# Patient Record
Sex: Female | Born: 1965
Health system: Southern US, Community
[De-identification: ages and names within clinical notes are randomized; demographics above are authoritative.]

## PROBLEM LIST (undated history)

## (undated) DIAGNOSIS — B20 Human immunodeficiency virus [HIV] disease: Secondary | ICD-10-CM

## (undated) DIAGNOSIS — I10 Essential (primary) hypertension: Secondary | ICD-10-CM

## (undated) DIAGNOSIS — U071 COVID-19: Secondary | ICD-10-CM

## (undated) DIAGNOSIS — I639 Cerebral infarction, unspecified: Secondary | ICD-10-CM

## (undated) DIAGNOSIS — K219 Gastro-esophageal reflux disease without esophagitis: Secondary | ICD-10-CM

## (undated) DIAGNOSIS — B259 Cytomegaloviral disease, unspecified: Secondary | ICD-10-CM

## (undated) DIAGNOSIS — T7840XA Allergy, unspecified, initial encounter: Secondary | ICD-10-CM

## (undated) DIAGNOSIS — H3090 Unspecified chorioretinal inflammation, unspecified eye: Secondary | ICD-10-CM

## (undated) DIAGNOSIS — Z21 Asymptomatic human immunodeficiency virus [HIV] infection status: Secondary | ICD-10-CM

## (undated) DIAGNOSIS — E785 Hyperlipidemia, unspecified: Secondary | ICD-10-CM

## (undated) HISTORY — PX: OTHER SURGICAL HISTORY: SHX169

## (undated) HISTORY — PX: NOSE SURGERY: SHX723

## (undated) HISTORY — DX: Cytomegaloviral disease, unspecified: B25.9

## (undated) HISTORY — DX: COVID-19: U07.1

## (undated) HISTORY — DX: Gastro-esophageal reflux disease without esophagitis: K21.9

## (undated) HISTORY — PX: FRACTURE SURGERY: SHX138

## (undated) HISTORY — DX: Human immunodeficiency virus (HIV) disease: B20

## (undated) HISTORY — DX: Allergy, unspecified, initial encounter: T78.40XA

## (undated) HISTORY — PX: MANDIBLE SURGERY: SHX707

## (undated) HISTORY — DX: Asymptomatic human immunodeficiency virus (hiv) infection status: Z21

## (undated) HISTORY — DX: Essential (primary) hypertension: I10

## (undated) HISTORY — DX: Hyperlipidemia, unspecified: E78.5

## (undated) HISTORY — PX: EYE SURGERY: SHX253

## (undated) HISTORY — DX: Cerebral infarction, unspecified: I63.9

## (undated) HISTORY — DX: Unspecified chorioretinal inflammation, unspecified eye: H30.90

---

## 1994-07-06 ENCOUNTER — Encounter (INDEPENDENT_AMBULATORY_CARE_PROVIDER_SITE_OTHER): Payer: Self-pay | Admitting: *Deleted

## 1994-07-06 LAB — CONVERTED CEMR LAB: CD4 T Cell Abs: 50

## 1997-12-01 ENCOUNTER — Encounter: Admission: RE | Admit: 1997-12-01 | Discharge: 1997-12-01 | Payer: Self-pay | Admitting: Infectious Diseases

## 1997-12-16 ENCOUNTER — Encounter: Admission: RE | Admit: 1997-12-16 | Discharge: 1997-12-16 | Payer: Self-pay | Admitting: Infectious Diseases

## 1998-03-10 ENCOUNTER — Encounter: Admission: RE | Admit: 1998-03-10 | Discharge: 1998-03-10 | Payer: Self-pay | Admitting: Infectious Diseases

## 1998-03-24 ENCOUNTER — Encounter: Admission: RE | Admit: 1998-03-24 | Discharge: 1998-03-24 | Payer: Self-pay | Admitting: Infectious Diseases

## 1998-04-28 ENCOUNTER — Encounter: Admission: RE | Admit: 1998-04-28 | Discharge: 1998-04-28 | Payer: Self-pay | Admitting: Infectious Diseases

## 1998-06-29 ENCOUNTER — Encounter: Admission: RE | Admit: 1998-06-29 | Discharge: 1998-06-29 | Payer: Self-pay | Admitting: Infectious Diseases

## 1998-08-11 ENCOUNTER — Encounter: Admission: RE | Admit: 1998-08-11 | Discharge: 1998-08-11 | Payer: Self-pay | Admitting: Infectious Diseases

## 1998-08-22 ENCOUNTER — Ambulatory Visit (HOSPITAL_COMMUNITY): Admission: RE | Admit: 1998-08-22 | Discharge: 1998-08-23 | Payer: Self-pay | Admitting: Ophthalmology

## 1998-09-26 ENCOUNTER — Ambulatory Visit (HOSPITAL_COMMUNITY): Admission: RE | Admit: 1998-09-26 | Discharge: 1998-09-27 | Payer: Self-pay | Admitting: Ophthalmology

## 1998-12-08 ENCOUNTER — Ambulatory Visit (HOSPITAL_COMMUNITY): Admission: RE | Admit: 1998-12-08 | Discharge: 1998-12-08 | Payer: Self-pay | Admitting: Infectious Diseases

## 1998-12-29 ENCOUNTER — Encounter: Admission: RE | Admit: 1998-12-29 | Discharge: 1998-12-29 | Payer: Self-pay | Admitting: Infectious Diseases

## 1999-02-10 ENCOUNTER — Other Ambulatory Visit: Admission: RE | Admit: 1999-02-10 | Discharge: 1999-02-10 | Payer: Self-pay | Admitting: Obstetrics and Gynecology

## 1999-05-17 ENCOUNTER — Ambulatory Visit (HOSPITAL_COMMUNITY): Admission: RE | Admit: 1999-05-17 | Discharge: 1999-05-17 | Payer: Self-pay | Admitting: Infectious Diseases

## 1999-05-17 ENCOUNTER — Encounter: Admission: RE | Admit: 1999-05-17 | Discharge: 1999-05-17 | Payer: Self-pay | Admitting: Infectious Diseases

## 1999-07-17 ENCOUNTER — Encounter: Admission: RE | Admit: 1999-07-17 | Discharge: 1999-07-17 | Payer: Self-pay | Admitting: Infectious Diseases

## 1999-08-10 ENCOUNTER — Encounter: Admission: RE | Admit: 1999-08-10 | Discharge: 1999-08-10 | Payer: Self-pay | Admitting: Infectious Diseases

## 1999-09-29 ENCOUNTER — Encounter: Payer: Self-pay | Admitting: Infectious Diseases

## 1999-09-29 ENCOUNTER — Ambulatory Visit (HOSPITAL_COMMUNITY): Admission: RE | Admit: 1999-09-29 | Discharge: 1999-09-29 | Payer: Self-pay | Admitting: Infectious Diseases

## 2000-01-09 ENCOUNTER — Encounter: Admission: RE | Admit: 2000-01-09 | Discharge: 2000-01-09 | Payer: Self-pay | Admitting: Infectious Diseases

## 2000-01-09 ENCOUNTER — Ambulatory Visit (HOSPITAL_COMMUNITY): Admission: RE | Admit: 2000-01-09 | Discharge: 2000-01-09 | Payer: Self-pay | Admitting: Infectious Diseases

## 2000-01-25 ENCOUNTER — Encounter: Admission: RE | Admit: 2000-01-25 | Discharge: 2000-01-25 | Payer: Self-pay | Admitting: Infectious Diseases

## 2000-03-08 ENCOUNTER — Other Ambulatory Visit: Admission: RE | Admit: 2000-03-08 | Discharge: 2000-03-08 | Payer: Self-pay | Admitting: *Deleted

## 2000-04-15 ENCOUNTER — Ambulatory Visit (HOSPITAL_COMMUNITY): Admission: RE | Admit: 2000-04-15 | Discharge: 2000-04-15 | Payer: Self-pay | Admitting: *Deleted

## 2000-06-10 ENCOUNTER — Encounter: Payer: Self-pay | Admitting: *Deleted

## 2000-06-10 ENCOUNTER — Ambulatory Visit (HOSPITAL_COMMUNITY): Admission: RE | Admit: 2000-06-10 | Discharge: 2000-06-10 | Payer: Self-pay | Admitting: *Deleted

## 2000-07-08 ENCOUNTER — Encounter: Admission: RE | Admit: 2000-07-08 | Discharge: 2000-07-08 | Payer: Self-pay | Admitting: Infectious Diseases

## 2000-07-09 ENCOUNTER — Ambulatory Visit (HOSPITAL_COMMUNITY): Admission: RE | Admit: 2000-07-09 | Discharge: 2000-07-09 | Payer: Self-pay | Admitting: Infectious Diseases

## 2000-07-09 ENCOUNTER — Encounter: Admission: RE | Admit: 2000-07-09 | Discharge: 2000-07-09 | Payer: Self-pay | Admitting: Infectious Diseases

## 2000-08-22 ENCOUNTER — Encounter: Admission: RE | Admit: 2000-08-22 | Discharge: 2000-08-22 | Payer: Self-pay | Admitting: Infectious Diseases

## 2000-09-06 ENCOUNTER — Ambulatory Visit (HOSPITAL_BASED_OUTPATIENT_CLINIC_OR_DEPARTMENT_OTHER): Admission: RE | Admit: 2000-09-06 | Discharge: 2000-09-06 | Payer: Self-pay | Admitting: Otolaryngology

## 2000-09-06 ENCOUNTER — Encounter (INDEPENDENT_AMBULATORY_CARE_PROVIDER_SITE_OTHER): Payer: Self-pay

## 2000-11-05 ENCOUNTER — Emergency Department (HOSPITAL_COMMUNITY): Admission: EM | Admit: 2000-11-05 | Discharge: 2000-11-05 | Payer: Self-pay | Admitting: Emergency Medicine

## 2000-11-05 ENCOUNTER — Encounter: Payer: Self-pay | Admitting: Emergency Medicine

## 2000-12-16 ENCOUNTER — Encounter: Payer: Self-pay | Admitting: Infectious Diseases

## 2000-12-16 ENCOUNTER — Ambulatory Visit (HOSPITAL_COMMUNITY): Admission: RE | Admit: 2000-12-16 | Discharge: 2000-12-16 | Payer: Self-pay | Admitting: Infectious Diseases

## 2000-12-19 ENCOUNTER — Encounter: Admission: RE | Admit: 2000-12-19 | Discharge: 2000-12-19 | Payer: Self-pay | Admitting: Infectious Diseases

## 2000-12-19 ENCOUNTER — Ambulatory Visit: Admission: RE | Admit: 2000-12-19 | Discharge: 2000-12-19 | Payer: Self-pay | Admitting: Internal Medicine

## 2001-01-02 ENCOUNTER — Encounter: Admission: RE | Admit: 2001-01-02 | Discharge: 2001-01-02 | Payer: Self-pay | Admitting: Infectious Diseases

## 2001-03-10 ENCOUNTER — Other Ambulatory Visit: Admission: RE | Admit: 2001-03-10 | Discharge: 2001-03-10 | Payer: Self-pay | Admitting: *Deleted

## 2001-07-31 ENCOUNTER — Encounter: Admission: RE | Admit: 2001-07-31 | Discharge: 2001-07-31 | Payer: Self-pay | Admitting: Infectious Diseases

## 2001-07-31 ENCOUNTER — Ambulatory Visit (HOSPITAL_COMMUNITY): Admission: RE | Admit: 2001-07-31 | Discharge: 2001-07-31 | Payer: Self-pay | Admitting: Infectious Diseases

## 2001-08-21 ENCOUNTER — Encounter: Admission: RE | Admit: 2001-08-21 | Discharge: 2001-08-21 | Payer: Self-pay | Admitting: Infectious Diseases

## 2001-11-13 ENCOUNTER — Encounter: Admission: RE | Admit: 2001-11-13 | Discharge: 2001-11-13 | Payer: Self-pay | Admitting: Internal Medicine

## 2001-11-13 ENCOUNTER — Ambulatory Visit (HOSPITAL_COMMUNITY): Admission: RE | Admit: 2001-11-13 | Discharge: 2001-11-13 | Payer: Self-pay | Admitting: Infectious Diseases

## 2001-11-27 ENCOUNTER — Encounter: Admission: RE | Admit: 2001-11-27 | Discharge: 2001-11-27 | Payer: Self-pay | Admitting: Infectious Diseases

## 2002-04-02 ENCOUNTER — Ambulatory Visit (HOSPITAL_COMMUNITY): Admission: RE | Admit: 2002-04-02 | Discharge: 2002-04-02 | Payer: Self-pay | Admitting: Infectious Diseases

## 2002-04-02 ENCOUNTER — Encounter: Admission: RE | Admit: 2002-04-02 | Discharge: 2002-04-02 | Payer: Self-pay | Admitting: Infectious Diseases

## 2002-04-16 ENCOUNTER — Encounter: Admission: RE | Admit: 2002-04-16 | Discharge: 2002-04-16 | Payer: Self-pay | Admitting: Infectious Diseases

## 2002-05-14 ENCOUNTER — Encounter: Admission: RE | Admit: 2002-05-14 | Discharge: 2002-05-14 | Payer: Self-pay | Admitting: Infectious Diseases

## 2002-06-11 ENCOUNTER — Other Ambulatory Visit: Admission: RE | Admit: 2002-06-11 | Discharge: 2002-06-11 | Payer: Self-pay | Admitting: *Deleted

## 2002-07-23 ENCOUNTER — Ambulatory Visit (HOSPITAL_COMMUNITY): Admission: RE | Admit: 2002-07-23 | Discharge: 2002-07-23 | Payer: Self-pay | Admitting: Infectious Diseases

## 2002-07-23 ENCOUNTER — Encounter: Admission: RE | Admit: 2002-07-23 | Discharge: 2002-07-23 | Payer: Self-pay | Admitting: Infectious Diseases

## 2002-08-20 ENCOUNTER — Encounter: Admission: RE | Admit: 2002-08-20 | Discharge: 2002-08-20 | Payer: Self-pay | Admitting: Infectious Diseases

## 2003-01-07 ENCOUNTER — Encounter: Payer: Self-pay | Admitting: Infectious Diseases

## 2003-01-07 ENCOUNTER — Encounter: Admission: RE | Admit: 2003-01-07 | Discharge: 2003-01-07 | Payer: Self-pay | Admitting: Infectious Diseases

## 2003-01-21 ENCOUNTER — Encounter: Admission: RE | Admit: 2003-01-21 | Discharge: 2003-01-21 | Payer: Self-pay | Admitting: Infectious Diseases

## 2003-06-07 ENCOUNTER — Other Ambulatory Visit: Admission: RE | Admit: 2003-06-07 | Discharge: 2003-06-07 | Payer: Self-pay | Admitting: Infectious Diseases

## 2003-06-21 ENCOUNTER — Encounter: Admission: RE | Admit: 2003-06-21 | Discharge: 2003-06-21 | Payer: Self-pay | Admitting: Infectious Diseases

## 2003-07-21 ENCOUNTER — Other Ambulatory Visit: Admission: RE | Admit: 2003-07-21 | Discharge: 2003-07-21 | Payer: Self-pay | Admitting: Gynecology

## 2003-09-29 ENCOUNTER — Encounter: Admission: RE | Admit: 2003-09-29 | Discharge: 2003-12-28 | Payer: Self-pay | Admitting: Gynecology

## 2004-01-05 ENCOUNTER — Ambulatory Visit (HOSPITAL_COMMUNITY): Admission: RE | Admit: 2004-01-05 | Discharge: 2004-01-05 | Payer: Self-pay | Admitting: Infectious Diseases

## 2004-01-05 ENCOUNTER — Encounter: Admission: RE | Admit: 2004-01-05 | Discharge: 2004-01-05 | Payer: Self-pay | Admitting: Infectious Diseases

## 2004-01-28 ENCOUNTER — Encounter: Admission: RE | Admit: 2004-01-28 | Discharge: 2004-01-28 | Payer: Self-pay | Admitting: Infectious Diseases

## 2004-05-20 ENCOUNTER — Emergency Department (HOSPITAL_COMMUNITY): Admission: EM | Admit: 2004-05-20 | Discharge: 2004-05-20 | Payer: Self-pay | Admitting: Internal Medicine

## 2004-06-23 ENCOUNTER — Ambulatory Visit (HOSPITAL_COMMUNITY): Admission: RE | Admit: 2004-06-23 | Discharge: 2004-06-23 | Payer: Self-pay | Admitting: Gynecology

## 2004-09-08 ENCOUNTER — Other Ambulatory Visit: Admission: RE | Admit: 2004-09-08 | Discharge: 2004-09-08 | Payer: Self-pay | Admitting: Gynecology

## 2004-09-27 ENCOUNTER — Ambulatory Visit (HOSPITAL_COMMUNITY): Admission: RE | Admit: 2004-09-27 | Discharge: 2004-09-27 | Payer: Self-pay | Admitting: Infectious Diseases

## 2004-09-27 ENCOUNTER — Ambulatory Visit: Payer: Self-pay | Admitting: Infectious Diseases

## 2004-10-12 ENCOUNTER — Ambulatory Visit: Payer: Self-pay | Admitting: Infectious Diseases

## 2005-03-28 ENCOUNTER — Ambulatory Visit (HOSPITAL_COMMUNITY): Admission: RE | Admit: 2005-03-28 | Discharge: 2005-03-28 | Payer: Self-pay | Admitting: Infectious Diseases

## 2005-03-28 ENCOUNTER — Encounter (INDEPENDENT_AMBULATORY_CARE_PROVIDER_SITE_OTHER): Payer: Self-pay | Admitting: *Deleted

## 2005-03-28 ENCOUNTER — Ambulatory Visit: Payer: Self-pay | Admitting: Infectious Diseases

## 2005-04-19 ENCOUNTER — Ambulatory Visit: Payer: Self-pay | Admitting: Infectious Diseases

## 2005-09-12 ENCOUNTER — Other Ambulatory Visit: Admission: RE | Admit: 2005-09-12 | Discharge: 2005-09-12 | Payer: Self-pay | Admitting: Gynecology

## 2005-09-26 ENCOUNTER — Ambulatory Visit: Payer: Self-pay | Admitting: Infectious Diseases

## 2005-09-26 ENCOUNTER — Encounter (INDEPENDENT_AMBULATORY_CARE_PROVIDER_SITE_OTHER): Payer: Self-pay | Admitting: *Deleted

## 2005-09-26 LAB — CONVERTED CEMR LAB
CD4 Count: 520 microliters
HIV 1 RNA Quant: 399 copies/mL

## 2006-04-10 ENCOUNTER — Ambulatory Visit: Payer: Self-pay | Admitting: Infectious Diseases

## 2006-04-10 ENCOUNTER — Encounter (INDEPENDENT_AMBULATORY_CARE_PROVIDER_SITE_OTHER): Payer: Self-pay | Admitting: *Deleted

## 2006-04-10 ENCOUNTER — Encounter: Admission: RE | Admit: 2006-04-10 | Discharge: 2006-04-10 | Payer: Self-pay | Admitting: Infectious Diseases

## 2006-04-25 ENCOUNTER — Ambulatory Visit: Payer: Self-pay | Admitting: Infectious Diseases

## 2006-09-25 ENCOUNTER — Other Ambulatory Visit: Admission: RE | Admit: 2006-09-25 | Discharge: 2006-09-25 | Payer: Self-pay | Admitting: Gynecology

## 2006-09-30 ENCOUNTER — Encounter (INDEPENDENT_AMBULATORY_CARE_PROVIDER_SITE_OTHER): Payer: Self-pay | Admitting: *Deleted

## 2006-09-30 LAB — CONVERTED CEMR LAB

## 2006-10-13 ENCOUNTER — Encounter (INDEPENDENT_AMBULATORY_CARE_PROVIDER_SITE_OTHER): Payer: Self-pay | Admitting: *Deleted

## 2006-10-23 ENCOUNTER — Ambulatory Visit: Payer: Self-pay | Admitting: Infectious Diseases

## 2006-10-23 ENCOUNTER — Encounter: Admission: RE | Admit: 2006-10-23 | Discharge: 2006-10-23 | Payer: Self-pay | Admitting: Infectious Diseases

## 2006-10-23 LAB — CONVERTED CEMR LAB
BUN: 16 mg/dL (ref 6–23)
CO2: 23 meq/L (ref 19–32)
Cholesterol: 190 mg/dL (ref 0–200)
Creatinine, Ser: 0.86 mg/dL (ref 0.40–1.20)
Eosinophils Relative: 3 % (ref 0–5)
Glucose, Bld: 90 mg/dL (ref 70–99)
HCT: 40.3 % (ref 36.0–46.0)
HDL: 62 mg/dL (ref >39)
HIV-1 RNA Quant, Log: 2.03 — ABNORMAL HIGH (ref <1.70)
Hemoglobin: 13.4 g/dL (ref 12.0–15.0)
Lymphocytes Relative: 34 % (ref 12–46)
Lymphs Abs: 1.1 10*3/uL (ref 0.7–3.3)
MCV: 94.6 fL (ref 78.0–100.0)
Monocytes Absolute: 0.3 10*3/uL (ref 0.2–0.7)
Monocytes Relative: 9 % (ref 3–11)
RBC: 4.26 M/uL (ref 3.87–5.11)
Sodium: 139 meq/L (ref 135–145)
Total Bilirubin: 0.4 mg/dL (ref 0.3–1.2)
Total Protein: 7.2 g/dL (ref 6.0–8.3)
Triglycerides: 78 mg/dL (ref <150)
VLDL: 16 mg/dL (ref 0–40)
WBC: 3.4 10*3/uL — ABNORMAL LOW (ref 4.0–10.5)

## 2006-11-07 ENCOUNTER — Ambulatory Visit: Payer: Self-pay | Admitting: Infectious Diseases

## 2006-11-07 DIAGNOSIS — I110 Hypertensive heart disease with heart failure: Secondary | ICD-10-CM

## 2006-11-07 DIAGNOSIS — B20 Human immunodeficiency virus [HIV] disease: Secondary | ICD-10-CM | POA: Insufficient documentation

## 2006-11-07 DIAGNOSIS — I509 Heart failure, unspecified: Secondary | ICD-10-CM | POA: Insufficient documentation

## 2006-11-07 DIAGNOSIS — Z21 Asymptomatic human immunodeficiency virus [HIV] infection status: Secondary | ICD-10-CM | POA: Insufficient documentation

## 2006-11-07 HISTORY — DX: Hypertensive heart disease with heart failure: I50.9

## 2006-11-07 HISTORY — DX: Hypertensive heart disease with heart failure: I11.0

## 2007-04-23 ENCOUNTER — Encounter: Admission: RE | Admit: 2007-04-23 | Discharge: 2007-04-23 | Payer: Self-pay | Admitting: Infectious Diseases

## 2007-04-23 ENCOUNTER — Other Ambulatory Visit: Admission: RE | Admit: 2007-04-23 | Discharge: 2007-04-23 | Payer: Self-pay | Admitting: Gynecology

## 2007-04-23 ENCOUNTER — Ambulatory Visit: Payer: Self-pay | Admitting: Infectious Diseases

## 2007-04-23 LAB — CONVERTED CEMR LAB
ALT: 13 units/L (ref 0–35)
AST: 18 units/L (ref 0–37)
Albumin: 4.4 g/dL (ref 3.5–5.2)
Alkaline Phosphatase: 48 units/L (ref 39–117)
BUN: 20 mg/dL (ref 6–23)
Basophils Absolute: 0 10*3/uL (ref 0.0–0.1)
Basophils Relative: 1 % (ref 0–1)
CO2: 25 meq/L (ref 19–32)
Calcium: 9.1 mg/dL (ref 8.4–10.5)
Chloride: 104 meq/L (ref 96–112)
Creatinine, Ser: 0.84 mg/dL (ref 0.40–1.20)
Eosinophils Absolute: 0.1 10*3/uL (ref 0.0–0.7)
Eosinophils Relative: 2 % (ref 0–5)
Glucose, Bld: 124 mg/dL — ABNORMAL HIGH (ref 70–99)
HCT: 39.5 % (ref 36.0–46.0)
HIV 1 RNA Quant: 68 copies/mL — ABNORMAL HIGH (ref <50)
Hemoglobin: 13.2 g/dL (ref 12.0–15.0)
Lymphocytes Relative: 34 % (ref 12–46)
Lymphs Abs: 1 10*3/uL (ref 0.7–3.3)
MCHC: 33.4 g/dL (ref 30.0–36.0)
MCV: 93.4 fL (ref 78.0–100.0)
Monocytes Absolute: 0.2 10*3/uL (ref 0.2–0.7)
Monocytes Relative: 7 % (ref 3–11)
Neutro Abs: 1.7 10*3/uL (ref 1.7–7.7)
Neutrophils Relative %: 56 % (ref 43–77)
Platelets: 186 10*3/uL (ref 150–400)
Potassium: 4.2 meq/L (ref 3.5–5.3)
RBC: 4.23 M/uL (ref 3.87–5.11)
RDW: 13 % (ref 11.5–14.0)
Sodium: 139 meq/L (ref 135–145)
Total Bilirubin: 0.4 mg/dL (ref 0.3–1.2)
Total Protein: 7.1 g/dL (ref 6.0–8.3)
WBC: 3 10*3/uL — ABNORMAL LOW (ref 4.0–10.5)

## 2007-05-08 ENCOUNTER — Ambulatory Visit: Payer: Self-pay | Admitting: Infectious Diseases

## 2007-06-09 ENCOUNTER — Telehealth: Payer: Self-pay | Admitting: Infectious Diseases

## 2007-09-10 ENCOUNTER — Encounter: Admission: RE | Admit: 2007-09-10 | Discharge: 2007-09-10 | Payer: Self-pay | Admitting: Infectious Diseases

## 2007-09-10 ENCOUNTER — Ambulatory Visit: Payer: Self-pay | Admitting: Infectious Diseases

## 2007-09-10 LAB — CONVERTED CEMR LAB
BUN: 20 mg/dL (ref 6–23)
Basophils Relative: 1 % (ref 0–1)
Chloride: 106 meq/L (ref 96–112)
Glucose, Bld: 85 mg/dL (ref 70–99)
HIV-1 RNA Quant, Log: <1.7 (ref <1.70)
LDL Cholesterol: 111 mg/dL — ABNORMAL HIGH (ref 0–99)
Lymphs Abs: 1.1 10*3/uL (ref 0.7–4.0)
Monocytes Relative: 8 % (ref 3–12)
Neutro Abs: 1.7 10*3/uL (ref 1.7–7.7)
Neutrophils Relative %: 54 % (ref 43–77)
Potassium: 4 meq/L (ref 3.5–5.3)
RBC: 4.2 M/uL (ref 3.87–5.11)
Triglycerides: 98 mg/dL (ref <150)
VLDL: 20 mg/dL (ref 0–40)
WBC: 3.2 10*3/uL — ABNORMAL LOW (ref 4.0–10.5)

## 2007-09-30 ENCOUNTER — Encounter (INDEPENDENT_AMBULATORY_CARE_PROVIDER_SITE_OTHER): Payer: Self-pay | Admitting: *Deleted

## 2007-10-02 ENCOUNTER — Ambulatory Visit: Payer: Self-pay | Admitting: Infectious Diseases

## 2007-10-08 ENCOUNTER — Other Ambulatory Visit: Admission: RE | Admit: 2007-10-08 | Discharge: 2007-10-08 | Payer: Self-pay | Admitting: Gynecology

## 2007-11-25 ENCOUNTER — Ambulatory Visit (HOSPITAL_COMMUNITY): Admission: RE | Admit: 2007-11-25 | Discharge: 2007-11-25 | Payer: Self-pay | Admitting: Gynecology

## 2008-01-12 ENCOUNTER — Telehealth (INDEPENDENT_AMBULATORY_CARE_PROVIDER_SITE_OTHER): Payer: Self-pay | Admitting: *Deleted

## 2008-06-02 ENCOUNTER — Ambulatory Visit: Payer: Self-pay | Admitting: Infectious Diseases

## 2008-06-02 LAB — CONVERTED CEMR LAB
ALT: 16 units/L (ref 0–35)
AST: 20 units/L (ref 0–37)
Alkaline Phosphatase: 43 units/L (ref 39–117)
Basophils Absolute: 0 10*3/uL (ref 0.0–0.1)
Basophils Relative: 1 % (ref 0–1)
CO2: 23 meq/L (ref 19–32)
Eosinophils Absolute: 0.1 10*3/uL (ref 0.0–0.7)
Eosinophils Relative: 4 % (ref 0–5)
HCT: 38.2 % (ref 36.0–46.0)
Lymphocytes Relative: 35 % (ref 12–46)
MCHC: 32.7 g/dL (ref 30.0–36.0)
Platelets: 180 10*3/uL (ref 150–400)
RDW: 13 % (ref 11.5–15.5)
Sodium: 139 meq/L (ref 135–145)
Total Bilirubin: 0.3 mg/dL (ref 0.3–1.2)
Total Protein: 6.6 g/dL (ref 6.0–8.3)

## 2008-06-04 ENCOUNTER — Telehealth (INDEPENDENT_AMBULATORY_CARE_PROVIDER_SITE_OTHER): Payer: Self-pay | Admitting: *Deleted

## 2008-06-17 ENCOUNTER — Ambulatory Visit: Payer: Self-pay | Admitting: Infectious Diseases

## 2008-09-26 ENCOUNTER — Emergency Department (HOSPITAL_COMMUNITY): Admission: EM | Admit: 2008-09-26 | Discharge: 2008-09-26 | Payer: Self-pay | Admitting: Emergency Medicine

## 2008-09-29 ENCOUNTER — Ambulatory Visit (HOSPITAL_BASED_OUTPATIENT_CLINIC_OR_DEPARTMENT_OTHER): Admission: RE | Admit: 2008-09-29 | Discharge: 2008-09-29 | Payer: Self-pay | Admitting: *Deleted

## 2008-10-08 ENCOUNTER — Emergency Department (HOSPITAL_COMMUNITY): Admission: EM | Admit: 2008-10-08 | Discharge: 2008-10-08 | Payer: Self-pay | Admitting: Emergency Medicine

## 2008-11-03 ENCOUNTER — Other Ambulatory Visit: Admission: RE | Admit: 2008-11-03 | Discharge: 2008-11-03 | Payer: Self-pay | Admitting: Gynecology

## 2008-11-03 ENCOUNTER — Encounter (INDEPENDENT_AMBULATORY_CARE_PROVIDER_SITE_OTHER): Payer: Self-pay | Admitting: *Deleted

## 2008-11-03 ENCOUNTER — Encounter: Payer: Self-pay | Admitting: Women's Health

## 2008-11-03 ENCOUNTER — Ambulatory Visit: Payer: Self-pay | Admitting: Women's Health

## 2008-11-03 ENCOUNTER — Encounter: Payer: Self-pay | Admitting: Infectious Diseases

## 2008-11-22 ENCOUNTER — Encounter (INDEPENDENT_AMBULATORY_CARE_PROVIDER_SITE_OTHER): Payer: Self-pay | Admitting: *Deleted

## 2008-11-25 ENCOUNTER — Ambulatory Visit (HOSPITAL_COMMUNITY): Admission: RE | Admit: 2008-11-25 | Discharge: 2008-11-25 | Payer: Self-pay | Admitting: Gynecology

## 2008-12-01 ENCOUNTER — Ambulatory Visit: Payer: Self-pay | Admitting: Infectious Diseases

## 2008-12-01 LAB — CONVERTED CEMR LAB
Albumin: 4.1 g/dL (ref 3.5–5.2)
BUN: 19 mg/dL (ref 6–23)
Basophils Absolute: 0 10*3/uL (ref 0.0–0.1)
Calcium: 8.9 mg/dL (ref 8.4–10.5)
Chloride: 105 meq/L (ref 96–112)
Eosinophils Absolute: 0.1 10*3/uL (ref 0.0–0.7)
Eosinophils Relative: 3 % (ref 0–5)
GFR calc non Af Amer: >60 mL/min (ref >60)
Glucose, Bld: 76 mg/dL (ref 70–99)
HDL: 60 mg/dL (ref >39)
HIV-1 RNA Quant, Log: 1.81 — ABNORMAL HIGH (ref <1.68)
LDL Cholesterol: 111 mg/dL — ABNORMAL HIGH (ref 0–99)
Lymphocytes Relative: 38 % (ref 12–46)
Lymphs Abs: 1.4 10*3/uL (ref 0.7–4.0)
MCV: 96.2 fL (ref 78.0–100.0)
Neutrophils Relative %: 52 % (ref 43–77)
Platelets: 181 10*3/uL (ref 150–400)
Potassium: 4 meq/L (ref 3.5–5.3)
RDW: 12.8 % (ref 11.5–15.5)
Sodium: 139 meq/L (ref 135–145)
Total Protein: 6.7 g/dL (ref 6.0–8.3)
WBC: 3.8 10*3/uL — ABNORMAL LOW (ref 4.0–10.5)

## 2008-12-16 ENCOUNTER — Ambulatory Visit: Payer: Self-pay | Admitting: Infectious Diseases

## 2009-06-13 ENCOUNTER — Telehealth (INDEPENDENT_AMBULATORY_CARE_PROVIDER_SITE_OTHER): Payer: Self-pay | Admitting: *Deleted

## 2009-07-27 ENCOUNTER — Ambulatory Visit: Payer: Self-pay | Admitting: Infectious Diseases

## 2009-07-27 ENCOUNTER — Telehealth: Payer: Self-pay | Admitting: Infectious Diseases

## 2009-07-27 LAB — CONVERTED CEMR LAB
ALT: 16 units/L (ref 0–35)
AST: 19 units/L (ref 0–37)
Albumin: 4.3 g/dL (ref 3.5–5.2)
Alkaline Phosphatase: 38 units/L — ABNORMAL LOW (ref 39–117)
Basophils Absolute: 0 10*3/uL (ref 0.0–0.1)
Basophils Relative: 0 % (ref 0–1)
Eosinophils Absolute: 0.1 10*3/uL (ref 0.0–0.7)
Glucose, Bld: 85 mg/dL (ref 70–99)
HIV 1 RNA Quant: <48 copies/mL (ref <48)
MCHC: 35 g/dL (ref 30.0–36.0)
MCV: 93.7 fL (ref >=78.0)
Monocytes Relative: 10 % (ref 3–12)
Neutro Abs: 1.7 10*3/uL (ref 1.7–7.7)
Neutrophils Relative %: 48 % (ref 43–77)
Platelets: 196 10*3/uL (ref 150–400)
Potassium: 4 meq/L (ref 3.5–5.3)
RDW: 12.7 % (ref 11.5–15.5)
Sodium: 136 meq/L (ref 135–145)
Total Bilirubin: 0.3 mg/dL (ref 0.3–1.2)
Total Protein: 6.7 g/dL (ref 6.0–8.3)

## 2009-08-18 ENCOUNTER — Ambulatory Visit: Payer: Self-pay | Admitting: Infectious Diseases

## 2009-10-19 ENCOUNTER — Telehealth (INDEPENDENT_AMBULATORY_CARE_PROVIDER_SITE_OTHER): Payer: Self-pay | Admitting: *Deleted

## 2009-10-20 ENCOUNTER — Emergency Department (HOSPITAL_COMMUNITY): Admission: EM | Admit: 2009-10-20 | Discharge: 2009-10-20 | Payer: Self-pay | Admitting: Family Medicine

## 2009-11-28 ENCOUNTER — Ambulatory Visit (HOSPITAL_COMMUNITY): Admission: RE | Admit: 2009-11-28 | Discharge: 2009-11-28 | Payer: Self-pay | Admitting: Gynecology

## 2009-11-30 ENCOUNTER — Other Ambulatory Visit: Admission: RE | Admit: 2009-11-30 | Discharge: 2009-11-30 | Payer: Self-pay | Admitting: Gynecology

## 2009-11-30 ENCOUNTER — Ambulatory Visit: Payer: Self-pay | Admitting: Women's Health

## 2010-03-23 ENCOUNTER — Ambulatory Visit (HOSPITAL_BASED_OUTPATIENT_CLINIC_OR_DEPARTMENT_OTHER): Admission: RE | Admit: 2010-03-23 | Discharge: 2010-03-23 | Payer: Self-pay | Admitting: Orthopedic Surgery

## 2010-04-05 ENCOUNTER — Ambulatory Visit: Payer: Self-pay | Admitting: Infectious Diseases

## 2010-04-05 LAB — CONVERTED CEMR LAB
ALT: 9 units/L (ref 0–35)
AST: 16 units/L (ref 0–37)
Alkaline Phosphatase: 38 units/L — ABNORMAL LOW (ref 39–117)
BUN: 17 mg/dL (ref 6–23)
Basophils Absolute: 0 10*3/uL (ref 0.0–0.1)
Basophils Relative: 0 % (ref 0–1)
Creatinine, Ser: 0.89 mg/dL (ref 0.40–1.20)
Eosinophils Absolute: 0.1 10*3/uL (ref 0.0–0.7)
HDL: 58 mg/dL (ref >39)
LDL Cholesterol: 115 mg/dL — ABNORMAL HIGH (ref 0–99)
MCHC: 32.5 g/dL (ref 30.0–36.0)
MCV: 93.5 fL (ref 78.0–100.0)
Monocytes Absolute: 0.3 10*3/uL (ref 0.1–1.0)
Monocytes Relative: 8 % (ref 3–12)
Neutrophils Relative %: 61 % (ref 43–77)
Potassium: 3.8 meq/L (ref 3.5–5.3)
RBC: 3.69 M/uL — ABNORMAL LOW (ref 3.87–5.11)
RDW: 13.7 % (ref 11.5–15.5)
Total CHOL/HDL Ratio: 3.2

## 2010-04-20 ENCOUNTER — Ambulatory Visit: Payer: Self-pay | Admitting: Infectious Diseases

## 2010-05-06 HISTORY — PX: FINGER SURGERY: SHX640

## 2010-06-27 ENCOUNTER — Encounter (INDEPENDENT_AMBULATORY_CARE_PROVIDER_SITE_OTHER): Payer: Self-pay | Admitting: *Deleted

## 2010-08-30 ENCOUNTER — Encounter (INDEPENDENT_AMBULATORY_CARE_PROVIDER_SITE_OTHER): Payer: Self-pay | Admitting: *Deleted

## 2010-09-05 NOTE — Miscellaneous (Signed)
  Clinical Lists Changes  Observations: Added new observation of YEARAIDSPOS: 1995  (06/27/2010 14:56) Added new observation of HIV STATUS: CDC-defined AIDS  (06/27/2010 14:56)

## 2010-09-05 NOTE — Progress Notes (Signed)
Summary: severe sore throat, nightly fever, "cold" symptoms, advised UC  Phone Note Call from Patient Call back at Home Phone [phone]   Caller: Patient Reason for Call: Acute Illness Summary of Call: Severe sore throat, "cold" symptoms, night elevated temperature.  No clinic appts. available.  RN advised [facility] UC visit to rule out possibel strep throat.  Pt. verablized understanding. Jennet Maduro RN  October 19, 2009 3:09 PM

## 2010-09-05 NOTE — Assessment & Plan Note (Signed)
Summary: CHECKUP.SB.   CC:  increased anxiety, rage, and increased agitation.  Preventive Screening-Counseling & Management  Alcohol-Tobacco     Smoking Status: never     Year Quit: 1996   Current Allergies: ! PENICILLIN ! * DAPSONE Additional History Menstrual Status:  regular  Vital Signs:  Patient profile:   45 year old female Menstrual status:  regular LMP:     08/04/2009 Height:      65 inches (165.10 cm) Weight:      110 pounds (50.00 kg) BMI:     18.37 Pulse rate:   88 / minute BP sitting:   116 / 74  Vitals Entered By: Starleen Arms CMA (August 18, 2009 11:21 AM) CC: increased anxiety, rage, increased agitation Is Patient Diabetic? No Pain Assessment Patient in pain? no      Nutritional Status BMI of < 19 = underweight Nutritional Status Detail nl  Does patient need assistance? Functional Status Self care Ambulation Normal LMP (date): 08/04/2009     Menstrual Status regular Enter LMP: 08/04/2009 Last PAP Result normal    Medications Added to Medication List This Visit: 1)  Vitamin D 2000 Unit Tabs (Cholecalciferol) .Marland Kitchen.. 1 daily  Other Orders: Est. Patient Level III (56213) Future Orders: T-CBC w/Diff ([id number]) ... 01/04/2010 T-CD4SP ([facility]) (CD4SP) ... 01/04/2010 T-Comprehensive Metabolic Panel [phone]) ... 01/04/2010 T-HIV Viral Load [id number]) ... 01/04/2010 T-RPR (Syphilis) [phone]) ... 01/04/2010 T-Lipid Profile [id number]) ... 01/04/2010 Process Orders Check Orders Results:     [facility]: ABN not required for this insurance Tests Sent for requisitioning (October 27, 2009 3:24 PM):     01/04/2010: [facility] -- T-CBC w/Diff [[id number]] (signed)     01/04/2010: [facility] -- T-Comprehensive Metabolic Panel [[id number]] (signed)     01/04/2010: [facility] -- T-HIV Viral Load [phone] (signed)     01/04/2010: [facility] --  T-RPR (Syphilis) [phone] (signed)     01/04/2010: [facility] -- T-Lipid Profile [phone] (signed)

## 2010-09-07 NOTE — Miscellaneous (Signed)
Summary: Orders Update  Clinical Lists Changes 

## 2010-09-07 NOTE — Miscellaneous (Signed)
  Clinical Lists Changes  Observations: Added new observation of INCOMESOURCE: UNKNOWN (08/30/2010 12:03) Added new observation of HOUSEINCOME: 0  (08/30/2010 12:03) Added new observation of YEARLYEXPEN: 0  (08/30/2010 12:03)

## 2010-10-12 NOTE — Assessment & Plan Note (Signed)
Summary: F/U [MKJ]   CC:  f/u ov  .  Preventive Screening-Counseling & Management  Alcohol-Tobacco     Alcohol drinks/day: socially     Smoking Status: never     Year Quit: 1996   Current Allergies (reviewed today): ! PENICILLIN ! * DAPSONE Vital Signs:  Patient profile:   45 year old female Menstrual status:  regular Height:      65 inches Weight:      112.4 pounds BMI:     18.77 BSA:     1.55 Temp:     98.3 degrees F oral Pulse rate:   84 / minute BP sitting:   134 / 88  (right arm)  Vitals Entered By: Starleen Arms CMA (April 20, 2010 2:41 PM) CC: f/u ov   Is Patient Diabetic? No Pain Assessment Patient in pain? no      Nutritional Status BMI of 19 -24 = normal Nutritional Status Detail normal  Have you ever been in a relationship where you felt threatened, hurt or afraid?No  Domestic Violence Intervention none  Does patient need assistance? Functional Status Self care Ambulation Normal    Other Orders: Flu Vaccine 79yrs + (74259) Admin 1st Vaccine (56387) Est. Patient Level III (56433) Future Orders: T-CBC w/Diff ([id number]) ... 10/19/2010 T-CD4SP ([facility]) (CD4SP) ... 10/19/2010 T-HIV Viral Load [id number]) ... 10/19/2010 T-Comprehensive Metabolic Panel [id number]) ... 10/19/2010  Patient Instructions: 1)  Please schedule a follow-up appointment in 6 months. 2)  Be sure to return for lab work one (2) week before your next appointment as scheduled. Prescriptions: BENAZEPRIL-HYDROCHLOROTHIAZIDE 10-12.5 MG TABS (BENAZEPRIL-HYDROCHLOROTHIAZIDE) Take 1 tablet by mouth once a day  #30 Each x 10   Entered by:   Starleen Arms CMA   Authorized by:   Lina Sayre MD   Signed by:   Starleen Arms CMA on 04/20/2010   Method used:   Print then Give to Patient   RxID:   [id number] ATRIPLA 600-200-300 MG TABS (EFAVIRENZ-EMTRICITAB-TENOFOVIR) Take 1 tablet by mouth at bedtime  #30 x 11   Entered by:   Starleen Arms CMA  Authorized by:   Lina Sayre MD   Signed by:   Starleen Arms CMA on 04/20/2010   Method used:   Print then Give to Patient   RxID:   [id number]    Immunizations Administered:  Influenza Vaccine # 1:    Vaccine Type: Fluvax 3+    Site: left deltoid    Mfr: novartis    Dose: 0.5 ml    Route: IM    Given by: Starleen Arms CMA    Exp. Date: 11/05/2010    Lot #: [id number]    VIS given: 02/28/10 version given April 20, 2010.  Flu Vaccine Consent Questions:    Do you have a history of severe allergic reactions to this vaccine? no    Any prior history of allergic reactions to egg and/or gelatin? no    Do you have a sensitivity to the preservative Thimersol? no    Do you have a past history of Guillan-Barre Syndrome? no    Do you currently have an acute febrile illness? no    Have you ever had a severe reaction to latex? no    Vaccine information given and explained to patient? yes    Are you currently pregnant? no  Process Orders Check Orders Results:     [facility]: ABN not required for this insurance Tests Sent for requisitioning (October 03, 2010 10:26 AM):  10/19/2010: [facility] -- T-CBC w/Diff [[id number]] (signed)     10/19/2010: [facility] -- T-HIV Viral Load [phone] (signed)     10/19/2010: [facility] -- T-Comprehensive Metabolic Panel [phone] (signed)

## 2010-10-19 LAB — CBC
MCH: 31.4 pg (ref 26.0–34.0)
MCHC: 34.1 g/dL (ref 30.0–36.0)
MCV: 92.2 fL (ref 78.0–100.0)
Platelets: 195 10*3/uL (ref 150–400)
RDW: 13.9 % (ref 11.5–15.5)

## 2010-10-19 LAB — COMPREHENSIVE METABOLIC PANEL
Albumin: 3.9 g/dL (ref 3.5–5.2)
BUN: 13 mg/dL (ref 6–23)
Calcium: 9 mg/dL (ref 8.4–10.5)
Creatinine, Ser: 0.93 mg/dL (ref 0.4–1.2)
Total Bilirubin: 0.7 mg/dL (ref 0.3–1.2)
Total Protein: 6.7 g/dL (ref 6.0–8.3)

## 2010-10-19 LAB — DIFFERENTIAL
Basophils Absolute: 0 10*3/uL (ref 0.0–0.1)
Lymphocytes Relative: 29 % (ref 12–46)
Lymphs Abs: 1.1 10*3/uL (ref 0.7–4.0)
Monocytes Absolute: 0.3 10*3/uL (ref 0.1–1.0)
Monocytes Relative: 8 % (ref 3–12)
Neutro Abs: 2.2 10*3/uL (ref 1.7–7.7)

## 2010-11-15 LAB — T-HELPER CELL (CD4) - (RCID CLINIC ONLY)
CD4 % Helper T Cell: 45 % (ref 33–55)
CD4 T Cell Abs: 590 uL (ref 400–2700)

## 2010-11-21 LAB — BASIC METABOLIC PANEL
GFR calc Af Amer: >60 mL/min (ref >60)
GFR calc non Af Amer: >60 mL/min (ref >60)
Glucose, Bld: 83 mg/dL (ref 70–99)
Potassium: 4.1 mEq/L (ref 3.5–5.1)
Sodium: 139 mEq/L (ref 135–145)

## 2010-11-21 LAB — POCT HEMOGLOBIN-HEMACUE: Hemoglobin: 12.9 g/dL (ref 12.0–15.0)

## 2010-12-13 ENCOUNTER — Other Ambulatory Visit (HOSPITAL_COMMUNITY)
Admission: RE | Admit: 2010-12-13 | Discharge: 2010-12-13 | Disposition: A | Discharge status: Home / Self Care | Payer: 59 | Source: Ambulatory Visit | Attending: Gynecology | Admitting: Gynecology

## 2010-12-13 ENCOUNTER — Other Ambulatory Visit: Payer: Self-pay | Admitting: Women's Health

## 2010-12-13 ENCOUNTER — Encounter (INDEPENDENT_AMBULATORY_CARE_PROVIDER_SITE_OTHER): Payer: 59 | Admitting: Women's Health

## 2010-12-13 DIAGNOSIS — Z01419 Encounter for gynecological examination (general) (routine) without abnormal findings: Secondary | ICD-10-CM

## 2010-12-13 DIAGNOSIS — Z124 Encounter for screening for malignant neoplasm of cervix: Secondary | ICD-10-CM | POA: Insufficient documentation

## 2010-12-19 NOTE — Op Note (Signed)
NAMECOLIE, JOSTEN                 ACCOUNT NO.:  [id number]   MEDICAL RECORD NO.:  [id number]          PATIENT TYPE:  AMB   LOCATION:  [facility]                          FACILITY:  [facility]   PHYSICIAN:  Lowell Bouton, M.D.DATE OF BIRTH:  1966-02-10   DATE OF PROCEDURE:  09/29/2008  DATE OF DISCHARGE:                               OPERATIVE REPORT   PREOPERATIVE DIAGNOSIS:  Proximal phalanx fracture, right index finger.   POSTOPERATIVE DIAGNOSIS:  Proximal phalanx fracture, right index finger.   PROCEDURE:  Open reduction and internal fixation, proximal phalanx,  right index finger.   SURGEON:  Lowell Bouton, MD   ANESTHESIA:  General.   OPERATIVE FINDINGS:  The patient had an oblique fracture of the proximal  phalanx that was displaced.   PROCEDURE IN DETAIL:  Under general anesthesia with a tourniquet on the  right arm, the right hand was prepped and draped in the usual fashion  and after explaining the limb, the tourniquet was inflated to 250 mmHg.  A longitudinal incision was made over the dorsum of the index finger,  proximal phalanx and carried through the subcutaneous tissues.  Sharp  dissection was carried down through the center of the extensor tendon to  the bone.  A Freer elevator was used to elevate the periosteum and the  fracture site was identified.  It was irrigated and reduced.  An Ikuta  clamp was used to hold the fracture temporarily and the modular handset  was used for fixation.  The 2.0-mm screws were used and were drilled  across the fracture perpendicular to the fracture site, using 2 screws  parallel to each other.  X-rays showed good alignment and the fracture  was stable clinically.  The wound was irrigated copiously with saline.  The extensor mechanism was repaired with 4-0 Mersilene, the skin with a  3-0 subcuticular Prolene, and Steri-Strips were applied, and 0.5%  Marcaine digital block was placed for pain control.  Sterile  dressings  were applied.  The patient tolerated the procedure well and went to the  recovery room awake in stable and in good condition.      Lowell Bouton, M.D.  Electronically Signed     EMM/MEDQ  D:  09/29/2008  T:  09/30/2008  Job:  [id number]

## 2010-12-21 ENCOUNTER — Other Ambulatory Visit: Payer: 59

## 2010-12-22 NOTE — Op Note (Signed)
[place]  Patient:    Cynthia [name], Cynthia [name]                        MRN: [id number] Proc. Date: 09/06/00 Adm. Date:  [id number] Attending:  Carlean Purl                           Operative Report  PREOPERATIVE DIAGNOSIS:  Left maxillary cyst.  POSTOPERATIVE DIAGNOSIS:  Left maxillary cyst.  PROCEDURE:  Left Caldwell-Luc procedure with removal of a left maxillary cyst.  SURGEON:  Kristine Garbe. Ezzard Standing, M.D.  ANESTHESIA:  General endotracheal.  COMPLICATIONS:  None.  BRIEF CLINICAL NOTE:  Cynthia [name] is a 45 year old female who is HIV-positive. She has had a slowly enlarging left cheek mass for several months now.  She had a CT scan, which demonstrates a large cyst in the maxillary bone, really anterior to the maxillary sinus, with a very undeveloped left maxillary sinus. She has had previous surgery where she had maxillary advancement.  I suspect the cyst is related to her previous surgery several years ago.  She is taken to the operating room at this time for left Caldwell-Luc procedure to remove the left maxillary cyst.  DESCRIPTION OF PROCEDURE:  After adequate endotracheal anesthesia, the patient received 1 g of Ancef intraoperatively.  Left cheek area was injected with Xylocaine with epinephrine for hemostasis.  Nose was examined first after decongesting the nose, and the nasal passages were clear.  There were no polyps, no obstructing lesions.  The left middle turbinate was adherent to the lateral wall, but there was no obvious mass in the left middle meatus-left middle turbinate region.  Following this, a Caldwell-Luc incision was made in the left sublabial area.  Dissection was carried down to the maxillary bone. A large cyst was encountered, extruding from the maxilla on the left side. The cyst was outlined, and the cyst was removed from the maxillary bone, and it was a smooth-walled cyst with thick yellow-golden fluid inside  the cyst. The cyst wall was carefully removed from the bone.  A portion of the bone around the opening of the cyst was enlarged with the rongeurs.  After the cyst was removed, it was sent to pathology.  There was no obvious communication to any of the dental roots.  A previously-placed wire in the maxilla was removed. After removing the cyst, hemostasis was obtained with the cautery.  An inferior meatal antrotomy was performed to drain the cavity into the inferior meatus into the nasal cavity.  Hemostasis was obtained with the cautery.  This completed the procedure.  the sublabial incision was closed with 4-0 chromic suture.  This completed the procedure.  Cynthia [name] was awoken from anesthsia and transferred to recovery postoperatively doing well.  DISPOSITION:  Cynthia [name] is discharged home later this morning on Tylenol and Tylenol No. 3 p.r.n. pain, along with Keflex 500 mg b.i.d. for one week.  She will follow up in my office in 10 days for recheck. DD:  09/06/00 TD:  09/06/00 Job: [id number]

## 2011-01-30 ENCOUNTER — Other Ambulatory Visit: Payer: Self-pay | Admitting: Infectious Diseases

## 2011-02-08 ENCOUNTER — Other Ambulatory Visit (INDEPENDENT_AMBULATORY_CARE_PROVIDER_SITE_OTHER): Payer: 59

## 2011-02-08 DIAGNOSIS — Z79899 Other long term (current) drug therapy: Secondary | ICD-10-CM

## 2011-02-08 DIAGNOSIS — B20 Human immunodeficiency virus [HIV] disease: Secondary | ICD-10-CM

## 2011-02-08 DIAGNOSIS — Z113 Encounter for screening for infections with a predominantly sexual mode of transmission: Secondary | ICD-10-CM

## 2011-02-09 LAB — CBC WITH DIFFERENTIAL/PLATELET
Hemoglobin: 11.6 g/dL — ABNORMAL LOW (ref 12.0–15.0)
Lymphocytes Relative: 41 % (ref 12–46)
Lymphs Abs: 1.6 10*3/uL (ref 0.7–4.0)
Monocytes Relative: 10 % (ref 3–12)
Neutro Abs: 1.8 10*3/uL (ref 1.7–7.7)
Neutrophils Relative %: 47 % (ref 43–77)
Platelets: 188 10*3/uL (ref 150–400)
RBC: 3.87 MIL/uL (ref 3.87–5.11)
WBC: 3.9 10*3/uL — ABNORMAL LOW (ref 4.0–10.5)

## 2011-02-09 LAB — COMPLETE METABOLIC PANEL WITHOUT GFR
ALT: 16 U/L (ref 0–35)
AST: 20 U/L (ref 0–37)
Albumin: 4.4 g/dL (ref 3.5–5.2)
Alkaline Phosphatase: 43 U/L (ref 39–117)
BUN: 16 mg/dL (ref 6–23)
CO2: 23 meq/L (ref 19–32)
Calcium: 9.2 mg/dL (ref 8.4–10.5)
Chloride: 103 meq/L (ref 96–112)
Creat: 0.88 mg/dL (ref 0.50–1.10)
GFR, Est African American: >60 mL/min
GFR, Est Non African American: >60 mL/min
Glucose, Bld: 96 mg/dL (ref 70–99)
Potassium: 4.1 meq/L (ref 3.5–5.3)
Sodium: 135 meq/L (ref 135–145)
Total Bilirubin: 0.4 mg/dL (ref 0.3–1.2)
Total Protein: 7 g/dL (ref 6.0–8.3)

## 2011-02-09 LAB — HIV-1 RNA QUANT-NO REFLEX-BLD
HIV 1 RNA Quant: <20 {copies}/mL
HIV-1 RNA Quant, Log: <1.3 {Log}

## 2011-02-09 LAB — LIPID PANEL
Cholesterol: 206 mg/dL — ABNORMAL HIGH (ref 0–200)
LDL Cholesterol: 133 mg/dL — ABNORMAL HIGH (ref 0–99)
Total CHOL/HDL Ratio: 3.4 Ratio
Triglycerides: 63 mg/dL (ref <150)
VLDL: 13 mg/dL (ref 0–40)

## 2011-02-09 LAB — GC/CHLAMYDIA PROBE AMP, URINE: GC Probe Amp, Urine: NEGATIVE

## 2011-02-09 LAB — SYPHILIS: RPR W/REFLEX TO RPR TITER AND TREPONEMAL ANTIBODIES, TRADITIONAL SCREENING AND DIAGNOSIS ALGORITHM

## 2011-02-27 ENCOUNTER — Encounter: Payer: Self-pay | Admitting: Infectious Diseases

## 2011-02-27 ENCOUNTER — Ambulatory Visit (INDEPENDENT_AMBULATORY_CARE_PROVIDER_SITE_OTHER): Payer: 59 | Admitting: Infectious Diseases

## 2011-02-27 VITALS — BP 132/84 | HR 83 | Temp 97.8°F | Wt 112.0 lb

## 2011-02-27 DIAGNOSIS — B20 Human immunodeficiency virus [HIV] disease: Secondary | ICD-10-CM

## 2011-02-27 MED ORDER — EFAVIRENZ-EMTRICITAB-TENOFOVIR 600-200-300 MG PO TABS: 1.0000 | ORAL_TABLET | Freq: Every day | ORAL | Status: DC

## 2011-02-28 ENCOUNTER — Other Ambulatory Visit: Payer: Self-pay | Admitting: Gynecology

## 2011-02-28 DIAGNOSIS — Z1231 Encounter for screening mammogram for malignant neoplasm of breast: Secondary | ICD-10-CM

## 2011-03-01 ENCOUNTER — Ambulatory Visit: Payer: 59 | Admitting: Infectious Diseases

## 2011-03-06 ENCOUNTER — Ambulatory Visit (HOSPITAL_COMMUNITY)
Admission: RE | Admit: 2011-03-06 | Discharge: 2011-03-06 | Disposition: A | Discharge status: Home / Self Care | Payer: 59 | Source: Ambulatory Visit | Attending: Gynecology | Admitting: Gynecology

## 2011-03-06 DIAGNOSIS — Z1231 Encounter for screening mammogram for malignant neoplasm of breast: Secondary | ICD-10-CM | POA: Insufficient documentation

## 2011-04-27 LAB — T-HELPER CELL (CD4) - (RCID CLINIC ONLY)
CD4 % Helper T Cell: 45
CD4 T Cell Abs: 450

## 2011-05-08 LAB — T-HELPER CELL (CD4) - (RCID CLINIC ONLY): CD4 % Helper T Cell: 44

## 2011-05-17 LAB — T-HELPER CELL (CD4) - (RCID CLINIC ONLY)
CD4 % Helper T Cell: 42
CD4 T Cell Abs: 390 — ABNORMAL LOW

## 2011-08-23 DIAGNOSIS — H33021 Retinal detachment with multiple breaks, right eye: Secondary | ICD-10-CM | POA: Insufficient documentation

## 2011-08-23 DIAGNOSIS — Z961 Presence of intraocular lens: Secondary | ICD-10-CM | POA: Insufficient documentation

## 2011-08-28 ENCOUNTER — Other Ambulatory Visit: Payer: Self-pay | Admitting: *Deleted

## 2011-08-28 ENCOUNTER — Telehealth: Payer: Self-pay | Admitting: *Deleted

## 2011-08-28 DIAGNOSIS — B20 Human immunodeficiency virus [HIV] disease: Secondary | ICD-10-CM

## 2011-08-28 MED ORDER — EFAVIRENZ-EMTRICITAB-TENOFOVIR 600-200-300 MG PO TABS: 1.0000 | ORAL_TABLET | Freq: Every day | ORAL | Status: DC

## 2011-08-28 MED ORDER — BENAZEPRIL-HYDROCHLOROTHIAZIDE 10-12.5 MG PO TABS: 1.0000 | ORAL_TABLET | Freq: Every day | ORAL | Status: DC

## 2011-08-28 NOTE — Telephone Encounter (Signed)
Message left.  Requested that pt call [facility] to make f/u appts for lab work and MD.

## 2011-10-10 ENCOUNTER — Other Ambulatory Visit: Payer: Self-pay | Admitting: Infectious Diseases

## 2011-10-10 DIAGNOSIS — Z79899 Other long term (current) drug therapy: Secondary | ICD-10-CM

## 2011-10-10 DIAGNOSIS — B20 Human immunodeficiency virus [HIV] disease: Secondary | ICD-10-CM

## 2011-10-10 DIAGNOSIS — Z113 Encounter for screening for infections with a predominantly sexual mode of transmission: Secondary | ICD-10-CM

## 2011-10-17 ENCOUNTER — Other Ambulatory Visit: Payer: 59

## 2011-10-17 DIAGNOSIS — B20 Human immunodeficiency virus [HIV] disease: Secondary | ICD-10-CM

## 2011-10-17 DIAGNOSIS — Z113 Encounter for screening for infections with a predominantly sexual mode of transmission: Secondary | ICD-10-CM

## 2011-10-17 DIAGNOSIS — Z79899 Other long term (current) drug therapy: Secondary | ICD-10-CM

## 2011-10-18 LAB — CBC WITH DIFFERENTIAL/PLATELET
Basophils Absolute: 0 10*3/uL (ref 0.0–0.1)
Basophils Relative: 0 % (ref 0–1)
Eosinophils Absolute: 0.1 10*3/uL (ref 0.0–0.7)
Hemoglobin: 13.6 g/dL (ref 12.0–15.0)
MCH: 31.6 pg (ref 26.0–34.0)
MCHC: 32.8 g/dL (ref 30.0–36.0)
Neutro Abs: 2.5 10*3/uL (ref 1.7–7.7)
Neutrophils Relative %: 55 % (ref 43–77)
Platelets: 241 10*3/uL (ref 150–400)

## 2011-10-18 LAB — LIPID PANEL
Cholesterol: 198 mg/dL (ref 0–200)
Total CHOL/HDL Ratio: 3.2 Ratio
Triglycerides: 125 mg/dL (ref <150)
VLDL: 25 mg/dL (ref 0–40)

## 2011-10-18 LAB — COMPREHENSIVE METABOLIC PANEL
ALT: 15 U/L (ref 0–35)
AST: 25 U/L (ref 0–37)
Albumin: 4.6 g/dL (ref 3.5–5.2)
Alkaline Phosphatase: 46 U/L (ref 39–117)
Glucose, Bld: 91 mg/dL (ref 70–99)
Potassium: 3.9 mEq/L (ref 3.5–5.3)
Sodium: 138 mEq/L (ref 135–145)
Total Bilirubin: 0.4 mg/dL (ref 0.3–1.2)
Total Protein: 7.4 g/dL (ref 6.0–8.3)

## 2011-10-18 LAB — T-HELPER CELL (CD4) - (RCID CLINIC ONLY): CD4 T Cell Abs: 720 uL (ref 400–2700)

## 2011-11-08 ENCOUNTER — Ambulatory Visit (INDEPENDENT_AMBULATORY_CARE_PROVIDER_SITE_OTHER): Payer: 59 | Admitting: Infectious Diseases

## 2011-11-08 ENCOUNTER — Encounter: Payer: Self-pay | Admitting: Infectious Diseases

## 2011-11-08 VITALS — BP 135/86 | HR 91 | Temp 98.1°F | Ht 65.5 in | Wt 115.0 lb

## 2011-11-08 DIAGNOSIS — B20 Human immunodeficiency virus [HIV] disease: Secondary | ICD-10-CM

## 2011-11-08 DIAGNOSIS — Z113 Encounter for screening for infections with a predominantly sexual mode of transmission: Secondary | ICD-10-CM

## 2011-11-08 NOTE — Progress Notes (Signed)
Subjective:    Patient ID: Cynthia [name] is a 46 y.o. female.  Chief Complaint:f/u HIV care Jumana has been well and has no complaints. She is adherent to her medications. Data Review: HIV is <40mCD4 >700. Review of Systems  Constitutional: Negative.   HENT: Negative.   Respiratory: Negative.   Cardiovascular: Negative.   Gastrointestinal: Negative.   Genitourinary: Negative.   Musculoskeletal: Negative.   Skin: Negative.     Objective:  Physical Exam  Laboratory: see above  Assessment:  Stable HIV  Plan:   Continue Atripla and f/u 5-6 months

## 2012-01-15 ENCOUNTER — Encounter: Payer: Self-pay | Admitting: Women's Health

## 2012-01-15 DIAGNOSIS — B259 Cytomegaloviral disease, unspecified: Secondary | ICD-10-CM | POA: Insufficient documentation

## 2012-01-15 DIAGNOSIS — H3090 Unspecified chorioretinal inflammation, unspecified eye: Secondary | ICD-10-CM | POA: Insufficient documentation

## 2012-01-15 DIAGNOSIS — I10 Essential (primary) hypertension: Secondary | ICD-10-CM | POA: Insufficient documentation

## 2012-01-17 ENCOUNTER — Encounter: Payer: Self-pay | Admitting: Women's Health

## 2012-01-21 ENCOUNTER — Other Ambulatory Visit: Payer: Self-pay | Admitting: Infectious Diseases

## 2012-01-23 ENCOUNTER — Ambulatory Visit (INDEPENDENT_AMBULATORY_CARE_PROVIDER_SITE_OTHER): Payer: 59 | Admitting: Women's Health

## 2012-01-23 ENCOUNTER — Other Ambulatory Visit (HOSPITAL_COMMUNITY)
Admission: RE | Admit: 2012-01-23 | Discharge: 2012-01-23 | Disposition: A | Discharge status: Home / Self Care | Payer: 59 | Source: Ambulatory Visit | Attending: Women's Health | Admitting: Women's Health

## 2012-01-23 ENCOUNTER — Encounter: Payer: Self-pay | Admitting: Women's Health

## 2012-01-23 VITALS — BP 120/78 | Ht 65.5 in | Wt 114.0 lb

## 2012-01-23 DIAGNOSIS — Z1159 Encounter for screening for other viral diseases: Secondary | ICD-10-CM | POA: Insufficient documentation

## 2012-01-23 DIAGNOSIS — Z01419 Encounter for gynecological examination (general) (routine) without abnormal findings: Secondary | ICD-10-CM | POA: Insufficient documentation

## 2012-01-23 NOTE — Progress Notes (Signed)
Cynthia [name] 1966-07-29 [id number]    History:    The patient presents for annual exam.  Monthly 7 day cycle/condoms. HIV positive diagnosed in 1990-Dr. [employer]. Chronic hypertension stable on meds. Has had the Tdap in 2011 and the Pneumovax vaccine in 2010. Has had an increase in headaches this past year, states has always had headaches. History of normal Paps and mammograms.   Past medical history, past surgical history, family history and social history were all reviewed and documented in the EPIC chart. Works at [employer], has 2 horses. Husband HIV negative.   ROS:  A  ROS was performed and pertinent positives and negatives are included in the history.  Exam:  Filed Vitals:   01/23/12 1027  BP: 120/78    General appearance:  Normal Head/Neck:  Normal, without cervical or supraclavicular adenopathy. Thyroid:  Symmetrical, normal in size, without palpable masses or nodularity. Respiratory  Effort:  Normal  Auscultation:  Clear without wheezing or rhonchi Cardiovascular  Auscultation:  Regular rate, without rubs, murmurs or gallops  Edema/varicosities:  Not grossly evident Abdominal  Soft,nontender, without masses, guarding or rebound.  Liver/spleen:  No organomegaly noted  Hernia:  None appreciated  Skin  Inspection:  Grossly normal  Palpation:  Grossly normal Neurologic/psychiatric  Orientation:  Normal with appropriate conversation.  Mood/affect:  Normal  Genitourinary    Breasts: Examined lying and sitting.     Right: Without masses, retractions, discharge or axillary adenopathy.     Left: Without masses, retractions, discharge or axillary adenopathy.   Inguinal/mons:  Normal without inguinal adenopathy  External genitalia:  Normal  BUS/Urethra/Skene's glands:  Normal  Bladder:  Normal  Vagina:  Normal  Cervix:  Normal  Uterus:   normal in size, shape and contour.  Midline and mobile  Adnexa/parametria:     Rt: Without masses or  tenderness.   Lt: Without masses or tenderness.  Anus and perineum: Normal  Digital rectal exam: Normal sphincter tone without palpated masses or tenderness  Assessment/Plan:  46 y.o. M. WF G4 P1 for annual exam.    HIV positive-doing well - Dr. Maurice [name] labs and meds Hypertension-stable on meds  Plan: SBE's, exercise, calcium rich diet, MVI daily, vitamin D 1000 daily and continue exercise encouraged. Continue healthy lifestyle. Pap with high-risk HPV screening. History of normal Paps, will discuss change of headaches with primary care.     Cynthia [name] [facility], 12:06 PM 01/23/2012

## 2012-01-23 NOTE — Patient Instructions (Addendum)

## 2012-03-11 ENCOUNTER — Other Ambulatory Visit: Payer: Self-pay | Admitting: Women's Health

## 2012-03-11 DIAGNOSIS — Z1231 Encounter for screening mammogram for malignant neoplasm of breast: Secondary | ICD-10-CM

## 2012-03-27 ENCOUNTER — Ambulatory Visit (HOSPITAL_COMMUNITY)
Admission: RE | Admit: 2012-03-27 | Discharge: 2012-03-27 | Disposition: A | Discharge status: Home / Self Care | Payer: 59 | Source: Ambulatory Visit | Attending: Women's Health | Admitting: Women's Health

## 2012-03-27 DIAGNOSIS — Z1231 Encounter for screening mammogram for malignant neoplasm of breast: Secondary | ICD-10-CM

## 2012-07-23 ENCOUNTER — Ambulatory Visit (INDEPENDENT_AMBULATORY_CARE_PROVIDER_SITE_OTHER): Payer: 59 | Admitting: Gynecology

## 2012-07-23 ENCOUNTER — Encounter: Payer: Self-pay | Admitting: Gynecology

## 2012-07-23 VITALS — BP 114/70

## 2012-07-23 DIAGNOSIS — N390 Urinary tract infection, site not specified: Secondary | ICD-10-CM

## 2012-07-23 DIAGNOSIS — R300 Dysuria: Secondary | ICD-10-CM

## 2012-07-23 LAB — URINALYSIS W MICROSCOPIC + REFLEX CULTURE
Ketones, ur: NEGATIVE mg/dL
Nitrite: NEGATIVE
Urobilinogen, UA: 0.2 mg/dL (ref 0.0–1.0)

## 2012-07-23 MED ORDER — NITROFURANTOIN MONOHYD MACRO 100 MG PO CAPS: 100.0000 mg | ORAL_CAPSULE | Freq: Two times a day (BID) | ORAL | Status: DC

## 2012-07-23 NOTE — Progress Notes (Signed)
Patient presented to the office today stating that as of last night she began experiencing dysuria and frequency and not completing into her bladder. She denied any fever chills nausea or vomiting. She denied any back pain. Patient 9 vaginal discharge  On exam patient had no CVA tenderness and abdomen was soft nontender no rebound or guarding  Urinalysis: 21-50 WBC, 21-50 rbc, few bacteria  Assessment/plan: Urinary tract infection will be treated with Macrobid one by mouth twice a day for 7 days. She was given samples of Uribell antispasmodic agent to take 1 by mouth 4 times a day for 2 days. She was encouraged to increase her fluid intake. She was instructed to report to the office or after hours to the emergency room if she develops any flank pain or fever.

## 2012-07-23 NOTE — Patient Instructions (Addendum)
Urinary Tract Infection Urinary tract infections (UTIs) can develop anywhere along your urinary tract. Your urinary tract is your body's drainage system for removing wastes and extra water. Your urinary tract includes two kidneys, two ureters, a bladder, and a urethra. Your kidneys are a pair of bean-shaped organs. Each kidney is about the size of your fist. They are located below your ribs, one on each side of your spine. CAUSES Infections are caused by microbes, which are microscopic organisms, including fungi, viruses, and bacteria. These organisms are so small that they can only be seen through a microscope. Bacteria are the microbes that most commonly cause UTIs. SYMPTOMS  Symptoms of UTIs may vary by age and gender of the patient and by the location of the infection. Symptoms in young women typically include a frequent and intense urge to urinate and a painful, burning feeling in the bladder or urethra during urination. Older women and men are more likely to be tired, shaky, and weak and have muscle aches and abdominal pain. A fever may mean the infection is in your kidneys. Other symptoms of a kidney infection include pain in your back or sides below the ribs, nausea, and vomiting. DIAGNOSIS To diagnose a UTI, your caregiver will ask you about your symptoms. Your caregiver also will ask to provide a urine sample. The urine sample will be tested for bacteria and white blood cells. White blood cells are made by your body to help fight infection. TREATMENT  Typically, UTIs can be treated with medication. Because most UTIs are caused by a bacterial infection, they usually can be treated with the use of antibiotics. The choice of antibiotic and length of treatment depend on your symptoms and the type of bacteria causing your infection. HOME CARE INSTRUCTIONS  If you were prescribed antibiotics, take them exactly as your caregiver instructs you. Finish the medication even if you feel better after you  have only taken some of the medication.  Drink enough water and fluids to keep your urine clear or pale yellow.  Avoid caffeine, tea, and carbonated beverages. They tend to irritate your bladder.  Empty your bladder often. Avoid holding urine for long periods of time.  Empty your bladder before and after sexual intercourse.  After a bowel movement, women should cleanse from front to back. Use each tissue only once. SEEK MEDICAL CARE IF:   You have back pain.  You develop a fever.  Your symptoms do not begin to resolve within 3 days. SEEK IMMEDIATE MEDICAL CARE IF:   You have severe back pain or lower abdominal pain.  You develop chills.  You have nausea or vomiting.  You have continued burning or discomfort with urination. MAKE SURE YOU:   Understand these instructions.  Will watch your condition.  Will get help right away if you are not doing well or get worse. Document Released: 05/02/2005 Document Revised: 01/22/2012 Document Reviewed: 08/31/2011 ExitCare Patient Information 2013 [employer].  

## 2012-07-26 LAB — URINE CULTURE: Colony Count: 25000

## 2012-08-16 ENCOUNTER — Other Ambulatory Visit: Payer: Self-pay | Admitting: Infectious Diseases

## 2012-08-20 ENCOUNTER — Other Ambulatory Visit: Payer: 59

## 2012-09-05 ENCOUNTER — Ambulatory Visit: Payer: 59 | Admitting: Infectious Diseases

## 2012-09-20 ENCOUNTER — Other Ambulatory Visit: Payer: Self-pay

## 2012-10-29 ENCOUNTER — Other Ambulatory Visit: Payer: 59

## 2012-10-29 ENCOUNTER — Other Ambulatory Visit: Payer: Self-pay | Admitting: Infectious Diseases

## 2012-10-29 DIAGNOSIS — Z113 Encounter for screening for infections with a predominantly sexual mode of transmission: Secondary | ICD-10-CM

## 2012-10-29 DIAGNOSIS — B20 Human immunodeficiency virus [HIV] disease: Secondary | ICD-10-CM

## 2012-10-29 LAB — COMPREHENSIVE METABOLIC PANEL
ALT: 12 U/L (ref 0–35)
AST: 15 U/L (ref 0–37)
Calcium: 9 mg/dL (ref 8.4–10.5)
Chloride: 103 mEq/L (ref 96–112)
Creat: 0.87 mg/dL (ref 0.50–1.10)
Potassium: 4.3 mEq/L (ref 3.5–5.3)
Sodium: 135 mEq/L (ref 135–145)

## 2012-10-29 LAB — CBC WITH DIFFERENTIAL/PLATELET
Basophils Absolute: 0 10*3/uL (ref 0.0–0.1)
Basophils Relative: 1 % (ref 0–1)
Eosinophils Relative: 3 % (ref 0–5)
HCT: 34.6 % — ABNORMAL LOW (ref 36.0–46.0)
MCHC: 33.8 g/dL (ref 30.0–36.0)
Monocytes Absolute: 0.3 10*3/uL (ref 0.1–1.0)
Neutro Abs: 1.5 10*3/uL — ABNORMAL LOW (ref 1.7–7.7)
RDW: 13.4 % (ref 11.5–15.5)

## 2012-10-29 LAB — RPR

## 2012-10-30 ENCOUNTER — Other Ambulatory Visit: Payer: 59

## 2012-10-30 LAB — HIV-1 RNA QUANT-NO REFLEX-BLD: HIV-1 RNA Quant, Log: <1.3 {Log} (ref <1.30)

## 2012-11-14 ENCOUNTER — Encounter: Payer: Self-pay | Admitting: Infectious Diseases

## 2012-11-14 ENCOUNTER — Ambulatory Visit (INDEPENDENT_AMBULATORY_CARE_PROVIDER_SITE_OTHER): Payer: 59 | Admitting: Infectious Diseases

## 2012-11-14 VITALS — BP 117/75 | HR 82 | Temp 98.1°F | Ht 65.5 in | Wt 115.0 lb

## 2012-11-14 DIAGNOSIS — I10 Essential (primary) hypertension: Secondary | ICD-10-CM

## 2012-11-14 DIAGNOSIS — B20 Human immunodeficiency virus [HIV] disease: Secondary | ICD-10-CM

## 2012-11-14 MED ORDER — BENAZEPRIL-HYDROCHLOROTHIAZIDE 10-12.5 MG PO TABS: ORAL_TABLET | ORAL | Status: DC

## 2012-11-14 MED ORDER — EFAVIRENZ-EMTRICITAB-TENOFOVIR 600-200-300 MG PO TABS: ORAL_TABLET | ORAL | Status: DC

## 2012-12-05 ENCOUNTER — Encounter: Payer: Self-pay | Admitting: *Deleted

## 2013-02-18 ENCOUNTER — Other Ambulatory Visit (HOSPITAL_COMMUNITY)
Admission: RE | Admit: 2013-02-18 | Discharge: 2013-02-18 | Disposition: A | Discharge status: Home / Self Care | Payer: 59 | Source: Ambulatory Visit | Attending: Gynecology | Admitting: Gynecology

## 2013-02-18 ENCOUNTER — Encounter: Payer: Self-pay | Admitting: Women's Health

## 2013-02-18 ENCOUNTER — Ambulatory Visit (INDEPENDENT_AMBULATORY_CARE_PROVIDER_SITE_OTHER): Payer: 59 | Admitting: Women's Health

## 2013-02-18 VITALS — BP 106/66 | Ht 65.5 in | Wt 112.0 lb

## 2013-02-18 DIAGNOSIS — Z01419 Encounter for gynecological examination (general) (routine) without abnormal findings: Secondary | ICD-10-CM | POA: Insufficient documentation

## 2013-02-18 NOTE — Progress Notes (Signed)
Cynthia [name] [birth year] [id number]    History:    The patient presents for annual exam.  Monthly cycle/condoms. Normal Pap and mammogram history. HIV positive since 1990,  Dr. Maurice [name] manages. Husband negative . Pneumovax 2010. T. Dap 2011. Hypertension-primary care.  Past medical history, past surgical history, family history and social history were all reviewed and documented in the EPIC chart. Father hypertension and diabetes.    ROS:  A  ROS was performed and pertinent positives and negatives are included in the history.  Exam:  Filed Vitals:   02/18/13 1403  BP: 106/66    General appearance:  Normal Head/Neck:  Normal, without cervical or supraclavicular adenopathy. Thyroid:  Symmetrical, normal in size, without palpable masses or nodularity. Respiratory  Effort:  Normal  Auscultation:  Clear without wheezing or rhonchi Cardiovascular  Auscultation:  Regular rate, without rubs, murmurs or gallops  Edema/varicosities:  Not grossly evident Abdominal  Soft,nontender, without masses, guarding or rebound.  Liver/spleen:  No organomegaly noted  Hernia:  None appreciated  Skin  Inspection:  Grossly normal  Palpation:  Grossly normal Neurologic/psychiatric  Orientation:  Normal with appropriate conversation.  Mood/affect:  Normal  Genitourinary    Breasts: Examined lying and sitting.     Right: Without masses, retractions, discharge or axillary adenopathy.     Left: Without masses, retractions, discharge or axillary adenopathy.   Inguinal/mons:  Normal without inguinal adenopathy  External genitalia:  Normal  BUS/Urethra/Skene's glands:  Normal  Bladder:  Normal  Vagina:  Normal  Cervix:  Normal  Uterus:   normal in size, shape and contour.  Midline and mobile  Adnexa/parametria:     Rt: Without masses or tenderness.   Lt: Without masses or tenderness.  Anus and perineum: Normal  Digital rectal exam: Normal sphincter tone without palpated masses or  tenderness  Assessment/Plan:  47 y.o. M. WF G1 P1 for annual exam with no complaints.  Normal GYN exam/condoms HIV positive-Dr. Maurice [name] manages labs and meds. Hypertension managed primary care  Plan: SBE's, continue annual mammogram, calcium rich diet, vitamin D 2000 daily encouraged. Continue condoms for contraception. Pap  Pap normal with negative HR HPV 2013.      [name], 6:23 PM 02/18/2013

## 2013-02-18 NOTE — Patient Instructions (Addendum)

## 2013-04-13 ENCOUNTER — Other Ambulatory Visit: Payer: Self-pay | Admitting: Gynecology

## 2013-04-13 DIAGNOSIS — Z1231 Encounter for screening mammogram for malignant neoplasm of breast: Secondary | ICD-10-CM

## 2013-04-16 ENCOUNTER — Ambulatory Visit (HOSPITAL_COMMUNITY)
Admission: RE | Admit: 2013-04-16 | Discharge: 2013-04-16 | Disposition: A | Discharge status: Home / Self Care | Payer: 59 | Source: Ambulatory Visit | Attending: Gynecology | Admitting: Gynecology

## 2013-04-16 ENCOUNTER — Other Ambulatory Visit: Payer: Self-pay | Admitting: Gynecology

## 2013-04-16 DIAGNOSIS — Z1231 Encounter for screening mammogram for malignant neoplasm of breast: Secondary | ICD-10-CM

## 2013-05-13 ENCOUNTER — Other Ambulatory Visit: Payer: 59

## 2013-05-13 DIAGNOSIS — Z79899 Other long term (current) drug therapy: Secondary | ICD-10-CM

## 2013-05-13 DIAGNOSIS — B20 Human immunodeficiency virus [HIV] disease: Secondary | ICD-10-CM

## 2013-05-13 LAB — CBC WITH DIFFERENTIAL/PLATELET
Basophils Absolute: 0 10*3/uL (ref 0.0–0.1)
Basophils Relative: 1 % (ref 0–1)
Eosinophils Relative: 5 % (ref 0–5)
HCT: 35.7 % — ABNORMAL LOW (ref 36.0–46.0)
MCH: 30.9 pg (ref 26.0–34.0)
MCHC: 34.7 g/dL (ref 30.0–36.0)
MCV: 89 fL (ref 78.0–100.0)
Monocytes Absolute: 0.3 10*3/uL (ref 0.1–1.0)
Monocytes Relative: 10 % (ref 3–12)
RDW: 13.7 % (ref 11.5–15.5)

## 2013-05-13 LAB — COMPREHENSIVE METABOLIC PANEL
AST: 20 U/L (ref 0–37)
Alkaline Phosphatase: 42 U/L (ref 39–117)
BUN: 23 mg/dL (ref 6–23)
Calcium: 9.1 mg/dL (ref 8.4–10.5)
Creat: 1.04 mg/dL (ref 0.50–1.10)
Glucose, Bld: 99 mg/dL (ref 70–99)

## 2013-05-13 LAB — LIPID PANEL
Cholesterol: 181 mg/dL (ref 0–200)
HDL: 61 mg/dL (ref >39)
Triglycerides: 56 mg/dL (ref <150)

## 2013-05-14 LAB — T-HELPER CELL (CD4) - (RCID CLINIC ONLY)
CD4 % Helper T Cell: 47 % (ref 33–55)
CD4 T Cell Abs: 510 /uL (ref 400–2700)

## 2013-05-14 LAB — HIV-1 RNA QUANT-NO REFLEX-BLD: HIV-1 RNA Quant, Log: <1.3 {Log} (ref <1.30)

## 2013-05-27 ENCOUNTER — Ambulatory Visit: Payer: 59 | Admitting: Internal Medicine

## 2013-06-11 ENCOUNTER — Other Ambulatory Visit: Payer: Self-pay

## 2013-07-08 ENCOUNTER — Ambulatory Visit (INDEPENDENT_AMBULATORY_CARE_PROVIDER_SITE_OTHER): Payer: 59 | Admitting: Internal Medicine

## 2013-07-08 ENCOUNTER — Encounter: Payer: Self-pay | Admitting: Internal Medicine

## 2013-07-08 VITALS — BP 119/79 | HR 84 | Temp 98.2°F | Wt 115.0 lb

## 2013-07-08 DIAGNOSIS — IMO0002 Reserved for concepts with insufficient information to code with codable children: Secondary | ICD-10-CM

## 2013-07-08 DIAGNOSIS — S46911A Strain of unspecified muscle, fascia and tendon at shoulder and upper arm level, right arm, initial encounter: Secondary | ICD-10-CM

## 2013-07-08 DIAGNOSIS — B20 Human immunodeficiency virus [HIV] disease: Secondary | ICD-10-CM

## 2013-07-08 MED ORDER — ELVITEG-COBIC-EMTRICIT-TENOFDF 150-150-200-300 MG PO TABS: 1.0000 | ORAL_TABLET | Freq: Every day | ORAL | Status: DC

## 2013-07-08 NOTE — Progress Notes (Signed)
Subjective:    Patient ID: Cynthia [name], female    DOB: 01/08/1966, 47 y.o.   MRN: [id number]  HPI Cynthia [name], 42JW F with HIV, CD 4 count 510/VL<20, on atripla. Last seen in April with Dr. Maurice March. Presents to clinic to establish new provider.she reports doing well. Not missing a dose. She is in good health since last visit. She does have old injury  Of right shoulder which she reports having increased MSK pain in the last few days, taking ibuprofen  Current Outpatient Prescriptions on File Prior to Visit  Medication Sig Dispense Refill  . benazepril-hydrochlorthiazide (LOTENSIN HCT) 10-12.5 MG per tablet TAKE 1 TABLET ONCE DAILY.  30 tablet  12  . Cholecalciferol (VITAMIN D) 2000 UNITS tablet Take 2,000 Units by mouth daily.        Marland Kitchen efavirenz-emtricitabine-tenofovir (ATRIPLA) 600-200-300 MG per tablet TAKE ONE TABLET AT BEDTIME.  30 tablet  12   No current facility-administered medications on file prior to visit.   Active Ambulatory Problems    Diagnosis Date Noted  . HIV DISEASE 11/07/2006  . DISEASE, HYPERTENSIVE HEART, BENIGN, W/HF 11/07/2006  . CMV retinitis   . Hypertension    Resolved Ambulatory Problems    Diagnosis Date Noted  . No Resolved Ambulatory Problems   Past Medical History  Diagnosis Date  . HIV (human immunodeficiency virus infection) 1990   History  Substance Use Topics  . Smoking status: Former Games developer  . Smokeless tobacco: Never Used  . Alcohol Use: Yes     Comment: socially  family history includes Breast cancer in her paternal grandmother; Cancer in her maternal grandmother; Diabetes in her father and paternal grandmother; Heart disease in her maternal grandmother; Hypertension in her father and mother.   Review of Systems  Constitutional: Negative for fever, chills, diaphoresis, activity change, appetite change, fatigue and unexpected weight change.  HENT: Negative for congestion, sore throat, rhinorrhea, sneezing, trouble swallowing and sinus  pressure.  Eyes: Negative for photophobia and visual disturbance.  Respiratory: Negative for cough, chest tightness, shortness of breath, wheezing and stridor.  Cardiovascular: Negative for chest pain, palpitations and leg swelling.  Gastrointestinal: Negative for nausea, vomiting, abdominal pain, diarrhea, constipation, blood in stool, abdominal distention and anal bleeding.  Genitourinary: Negative for dysuria, hematuria, flank pain and difficulty urinating.  Musculoskeletal: right shoulder strain Skin: Negative for color change, pallor, rash and wound.  Neurological: Negative for dizziness, tremors, weakness and light-headedness.  Hematological: Negative for adenopathy. Does not bruise/bleed easily.  Psychiatric/Behavioral: Negative for behavioral problems, confusion, sleep disturbance, dysphoric mood, decreased concentration and agitation.       Objective:   Physical Exam BP 119/79  Pulse 84  Temp(Src) 98.2 F (36.8 C) (Oral)  Wt 115 lb (52.164 kg)  LMP 06/18/2013 Physical Exam  Constitutional: oriented to person, place, and time.  appears well-developed and well-nourished. No distress.  HENT:  Mouth/Throat: Oropharynx is clear and moist. No oropharyngeal exudate. Left pupil 4 mm, R pupil 2 mm Cardiovascular: Normal rate, regular rhythm and normal heart sounds. Exam reveals no gallop and no friction rub.  No murmur heard.  Pulmonary/Chest: Effort normal and breath sounds normal. No respiratory distress. has no wheezes.  Abdominal: Soft. Bowel sounds are normal.  exhibits no distension. There is no tenderness.  Lymphadenopathy:  no cervical adenopathy.  Neurological:  alert and oriented to person, place, and time. 5/5 strength in upper extremities. Full range of motion MSK: point tenderness to right sided trapezius Skin: Skin is warm and  dry. No rash noted. No erythema.  Psychiatric:  has a normal mood and affect.  behavior is normal.       Assessment & Plan:  HIV = change  to stribild. Will give copay card. Repeat vl in 4-6 wk  Health maintenance = received flu already., but will need to do hep b series again (double dosing) to get through employee health. uptodate on mammo and pap  msk strain= continue with supportive care. Will refer to ortho if not improved with time  rtc in 3 months

## 2013-07-20 ENCOUNTER — Encounter: Payer: Self-pay | Admitting: Internal Medicine

## 2013-08-13 ENCOUNTER — Other Ambulatory Visit: Payer: Self-pay | Admitting: Obstetrics & Gynecology

## 2013-08-13 MED ORDER — DOXYCYCLINE HYCLATE 100 MG PO CAPS: 100.0000 mg | ORAL_CAPSULE | Freq: Two times a day (BID) | ORAL | Status: DC

## 2013-08-13 NOTE — Progress Notes (Signed)
Pt has had 14 days of sinus congestion, purulent drainage.  Pt is not having fevers currently.  Pt does not have a history of recurrent sinusitis.  Pr has left siun tenderness.  Pt has PCN allergy. Up to date suggests doxycycline as next first line medication Rx to [facility]. If not improvement in 3 days will switch to Levoquin.

## 2013-09-02 ENCOUNTER — Other Ambulatory Visit: Payer: 59

## 2013-09-10 DIAGNOSIS — Z961 Presence of intraocular lens: Secondary | ICD-10-CM | POA: Insufficient documentation

## 2013-12-08 ENCOUNTER — Other Ambulatory Visit: Payer: Self-pay | Admitting: Infectious Diseases

## 2014-01-08 ENCOUNTER — Other Ambulatory Visit: Payer: Self-pay | Admitting: *Deleted

## 2014-01-08 ENCOUNTER — Other Ambulatory Visit: Payer: Self-pay | Admitting: Infectious Diseases

## 2014-01-08 DIAGNOSIS — I10 Essential (primary) hypertension: Secondary | ICD-10-CM

## 2014-01-08 DIAGNOSIS — B20 Human immunodeficiency virus [HIV] disease: Secondary | ICD-10-CM

## 2014-01-08 MED ORDER — BENAZEPRIL-HYDROCHLOROTHIAZIDE 10-12.5 MG PO TABS: ORAL_TABLET | ORAL | Status: DC

## 2014-01-08 MED ORDER — ELVITEG-COBIC-EMTRICIT-TENOFDF 150-150-200-300 MG PO TABS: 1.0000 | ORAL_TABLET | Freq: Every day | ORAL | Status: DC

## 2014-01-19 ENCOUNTER — Other Ambulatory Visit: Payer: Self-pay | Admitting: *Deleted

## 2014-01-19 DIAGNOSIS — Z113 Encounter for screening for infections with a predominantly sexual mode of transmission: Secondary | ICD-10-CM

## 2014-01-19 DIAGNOSIS — Z79899 Other long term (current) drug therapy: Secondary | ICD-10-CM

## 2014-01-19 DIAGNOSIS — B20 Human immunodeficiency virus [HIV] disease: Secondary | ICD-10-CM

## 2014-02-03 ENCOUNTER — Other Ambulatory Visit: Payer: Self-pay | Admitting: Licensed Clinical Social Worker

## 2014-02-03 ENCOUNTER — Other Ambulatory Visit: Payer: 59

## 2014-02-03 DIAGNOSIS — B20 Human immunodeficiency virus [HIV] disease: Secondary | ICD-10-CM

## 2014-02-03 DIAGNOSIS — Z79899 Other long term (current) drug therapy: Secondary | ICD-10-CM

## 2014-02-03 DIAGNOSIS — I10 Essential (primary) hypertension: Secondary | ICD-10-CM

## 2014-02-03 DIAGNOSIS — Z113 Encounter for screening for infections with a predominantly sexual mode of transmission: Secondary | ICD-10-CM

## 2014-02-03 LAB — COMPLETE METABOLIC PANEL WITH GFR
ALBUMIN: 4.2 g/dL (ref 3.5–5.2)
ALT: 11 U/L (ref 0–35)
AST: 16 U/L (ref 0–37)
Alkaline Phosphatase: 47 U/L (ref 39–117)
BUN: 19 mg/dL (ref 6–23)
CALCIUM: 9.1 mg/dL (ref 8.4–10.5)
CO2: 27 meq/L (ref 19–32)
Chloride: 103 mEq/L (ref 96–112)
Creat: 1.05 mg/dL (ref 0.50–1.10)
GFR, EST AFRICAN AMERICAN: 74 mL/min
GFR, EST NON AFRICAN AMERICAN: 64 mL/min
GLUCOSE: 148 mg/dL — AB (ref 70–99)
POTASSIUM: 3.3 meq/L — AB (ref 3.5–5.3)
SODIUM: 138 meq/L (ref 135–145)
TOTAL PROTEIN: 6.4 g/dL (ref 6.0–8.3)
Total Bilirubin: 0.4 mg/dL (ref 0.2–1.2)

## 2014-02-03 LAB — CBC WITH DIFFERENTIAL/PLATELET
Basophils Absolute: 0.1 10*3/uL (ref 0.0–0.1)
Basophils Relative: 1 % (ref 0–1)
Eosinophils Absolute: 0.2 10*3/uL (ref 0.0–0.7)
Eosinophils Relative: 4 % (ref 0–5)
HEMATOCRIT: 35.6 % — AB (ref 36.0–46.0)
HEMOGLOBIN: 12.4 g/dL (ref 12.0–15.0)
LYMPHS ABS: 1.6 10*3/uL (ref 0.7–4.0)
Lymphocytes Relative: 31 % (ref 12–46)
MCH: 31.3 pg (ref 26.0–34.0)
MCHC: 34.8 g/dL (ref 30.0–36.0)
MCV: 89.9 fL (ref 78.0–100.0)
MONOS PCT: 7 % (ref 3–12)
Monocytes Absolute: 0.4 10*3/uL (ref 0.1–1.0)
NEUTROS ABS: 2.9 10*3/uL (ref 1.7–7.7)
NEUTROS PCT: 57 % (ref 43–77)
Platelets: 236 10*3/uL (ref 150–400)
RBC: 3.96 MIL/uL (ref 3.87–5.11)
RDW: 13 % (ref 11.5–15.5)
WBC: 5.1 10*3/uL (ref 4.0–10.5)

## 2014-02-03 LAB — LIPID PANEL
Cholesterol: 206 mg/dL — ABNORMAL HIGH (ref 0–200)
HDL: 54 mg/dL (ref >39)
LDL CALC: 132 mg/dL — AB (ref 0–99)
Total CHOL/HDL Ratio: 3.8 Ratio
Triglycerides: 100 mg/dL (ref <150)
VLDL: 20 mg/dL (ref 0–40)

## 2014-02-03 MED ORDER — BENAZEPRIL-HYDROCHLOROTHIAZIDE 10-12.5 MG PO TABS: ORAL_TABLET | ORAL | Status: DC

## 2014-02-03 MED ORDER — EFAVIRENZ-EMTRICITAB-TENOFOVIR 600-200-300 MG PO TABS: 1.0000 | ORAL_TABLET | Freq: Every day | ORAL | Status: DC

## 2014-02-04 LAB — T-HELPER CELL (CD4) - (RCID CLINIC ONLY)
CD4 % Helper T Cell: 47 % (ref 33–55)
CD4 T Cell Abs: 780 /uL (ref 400–2700)

## 2014-02-04 LAB — RPR

## 2014-02-05 LAB — HIV-1 RNA QUANT-NO REFLEX-BLD

## 2014-02-09 ENCOUNTER — Other Ambulatory Visit: Payer: Self-pay | Admitting: *Deleted

## 2014-02-09 DIAGNOSIS — I10 Essential (primary) hypertension: Secondary | ICD-10-CM

## 2014-02-09 MED ORDER — BENAZEPRIL-HYDROCHLOROTHIAZIDE 10-12.5 MG PO TABS: ORAL_TABLET | ORAL | Status: DC

## 2014-02-12 ENCOUNTER — Other Ambulatory Visit: Payer: Self-pay | Admitting: Internal Medicine

## 2014-02-14 ENCOUNTER — Encounter: Payer: Self-pay | Admitting: Internal Medicine

## 2014-02-14 ENCOUNTER — Other Ambulatory Visit: Payer: Self-pay | Admitting: Internal Medicine

## 2014-02-15 ENCOUNTER — Other Ambulatory Visit: Payer: Self-pay | Admitting: *Deleted

## 2014-02-15 MED ORDER — BENAZEPRIL-HYDROCHLOROTHIAZIDE 10-12.5 MG PO TABS: 1.0000 | ORAL_TABLET | Freq: Every day | ORAL | Status: DC

## 2014-02-17 ENCOUNTER — Ambulatory Visit (INDEPENDENT_AMBULATORY_CARE_PROVIDER_SITE_OTHER): Payer: 59 | Admitting: Internal Medicine

## 2014-02-17 VITALS — BP 104/69 | HR 82 | Temp 98.3°F | Wt 115.0 lb

## 2014-02-17 DIAGNOSIS — R7309 Other abnormal glucose: Secondary | ICD-10-CM

## 2014-02-17 DIAGNOSIS — R739 Hyperglycemia, unspecified: Secondary | ICD-10-CM

## 2014-02-17 NOTE — Progress Notes (Signed)
Subjective:    Patient ID: Cynthia [name], female    DOB: June 28, 1966, 48 y.o.   MRN: [id number]  HPI Cynthia [name] is 48yo F with hiv disease, Cd 4 count of 780/VL<20 on atripla. She is doing well. Went to DR in June had gastroenteritis/traveler's diarrhea requiring hospitalization, finished course of cipro. She is doing better now. Has occ one loose stool per week.  Current Outpatient Prescriptions on File Prior to Visit  Medication Sig Dispense Refill  . benazepril-hydrochlorthiazide (LOTENSIN HCT) 10-12.5 MG per tablet Take 1 tablet by mouth daily.  90 tablet  3  . Cholecalciferol (VITAMIN D) 2000 UNITS tablet Take 2,000 Units by mouth daily.        Marland Kitchen efavirenz-emtricitabine-tenofovir (ATRIPLA) 600-200-300 MG per tablet Take 1 tablet by mouth at bedtime.  30 tablet  11  . doxycycline (VIBRAMYCIN) 100 MG capsule Take 1 capsule (100 mg total) by mouth 2 (two) times daily.  14 capsule  0   No current facility-administered medications on file prior to visit.   Active Ambulatory Problems    Diagnosis Date Noted  . HIV DISEASE 11/07/2006  . DISEASE, HYPERTENSIVE HEART, BENIGN, W/HF 11/07/2006  . CMV retinitis   . Hypertension    Resolved Ambulatory Problems    Diagnosis Date Noted  . No Resolved Ambulatory Problems   Past Medical History  Diagnosis Date  . HIV (human immunodeficiency virus infection) 1990       Review of Systems Review of Systems  Constitutional: Negative for fever, chills, diaphoresis, activity change, appetite change, fatigue and unexpected weight change.  HENT: Negative for congestion, sore throat, rhinorrhea, sneezing, trouble swallowing and sinus pressure.  Eyes: Negative for photophobia and visual disturbance.  Respiratory: Negative for cough, chest tightness, shortness of breath, wheezing and stridor.  Cardiovascular: Negative for chest pain, palpitations and leg swelling.  Gastrointestinal: Negative for nausea, vomiting, abdominal pain, diarrhea,  constipation, blood in stool, abdominal distention and anal bleeding.  Genitourinary: Negative for dysuria, hematuria, flank pain and difficulty urinating.  Musculoskeletal: Negative for myalgias, back pain, joint swelling, arthralgias and gait problem.  Skin: Negative for color change, pallor, rash and wound.  Neurological: Negative for dizziness, tremors, weakness and light-headedness.  Hematological: Negative for adenopathy. Does not bruise/bleed easily.  Psychiatric/Behavioral: Negative for behavioral problems, confusion, sleep disturbance, dysphoric mood, decreased concentration and agitation.       Objective:   Physical Exam BP 104/69  Pulse 82  Temp(Src) 98.3 F (36.8 C) (Oral)  Wt 115 lb (52.164 kg)  LMP 01/23/2014 Physical Exam  Constitutional:  oriented to person, place, and time. appears well-developed and well-nourished. No distress.  HENT:  Mouth/Throat: Oropharynx is clear and moist. No oropharyngeal exudate.  Cardiovascular: Normal rate, regular rhythm and normal heart sounds. Exam reveals no gallop and no friction rub.  No murmur heard.  Pulmonary/Chest: Effort normal and breath sounds normal. No respiratory distress.  has no wheezes.  Abdominal: Soft. Bowel sounds are normal.  exhibits no distension. There is no tenderness.  Lymphadenopathy: no cervical adenopathy.  Neurological: alert and oriented to person, place, and time.  Skin: Skin is warm and dry. No rash noted. No erythema.  Psychiatric: a normal mood and affect. behavior is normal.         Assessment & Plan:  hiv = well controlled. Continue on atripla  Hyperglycemia= will check hemoglobin a1c  Health maintenance = up todate on mammo, pap, and hep b via health maintenance  Diarrhea= resolved. Likely had viral gastroenteritis  vs. ecoli related traveler's diarrhea.  rtc in 6 months

## 2014-02-18 LAB — HEMOGLOBIN A1C
HEMOGLOBIN A1C: 5.3 % (ref <5.7)
MEAN PLASMA GLUCOSE: 105 mg/dL (ref <117)

## 2014-03-10 ENCOUNTER — Ambulatory Visit (INDEPENDENT_AMBULATORY_CARE_PROVIDER_SITE_OTHER): Payer: 59 | Admitting: Women's Health

## 2014-03-10 ENCOUNTER — Encounter: Payer: Self-pay | Admitting: Women's Health

## 2014-03-10 VITALS — BP 108/68 | Ht 65.5 in | Wt 116.8 lb

## 2014-03-10 DIAGNOSIS — Z01419 Encounter for gynecological examination (general) (routine) without abnormal findings: Secondary | ICD-10-CM

## 2014-03-10 NOTE — Progress Notes (Signed)
Cynthia [name] May 02, 1966 [id number]    History:    Presents for annual exam.  Regular monthly cycle/condoms. 1990 HIV diagnosed well-controlled HIV RNA not detected on labs. Dr. Orene Desanctis manages. Normal Pap and mammogram history.   Past medical history, past surgical history, family history and social history were all reviewed and documented in the EPIC chart. Works for current. Has one daughter doing well, planning to adopt a special needs child from [place].. Has 2 horses. Father diabetes and hypertension. Maternal grandmother colon cancer  ROS:  A  12 point ROS was performed and pertinent positives and negatives are included.  Exam:  Filed Vitals:   03/10/14 1604  BP: 108/68    General appearance:  Normal Thyroid:  Symmetrical, normal in size, without palpable masses or nodularity. Respiratory  Auscultation:  Clear without wheezing or rhonchi Cardiovascular  Auscultation:  Regular rate, without rubs, murmurs or gallops  Edema/varicosities:  Not grossly evident Abdominal  Soft,nontender, without masses, guarding or rebound.  Liver/spleen:  No organomegaly noted  Hernia:  None appreciated  Skin  Inspection:  Grossly normal   Breasts: Examined lying and sitting.     Right: Without masses, retractions, discharge or axillary adenopathy.     Left: Without masses, retractions, discharge or axillary adenopathy. Gentitourinary   Inguinal/mons:  Normal without inguinal adenopathy  External genitalia:  Normal  BUS/Urethra/Skene's glands:  Normal  Vagina:  Normal  Cervix:  Normal  Uterus:   normal in size, shape and contour.  Midline and mobile  Adnexa/parametria:     Rt: Without masses or tenderness.   Lt: Without masses or tenderness.  Anus and perineum: Normal  Digital rectal exam: Normal sphincter tone without palpated masses or tenderness  Assessment/Plan:  48 y.o. MWF G1P1 for annual exam.     Regular monthly cycle/condoms 1990 HIV -  Dr. Orene Desanctis manages Hypertension primary care  manages labs and meds  Plan: SBE's, continue annual mammogram 3-D tomography reviewed and encouraged history of dense breast. Regular exercise, calcium rich diet, vitamin D 2000 daily encouraged. Pap,   Note: This dictation was prepared with Dragon/digital dictation.  Any transcriptional errors that result are unintentional. Huel Cote Scnetx, 4:41 PM 03/10/2014

## 2014-03-10 NOTE — Patient Instructions (Signed)

## 2014-03-11 ENCOUNTER — Other Ambulatory Visit (HOSPITAL_COMMUNITY)
Admission: RE | Admit: 2014-03-11 | Discharge: 2014-03-11 | Disposition: A | Discharge status: Home / Self Care | Payer: 59 | Source: Ambulatory Visit | Attending: Gynecology | Admitting: Gynecology

## 2014-03-11 DIAGNOSIS — Z01419 Encounter for gynecological examination (general) (routine) without abnormal findings: Secondary | ICD-10-CM | POA: Insufficient documentation

## 2014-03-11 NOTE — Addendum Note (Signed)
Addended by: Alen Blew on: 03/11/2014 12:40 PM   Modules accepted: Orders

## 2014-03-15 LAB — CYTOLOGY - PAP

## 2014-04-24 ENCOUNTER — Encounter: Payer: Self-pay | Admitting: Emergency Medicine

## 2014-04-24 ENCOUNTER — Emergency Department
Admission: EM | Admit: 2014-04-24 | Discharge: 2014-04-24 | Disposition: A | Discharge status: Home / Self Care | Payer: 59 | Source: Home / Self Care | Attending: Emergency Medicine | Admitting: Emergency Medicine

## 2014-04-24 DIAGNOSIS — J0101 Acute recurrent maxillary sinusitis: Secondary | ICD-10-CM

## 2014-04-24 DIAGNOSIS — J0100 Acute maxillary sinusitis, unspecified: Secondary | ICD-10-CM

## 2014-04-24 MED ORDER — CEFDINIR 300 MG PO CAPS: 300.0000 mg | ORAL_CAPSULE | Freq: Two times a day (BID) | ORAL | Status: DC

## 2014-04-24 NOTE — ED Provider Notes (Signed)
CSN: [id number]     Arrival date & time 04/24/14  1245 History   First MD Initiated Contact with Patient 04/24/14 1312     Chief Complaint  Patient presents with  . URI    HPI URI sx and has been using Zyrtec, Nasocort and Saline sol. Mucus changed to yellow and green and thick.L Headache intermittently, not currently. L ear feels stopped up.  Cynthia [name] is a 48 y.o. female who complains of onset of cold/sinus symptoms for 7 days.  Have been using over-the-counter treatment , such as Zyrtec, Nasacort, and saline irrigation, and that's helped a little bit. However, symptoms progressively worsening with yellow-green left nasal mucus and left earache. In the past has had recurrent sinus infections. We reviewed drug allergies including sulfa and amoxicillin, but she has taken Garza-Salinas II in the past without problems. Has seen Dr. Lucia Gaskins in the past, her ENT, for left nasal polyp, no surgery has been needed for this, up to this point, she states.  No chills/sweats + Minimal, low-grade Fever, not documented  +  Nasal congestion +  Discolored Post-nasal drainage Mild left-sided sinus pain/pressure No sore throat  No cough No wheezing No chest congestion No hemoptysis No shortness of breath No pleuritic pain  No itchy/red eyes + L earache  No nausea No vomiting No abdominal pain No diarrhea  No skin rashes + Minimal Fatigue No myalgias No headache   Past Medical History  Diagnosis Date  . CMV retinitis   . Hypertension   . HIV (human immunodeficiency virus infection) 1990    Dr. Orene Desanctis   Past Surgical History  Procedure Laterality Date  . Eye surgery      Six surgeries  . Mandible surgery    . Nose surgery    . Laser vaporization of of condyloma    . Finger surgery  05/2010    "SCREWS IN AND OUT OF FINGER"   Family History  Problem Relation Age of Onset  . Diabetes Father   . Hypertension Father   . Cancer Maternal Grandmother     COLON  . Heart disease Maternal  Grandmother   . Breast cancer Paternal Grandmother   . Diabetes Paternal Grandmother   . Hypertension Mother    History  Substance Use Topics  . Smoking status: Former Research scientist (life sciences)  . Smokeless tobacco: Never Used  . Alcohol Use: Yes     Comment: socially   OB History   Grav Para Term Preterm Abortions TAB SAB Ect Mult Living   4 1   3  3   1      Review of Systems  All other systems reviewed and are negative.   Allergies  Amoxicillin; Dapsone; and Penicillins  Home Medications   Prior to Admission medications   Medication Sig Start Date End Date Taking? Authorizing Provider  benazepril-hydrochlorthiazide (LOTENSIN HCT) 10-12.5 MG per tablet Take 1 tablet by mouth daily. 02/15/14   Carlyle Basques, MD  cefdinir (OMNICEF) 300 MG capsule Take 1 capsule (300 mg total) by mouth 2 (two) times daily. X 10 days 04/24/14   Jacqulyn Cane, MD  Cholecalciferol (VITAMIN D) 2000 UNITS tablet Take 2,000 Units by mouth daily.      Historical Provider, MD  efavirenz-emtricitabine-tenofovir (ATRIPLA) 076-226-333 MG per tablet Take 1 tablet by mouth at bedtime. 02/03/14   Carlyle Basques, MD   BP 105/71  Pulse 71  Temp(Src) 97.6 F (36.4 C) (Oral)  Ht 5' 5.5" (1.664 m)  Wt 114 lb 12 oz (52.05 kg)  BMI 18.80 kg/m2  SpO2 100%  LMP 04/19/2014 Physical Exam  Nursing note and vitals reviewed. Constitutional: She is oriented to person, place, and time. She appears well-developed and well-nourished. No distress.  HENT:  Head: Normocephalic and atraumatic.  Right Ear: Tympanic membrane, external ear and ear canal normal.  Left Ear: External ear and ear canal normal. Tympanic membrane is injected. Tympanic membrane is not perforated and not bulging. A middle ear effusion is present.  Nose: Mucosal edema and rhinorrhea present. Right sinus exhibits no maxillary sinus tenderness and no frontal sinus tenderness. Left sinus exhibits maxillary sinus tenderness (Minimal). Left sinus exhibits no frontal sinus  tenderness.  Mouth/Throat: Oropharynx is clear and moist. No oral lesions. No oropharyngeal exudate.  Turbinates boggy, with left nasal polyp  Eyes: Right eye exhibits no discharge. Left eye exhibits no discharge. No scleral icterus.  Neck: Neck supple.  Cardiovascular: Normal rate, regular rhythm and normal heart sounds.   Pulmonary/Chest: Effort normal and breath sounds normal. She has no wheezes. She has no rales.  Lymphadenopathy:    She has no cervical adenopathy.  Neurological: She is alert and oriented to person, place, and time.  Skin: Skin is warm and dry. No rash noted.    ED Course  Procedures (including critical care time) Labs Review Labs Reviewed - No data to display  Imaging Review No results found.   MDM   1. Acute recurrent maxillary sinusitis    Treatment options discussed, as well as risks, benefits, alternatives. Patient voiced understanding and agreement with the following plans: Omnicef 300 twice a day x10 days Other OTC symptomatic care discussed. If Zyrtec overdries, hold off Zyrtec for a few days  Follow-up with your ENT, Dr. Lucia Gaskins 5-7 days if not improving, or sooner if symptoms become worse. Precautions discussed. Red flags discussed. Questions invited and answered. Patient voiced understanding and agreement.     Jacqulyn Cane, MD 04/24/14 1329

## 2014-04-24 NOTE — ED Notes (Signed)
URI sx and has been using Zyrtec, Nasocort and Saline sol.   Mucus changed to yellow and green and thick.  Headache intermittently.  L ear feels stopped up.

## 2014-05-17 ENCOUNTER — Other Ambulatory Visit: Payer: Self-pay | Admitting: Women's Health

## 2014-05-17 DIAGNOSIS — Z1231 Encounter for screening mammogram for malignant neoplasm of breast: Secondary | ICD-10-CM

## 2014-05-18 ENCOUNTER — Other Ambulatory Visit: Payer: Self-pay | Admitting: Internal Medicine

## 2014-05-18 ENCOUNTER — Other Ambulatory Visit: Payer: Self-pay | Admitting: *Deleted

## 2014-05-18 DIAGNOSIS — B20 Human immunodeficiency virus [HIV] disease: Secondary | ICD-10-CM

## 2014-05-18 MED ORDER — EFAVIRENZ-EMTRICITAB-TENOFOVIR 600-200-300 MG PO TABS: 1.0000 | ORAL_TABLET | Freq: Every day | ORAL | Status: DC

## 2014-06-02 ENCOUNTER — Ambulatory Visit (HOSPITAL_COMMUNITY)
Admission: RE | Admit: 2014-06-02 | Discharge: 2014-06-02 | Disposition: A | Discharge status: Home / Self Care | Payer: 59 | Source: Ambulatory Visit | Attending: Women's Health | Admitting: Women's Health

## 2014-06-02 ENCOUNTER — Encounter: Payer: Self-pay | Admitting: Women's Health

## 2014-06-02 DIAGNOSIS — Z1231 Encounter for screening mammogram for malignant neoplasm of breast: Secondary | ICD-10-CM

## 2014-06-07 ENCOUNTER — Encounter: Payer: Self-pay | Admitting: Emergency Medicine

## 2014-06-23 ENCOUNTER — Other Ambulatory Visit: Payer: Self-pay | Admitting: Family Medicine

## 2014-06-23 ENCOUNTER — Encounter: Payer: Self-pay | Admitting: Internal Medicine

## 2014-06-23 MED ORDER — CYCLOBENZAPRINE HCL 10 MG PO TABS: 10.0000 mg | ORAL_TABLET | Freq: Three times a day (TID) | ORAL | Status: DC | PRN

## 2014-06-23 NOTE — Progress Notes (Signed)
Pt. With acute muscular injury of the lower left spine after coughing while bent.  Three days of ubiquitous Ibuprofen use has failed to improve matters.  On exam has some spasm and point tenderness of low back and gluteus. Will send in rx for Flexeril.

## 2014-06-25 ENCOUNTER — Encounter: Payer: Self-pay | Admitting: Internal Medicine

## 2014-06-25 ENCOUNTER — Other Ambulatory Visit: Payer: Self-pay | Admitting: Internal Medicine

## 2014-06-25 DIAGNOSIS — T148XXA Other injury of unspecified body region, initial encounter: Secondary | ICD-10-CM

## 2014-06-25 MED ORDER — METHOCARBAMOL 500 MG PO TABS: 500.0000 mg | ORAL_TABLET | Freq: Three times a day (TID) | ORAL | Status: DC | PRN

## 2014-08-08 ENCOUNTER — Encounter: Payer: Self-pay | Admitting: Internal Medicine

## 2014-08-09 ENCOUNTER — Other Ambulatory Visit: Payer: Self-pay | Admitting: Licensed Clinical Social Worker

## 2014-08-09 DIAGNOSIS — B20 Human immunodeficiency virus [HIV] disease: Secondary | ICD-10-CM

## 2014-08-09 MED ORDER — BENAZEPRIL-HYDROCHLOROTHIAZIDE 10-12.5 MG PO TABS: 1.0000 | ORAL_TABLET | Freq: Every day | ORAL | Status: DC

## 2014-08-09 MED ORDER — EFAVIRENZ-EMTRICITAB-TENOFOVIR 600-200-300 MG PO TABS: 1.0000 | ORAL_TABLET | Freq: Every day | ORAL | Status: DC

## 2014-09-07 ENCOUNTER — Encounter: Payer: Self-pay | Admitting: Women's Health

## 2014-09-07 ENCOUNTER — Ambulatory Visit (INDEPENDENT_AMBULATORY_CARE_PROVIDER_SITE_OTHER): Payer: 59 | Admitting: Women's Health

## 2014-09-07 VITALS — BP 120/72 | Ht 65.0 in | Wt 117.0 lb

## 2014-09-07 DIAGNOSIS — B9689 Other specified bacterial agents as the cause of diseases classified elsewhere: Secondary | ICD-10-CM

## 2014-09-07 DIAGNOSIS — N760 Acute vaginitis: Secondary | ICD-10-CM

## 2014-09-07 DIAGNOSIS — R350 Frequency of micturition: Secondary | ICD-10-CM

## 2014-09-07 DIAGNOSIS — A499 Bacterial infection, unspecified: Secondary | ICD-10-CM

## 2014-09-07 DIAGNOSIS — B373 Candidiasis of vulva and vagina: Secondary | ICD-10-CM

## 2014-09-07 DIAGNOSIS — B3731 Acute candidiasis of vulva and vagina: Secondary | ICD-10-CM

## 2014-09-07 DIAGNOSIS — N898 Other specified noninflammatory disorders of vagina: Secondary | ICD-10-CM

## 2014-09-07 DIAGNOSIS — N912 Amenorrhea, unspecified: Secondary | ICD-10-CM

## 2014-09-07 LAB — URINALYSIS W MICROSCOPIC + REFLEX CULTURE
BILIRUBIN URINE: NEGATIVE
CASTS: NONE SEEN
Crystals: NONE SEEN
GLUCOSE, UA: NEGATIVE mg/dL
Ketones, ur: NEGATIVE mg/dL
Nitrite: NEGATIVE
PROTEIN: NEGATIVE mg/dL
Urobilinogen, UA: 0.2 mg/dL (ref 0.0–1.0)
pH: 7 (ref 5.0–8.0)

## 2014-09-07 LAB — WET PREP FOR TRICH, YEAST, CLUE: Trich, Wet Prep: NONE SEEN

## 2014-09-07 LAB — PREGNANCY, URINE: Preg Test, Ur: NEGATIVE

## 2014-09-07 MED ORDER — METRONIDAZOLE 0.75 % VA GEL: VAGINAL | Status: DC

## 2014-09-07 MED ORDER — FLUCONAZOLE 150 MG PO TABS: 150.0000 mg | ORAL_TABLET | Freq: Once | ORAL | Status: DC

## 2014-09-07 NOTE — Patient Instructions (Signed)
Bacterial Vaginosis Bacterial vaginosis is an infection of the vagina. It happens when too many of certain germs (bacteria) grow in the vagina. HOME CARE  Take your medicine as told by your doctor.  Finish your medicine even if you start to feel better.  Do not have sex until you finish your medicine and are better.  Tell your sex partner that you have an infection. They should see their doctor for treatment.  Practice safe sex. Use condoms. Have only one sex partner. GET HELP IF:  You are not getting better after 3 days of treatment.  You have more grey fluid (discharge) coming from your vagina than before.  You have more pain than before.  You have a fever. MAKE SURE YOU:   Understand these instructions.  Will watch your condition.  Will get help right away if you are not doing well or get worse. Document Released: 05/01/2008 Document Revised: 05/13/2013 Document Reviewed: 03/04/2013 [facility] Patient Information 2015 [place], [place]. This information is not intended to replace advice given to you by your health care provider. Make sure you discuss any questions you have with your health care provider. Menopause Menopause is the normal time of life when menstrual periods stop completely. Menopause is complete when you have missed 12 consecutive menstrual periods. It usually occurs between the ages of 63 years and 31 years. Very rarely does a woman develop menopause before the age of 16 years. At menopause, your ovaries stop producing the female hormones estrogen and progesterone. This can cause undesirable symptoms and also affect your health. Sometimes the symptoms may occur 4-5 years before the menopause begins. There is no relationship between menopause and:  Oral contraceptives.  Number of children you had.  Race.  The age your menstrual periods started (menarche). Heavy smokers and very thin women may develop menopause earlier in life. CAUSES  The ovaries stop  producing the female hormones estrogen and progesterone.  Other causes include:  Surgery to remove both ovaries.  The ovaries stop functioning for no known reason.  Tumors of the pituitary gland in the brain.  Medical disease that affects the ovaries and hormone production.  Radiation treatment to the abdomen or pelvis.  Chemotherapy that affects the ovaries. SYMPTOMS   Hot flashes.  Night sweats.  Decrease in sex drive.  Vaginal dryness and thinning of the vagina causing painful intercourse.  Dryness of the skin and developing wrinkles.  Headaches.  Tiredness.  Irritability.  Memory problems.  Weight gain.  Bladder infections.  Hair growth of the face and chest.  Infertility. More serious symptoms include:  Loss of bone (osteoporosis) causing breaks (fractures).  Depression.  Hardening and narrowing of the arteries (atherosclerosis) causing heart attacks and strokes. DIAGNOSIS   When the menstrual periods have stopped for 12 straight months.  Physical exam.  Hormone studies of the blood. TREATMENT  There are many treatment choices and nearly as many questions about them. The decisions to treat or not to treat menopausal changes is an individual choice made with your health care provider. Your health care provider can discuss the treatments with you. Together, you can decide which treatment will work best for you. Your treatment choices may include:   Hormone therapy (estrogen and progesterone).  Non-hormonal medicines.  Treating the individual symptoms with medicine (for example antidepressants for depression).  Herbal medicines that may help specific symptoms.  Counseling by a psychiatrist or psychologist.  Group therapy.  Lifestyle changes including:  Eating healthy.  Regular exercise.  Limiting  caffeine and alcohol.  Stress management and meditation.  No treatment. HOME CARE INSTRUCTIONS   Take the medicine your health care  provider gives you as directed.  Get plenty of sleep and rest.  Exercise regularly.  Eat a diet that contains calcium (good for the bones) and soy products (acts like estrogen hormone).  Avoid alcoholic beverages.  Do not smoke.  If you have hot flashes, dress in layers.  Take supplements, calcium, and vitamin D to strengthen bones.  You can use over-the-counter lubricants or moisturizers for vaginal dryness.  Group therapy is sometimes very helpful.  Acupuncture may be helpful in some cases. SEEK MEDICAL CARE IF:   You are not sure you are in menopause.  You are having menopausal symptoms and need advice and treatment.  You are still having menstrual periods after age 60 years.  You have pain with intercourse.  Menopause is complete (no menstrual period for 12 months) and you develop vaginal bleeding.  You need a referral to a specialist (gynecologist, psychiatrist, or psychologist) for treatment. SEEK IMMEDIATE MEDICAL CARE IF:   You have severe depression.  You have excessive vaginal bleeding.  You fell and think you have a broken bone.  You have pain when you urinate.  You develop leg or chest pain.  You have a fast pounding heart beat (palpitations).  You have severe headaches.  You develop vision problems.  You feel a lump in your breast.  You have abdominal pain or severe indigestion. Document Released: 10/13/2003 Document Revised: 03/25/2013 Document Reviewed: 02/19/2013 [facility] Patient Information 2015 [place], [place]. This information is not intended to replace advice given to you by your health care provider. Make sure you discuss any questions you have with your health care provider.

## 2014-09-07 NOTE — Progress Notes (Signed)
Patient ID: EMMANUEL ERCOLE, female   DOB: Dec 28, 1965, 49 y.o.   MRN: [id number] Presents with complaint of December cycle lasting 2 weeks,  and now amenorrhea. Has had low abdominal cramping, bloating like she has prior to starting cycle. Condoms consistently for contraception, regular monthly 5 day cycles until December. Denies urinary symptoms, minimal vaginal discharge with mild irritation. Denies fever, occasional hot flushes.  Exam: Appears well. Abdomen soft nontender, external genitalia within normal limits mild erythema at introitus, speculum exam moderate amount of milky discharge noted wet prep positive for yeast, amines, clues, TNTC bacteria. Bimanual no CMT or adnexal fullness or tenderness. Uterus small. UA: Small blood, small leukocytes, 11-20 WBCs, 3-6 RBCs, many bacteria. UPT-negative  Bacteria vaginosis and yeast Amenorrhea  Plan: FSH, urine culture pending. MetroGel vaginal cream 1 applicator at bedtime 5, alcohol precautions reviewed, Diflucan 150 by mouth 1 dose. Instructed to call if no relief of discharge will triage based on [facility] results.

## 2014-09-08 LAB — FOLLICLE STIMULATING HORMONE: FSH: 4.6 m[IU]/mL

## 2014-09-09 ENCOUNTER — Encounter: Payer: Self-pay | Admitting: Women's Health

## 2014-09-09 ENCOUNTER — Other Ambulatory Visit: Payer: Self-pay | Admitting: Women's Health

## 2014-09-09 LAB — URINE CULTURE
Colony Count: NO GROWTH
Organism ID, Bacteria: NO GROWTH

## 2014-09-09 MED ORDER — MEDROXYPROGESTERONE ACETATE 10 MG PO TABS: 10.0000 mg | ORAL_TABLET | Freq: Every day | ORAL | Status: DC

## 2014-10-13 ENCOUNTER — Other Ambulatory Visit: Payer: 59

## 2014-10-13 DIAGNOSIS — B20 Human immunodeficiency virus [HIV] disease: Secondary | ICD-10-CM

## 2014-10-13 LAB — COMPREHENSIVE METABOLIC PANEL
ALBUMIN: 4.2 g/dL (ref 3.5–5.2)
ALK PHOS: 37 U/L — AB (ref 39–117)
ALT: 11 U/L (ref 0–35)
AST: 16 U/L (ref 0–37)
BUN: 17 mg/dL (ref 6–23)
CALCIUM: 9.3 mg/dL (ref 8.4–10.5)
CHLORIDE: 105 meq/L (ref 96–112)
CO2: 25 mEq/L (ref 19–32)
Creat: 0.87 mg/dL (ref 0.50–1.10)
GLUCOSE: 99 mg/dL (ref 70–99)
POTASSIUM: 3.9 meq/L (ref 3.5–5.3)
SODIUM: 139 meq/L (ref 135–145)
Total Bilirubin: 0.5 mg/dL (ref 0.2–1.2)
Total Protein: 6.9 g/dL (ref 6.0–8.3)

## 2014-10-13 LAB — CBC WITH DIFFERENTIAL/PLATELET
BASOS ABS: 0 10*3/uL (ref 0.0–0.1)
Basophils Relative: 1 % (ref 0–1)
Eosinophils Absolute: 0.1 10*3/uL (ref 0.0–0.7)
Eosinophils Relative: 2 % (ref 0–5)
HCT: 38.3 % (ref 36.0–46.0)
Hemoglobin: 12.8 g/dL (ref 12.0–15.0)
LYMPHS PCT: 30 % (ref 12–46)
Lymphs Abs: 1.1 10*3/uL (ref 0.7–4.0)
MCH: 31.5 pg (ref 26.0–34.0)
MCHC: 33.4 g/dL (ref 30.0–36.0)
MCV: 94.3 fL (ref 78.0–100.0)
MONO ABS: 0.3 10*3/uL (ref 0.1–1.0)
MONOS PCT: 8 % (ref 3–12)
MPV: 9.6 fL (ref 8.6–12.4)
Neutro Abs: 2.2 10*3/uL (ref 1.7–7.7)
Neutrophils Relative %: 59 % (ref 43–77)
Platelets: 233 10*3/uL (ref 150–400)
RBC: 4.06 MIL/uL (ref 3.87–5.11)
RDW: 13 % (ref 11.5–15.5)
WBC: 3.8 10*3/uL — ABNORMAL LOW (ref 4.0–10.5)

## 2014-10-14 LAB — T-HELPER CELL (CD4) - (RCID CLINIC ONLY)
CD4 T CELL HELPER: 54 % (ref 33–55)
CD4 T Cell Abs: 730 /uL (ref 400–2700)

## 2014-10-15 LAB — HIV-1 RNA QUANT-NO REFLEX-BLD: HIV-1 RNA Quant, Log: <1.3 {Log} (ref <1.30)

## 2014-10-27 ENCOUNTER — Ambulatory Visit (INDEPENDENT_AMBULATORY_CARE_PROVIDER_SITE_OTHER): Payer: 59 | Admitting: Internal Medicine

## 2014-10-27 ENCOUNTER — Encounter: Payer: Self-pay | Admitting: Internal Medicine

## 2014-10-27 VITALS — BP 93/66 | HR 90 | Temp 98.2°F | Ht 65.5 in | Wt 116.0 lb

## 2014-10-27 DIAGNOSIS — B20 Human immunodeficiency virus [HIV] disease: Secondary | ICD-10-CM

## 2014-10-27 DIAGNOSIS — I10 Essential (primary) hypertension: Secondary | ICD-10-CM | POA: Diagnosis not present

## 2014-10-27 NOTE — Progress Notes (Signed)
Patient ID: Cynthia [name], female   DOB: [birth year], 49 y.o.   MRN: [id number]       Patient ID: Cynthia [name], female   DOB: Jul 18, 1966, 49 y.o.   MRN: [id number]  HPI  49yo F with HIV disease, hx of retinitis, CD 4 count of 730/VL<20, on atripla. Doing well with adherence. She recently had delay in her menses, and was found to have BV. She was treated and menstrual cycle has been regular. No other illnesses  Outpatient Encounter Prescriptions as of 10/27/2014  Medication Sig  . benazepril-hydrochlorthiazide (LOTENSIN HCT) 10-12.5 MG per tablet Take 1 tablet by mouth daily.  . Cholecalciferol (VITAMIN D) 2000 UNITS tablet Take 2,000 Units by mouth daily.    . cyclobenzaprine (FLEXERIL) 10 MG tablet Take 1 tablet (10 mg total) by mouth every 8 (eight) hours as needed for muscle spasms.  Marland Kitchen efavirenz-emtricitabine-tenofovir (ATRIPLA) 600-200-300 MG per tablet Take 1 tablet by mouth at bedtime.  . [DISCONTINUED] fluconazole (DIFLUCAN) 150 MG tablet Take 1 tablet (150 mg total) by mouth once.  . [DISCONTINUED] medroxyPROGESTERone (PROVERA) 10 MG tablet Take 1 tablet (10 mg total) by mouth daily.  . [DISCONTINUED] metroNIDAZOLE (METROGEL VAGINAL) 0.75 % vaginal gel 1 applicator per vagina at HS x 5     Patient Active Problem List   Diagnosis Date Noted  . CMV retinitis   . Hypertension   . HIV DISEASE 11/07/2006  . DISEASE, HYPERTENSIVE HEART, BENIGN, W/HF 11/07/2006     Health Maintenance Due  Topic Date Due  . TETANUS/TDAP  11/09/1986     Review of Systems  Constitutional: Negative for fever, chills, diaphoresis, activity change, appetite change, fatigue and unexpected weight change.  HENT: Negative for congestion, sore throat, rhinorrhea, sneezing, trouble swallowing and sinus pressure.  Eyes: Negative for photophobia and visual disturbance.  Respiratory: Negative for cough, chest tightness, shortness of breath, wheezing and stridor.  Cardiovascular: Negative for chest pain,  palpitations and leg swelling.  Gastrointestinal: Negative for nausea, vomiting, abdominal pain, diarrhea, constipation, blood in stool, abdominal distention and anal bleeding.  Genitourinary: Negative for dysuria, hematuria, flank pain and difficulty urinating.  Musculoskeletal: Negative for myalgias, back pain, joint swelling, arthralgias and gait problem.  Skin: Negative for color change, pallor, rash and wound.  Neurological: Negative for dizziness, tremors, weakness and light-headedness.  Hematological: Negative for adenopathy. Does not bruise/bleed easily.  Psychiatric/Behavioral: Negative for behavioral problems, confusion, sleep disturbance, dysphoric mood, decreased concentration and agitation.    Physical Exam   BP 93/66 mmHg  Pulse 90  Temp(Src) 98.2 F (36.8 C) (Oral)  Ht 5' 5.5" (1.664 m)  Wt 116 lb (52.617 kg)  BMI 19.00 kg/m2  LMP 10/06/2014 Physical Exam  Constitutional: He is oriented to person, place, and time. He appears well-developed and well-nourished. No distress.  HENT: minor facial wasting, left eye pupil defect Mouth/Throat: Oropharynx is clear and moist. No oropharyngeal exudate.  Cardiovascular: Normal rate, regular rhythm and normal heart sounds. Exam reveals no gallop and no friction rub.  No murmur heard.  Pulmonary/Chest: Effort normal and breath sounds normal. No respiratory distress. He has no wheezes.  Abdominal: Soft. Bowel sounds are normal. He exhibits no distension. There is no tenderness.  Lymphadenopathy:  He has no cervical adenopathy.  Neurological: He is alert and oriented to person, place, and time.  Skin: Skin is warm and dry. No rash noted. No erythema.  Psychiatric: He has a normal mood and affect. His behavior is normal.    Lab Results  Component Value Date   CD4TCELL 54 10/13/2014   Lab Results  Component Value Date   CD4TABS 730 10/13/2014   CD4TABS 780 02/03/2014   CD4TABS 510 05/13/2013   Lab Results  Component Value  Date   HIV1RNAQUANT <20 10/13/2014   Lab Results  Component Value Date   HEPBSAB No 09/30/2006   No results found for: RPR  CBC Lab Results  Component Value Date   WBC 3.8* 10/13/2014   RBC 4.06 10/13/2014   HGB 12.8 10/13/2014   HCT 38.3 10/13/2014   PLT 233 10/13/2014   MCV 94.3 10/13/2014   MCH 31.5 10/13/2014   MCHC 33.4 10/13/2014   RDW 13.0 10/13/2014   LYMPHSABS 1.1 10/13/2014   MONOABS 0.3 10/13/2014   EOSABS 0.1 10/13/2014   BASOSABS 0.0 10/13/2014   BMET Lab Results  Component Value Date   NA 139 10/13/2014   K 3.9 10/13/2014   CL 105 10/13/2014   CO2 25 10/13/2014   GLUCOSE 99 10/13/2014   BUN 17 10/13/2014   CREATININE 0.87 10/13/2014   CALCIUM 9.3 10/13/2014   GFRNONAA 64 02/03/2014   GFRAA 74 02/03/2014     Assessment and Plan  hiv disease = well controlled, give co pay card for atripla but will consider genvoya or odefsey  cmv retinitis = no longer needing treatment  htn = well controlled with benazepril-hctz  Lipodystrophy due to hiv drugs = continue to monitor,

## 2014-12-06 ENCOUNTER — Encounter: Payer: Self-pay | Admitting: Internal Medicine

## 2014-12-07 ENCOUNTER — Other Ambulatory Visit: Payer: Self-pay | Admitting: Internal Medicine

## 2014-12-07 ENCOUNTER — Encounter: Payer: Self-pay | Admitting: Internal Medicine

## 2014-12-07 DIAGNOSIS — B20 Human immunodeficiency virus [HIV] disease: Secondary | ICD-10-CM

## 2014-12-07 MED ORDER — ELVITEG-COBIC-EMTRICIT-TENOFAF 150-150-200-10 MG PO TABS: 1.0000 | ORAL_TABLET | Freq: Every day | ORAL | Status: DC

## 2015-02-09 ENCOUNTER — Telehealth: Payer: Self-pay | Admitting: *Deleted

## 2015-02-09 NOTE — Telephone Encounter (Signed)
Lotensin rx no longer available at [facility].  [place] will need to have 2 rxes called to pharmacy for the Lotensin components.  Left message for the pt to call back about a response to the 2 rxes or to try another pharmacy for availability of her current Lotensin rx.

## 2015-02-10 ENCOUNTER — Other Ambulatory Visit: Payer: Self-pay | Admitting: *Deleted

## 2015-02-10 DIAGNOSIS — I10 Essential (primary) hypertension: Secondary | ICD-10-CM

## 2015-02-10 MED ORDER — BENAZEPRIL-HYDROCHLOROTHIAZIDE 10-12.5 MG PO TABS: 1.0000 | ORAL_TABLET | Freq: Every day | ORAL | Status: DC

## 2015-02-22 ENCOUNTER — Other Ambulatory Visit: Payer: Self-pay | Admitting: Internal Medicine

## 2015-02-22 DIAGNOSIS — I10 Essential (primary) hypertension: Secondary | ICD-10-CM

## 2015-02-22 MED ORDER — HYDROCHLOROTHIAZIDE 12.5 MG PO CAPS: 12.5000 mg | ORAL_CAPSULE | Freq: Every day | ORAL | Status: DC

## 2015-02-22 MED ORDER — LISINOPRIL 10 MG PO TABS: 10.0000 mg | ORAL_TABLET | Freq: Every day | ORAL | Status: DC

## 2015-02-22 NOTE — Progress Notes (Signed)
Requested to get ind components of bp meds thru [facility] pharmacy 90 day supply

## 2015-02-22 NOTE — Telephone Encounter (Signed)
i spoke with patient. lotensin only able to get 30 day supply, so i sent in rx that changed back to ind components at [facility] for 90 d supply as cheaper alternative

## 2015-02-28 ENCOUNTER — Other Ambulatory Visit: Payer: Self-pay | Admitting: Internal Medicine

## 2015-03-09 ENCOUNTER — Encounter: Payer: Self-pay | Admitting: Internal Medicine

## 2015-03-14 ENCOUNTER — Other Ambulatory Visit: Payer: Self-pay | Admitting: Licensed Clinical Social Worker

## 2015-03-14 DIAGNOSIS — I10 Essential (primary) hypertension: Secondary | ICD-10-CM

## 2015-03-14 MED ORDER — HYDROCHLOROTHIAZIDE 12.5 MG PO CAPS: 12.5000 mg | ORAL_CAPSULE | Freq: Every day | ORAL | Status: DC

## 2015-03-14 MED ORDER — LISINOPRIL 10 MG PO TABS: 10.0000 mg | ORAL_TABLET | Freq: Every day | ORAL | Status: DC

## 2015-04-06 ENCOUNTER — Other Ambulatory Visit: Payer: Self-pay | Admitting: *Deleted

## 2015-04-08 ENCOUNTER — Ambulatory Visit (INDEPENDENT_AMBULATORY_CARE_PROVIDER_SITE_OTHER): Payer: 59 | Admitting: Women's Health

## 2015-04-08 ENCOUNTER — Other Ambulatory Visit (HOSPITAL_COMMUNITY)
Admission: RE | Admit: 2015-04-08 | Discharge: 2015-04-08 | Disposition: A | Discharge status: Home / Self Care | Payer: 59 | Source: Ambulatory Visit | Attending: Women's Health | Admitting: Women's Health

## 2015-04-08 ENCOUNTER — Encounter: Payer: Self-pay | Admitting: Women's Health

## 2015-04-08 VITALS — BP 118/80 | Ht 65.0 in | Wt 115.0 lb

## 2015-04-08 DIAGNOSIS — Z1151 Encounter for screening for human papillomavirus (HPV): Secondary | ICD-10-CM | POA: Insufficient documentation

## 2015-04-08 DIAGNOSIS — Z01419 Encounter for gynecological examination (general) (routine) without abnormal findings: Secondary | ICD-10-CM | POA: Insufficient documentation

## 2015-04-08 NOTE — Progress Notes (Signed)
Cynthia [name] 1966/01/29 [id number]    History:    Presents for annual exam.  Monthly cycle/condoms. 1990 diagnosed with HIV CD4 count good. Contracted HIV from first husband second husband HIV negative. Normal Pap and mammogram history.  Past medical history, past surgical history, family history and social history were all reviewed and documented in the EPIC chart. Works at [employer]. Father hypertension and diabetes. Maternal grandmother colon cancer.  ROS:  A ROS was performed and pertinent positives and negatives are included.  Exam:  Filed Vitals:   04/08/15 1356  BP: 118/80    General appearance:  Normal Thyroid:  Symmetrical, normal in size, without palpable masses or nodularity. Respiratory  Auscultation:  Clear without wheezing or rhonchi Cardiovascular  Auscultation:  Regular rate, without rubs, murmurs or gallops  Edema/varicosities:  Not grossly evident Abdominal  Soft,nontender, without masses, guarding or rebound.  Liver/spleen:  No organomegaly noted  Hernia:  None appreciated  Skin  Inspection:  Grossly normal   Breasts: Examined lying and sitting.     Right: Without masses, retractions, discharge or axillary adenopathy.     Left: Without masses, retractions, discharge or axillary adenopathy. Gentitourinary   Inguinal/mons:  Normal without inguinal adenopathy  External genitalia:  Normal  BUS/Urethra/Skene's glands:  Normal  Vagina:  Normal  Cervix:  Normal  Uterus:  normal in size, shape and contour.  Midline and mobile  Adnexa/parametria:     Rt: Without masses or tenderness.   Lt: Without masses or tenderness.  Anus and perineum: Normal  Digital rectal exam: Normal sphincter tone without palpated masses or tenderness  Assessment/Plan:  49 y.o. MWF G1 P1 for annual exam with no complaints.  Regular monthly cycle/condoms 1990 HIV positive stable doing well CD4 count good-Dr Baxter Flattery Hypertension-primary care  Plan: SBE's, continue annual 3-D mammogram,  history of dense breast. Exercise, calcium rich diet, vitamin D 1000 daily encouraged. Lipid panel, vitamin D, UA, Pap.    Huel Cote [facility], 2:23 PM 04/08/2015

## 2015-04-08 NOTE — Addendum Note (Signed)
Addended by: Burnett Kanaris on: 04/08/2015 03:01 PM   Modules accepted: Orders

## 2015-04-08 NOTE — Patient Instructions (Signed)

## 2015-04-09 LAB — URINALYSIS W MICROSCOPIC + REFLEX CULTURE
Bacteria, UA: NONE SEEN [HPF]
Bilirubin Urine: NEGATIVE
CASTS: NONE SEEN [LPF]
Crystals: NONE SEEN [HPF]
Glucose, UA: NEGATIVE
Ketones, ur: NEGATIVE
LEUKOCYTES UA: NEGATIVE
NITRITE: NEGATIVE
PH: 6.5 (ref 5.0–8.0)
Protein, ur: NEGATIVE
SPECIFIC GRAVITY, URINE: 1.017 (ref 1.001–1.035)
WBC, UA: NONE SEEN WBC/HPF (ref <=5)
YEAST: NONE SEEN [HPF]

## 2015-04-10 LAB — URINE CULTURE
Colony Count: NO GROWTH
Organism ID, Bacteria: NO GROWTH

## 2015-04-13 ENCOUNTER — Other Ambulatory Visit: Payer: 59

## 2015-04-14 LAB — CYTOLOGY - PAP

## 2015-04-17 ENCOUNTER — Encounter: Payer: Self-pay | Admitting: Women's Health

## 2015-04-27 ENCOUNTER — Other Ambulatory Visit: Payer: 59

## 2015-04-27 DIAGNOSIS — B20 Human immunodeficiency virus [HIV] disease: Secondary | ICD-10-CM

## 2015-04-27 LAB — CBC WITH DIFFERENTIAL/PLATELET
BASOS PCT: 0 % (ref 0–1)
Basophils Absolute: 0 10*3/uL (ref 0.0–0.1)
Eosinophils Absolute: 0.1 10*3/uL (ref 0.0–0.7)
Eosinophils Relative: 2 % (ref 0–5)
HCT: 34.9 % — ABNORMAL LOW (ref 36.0–46.0)
HEMOGLOBIN: 12.3 g/dL (ref 12.0–15.0)
LYMPHS PCT: 32 % (ref 12–46)
Lymphs Abs: 1.8 10*3/uL (ref 0.7–4.0)
MCH: 32.6 pg (ref 26.0–34.0)
MCHC: 35.2 g/dL (ref 30.0–36.0)
MCV: 92.6 fL (ref 78.0–100.0)
MONOS PCT: 7 % (ref 3–12)
MPV: 9.6 fL (ref 8.6–12.4)
Monocytes Absolute: 0.4 10*3/uL (ref 0.1–1.0)
NEUTROS ABS: 3.2 10*3/uL (ref 1.7–7.7)
NEUTROS PCT: 59 % (ref 43–77)
Platelets: 210 10*3/uL (ref 150–400)
RBC: 3.77 MIL/uL — AB (ref 3.87–5.11)
RDW: 13.2 % (ref 11.5–15.5)
WBC: 5.5 10*3/uL (ref 4.0–10.5)

## 2015-04-27 LAB — COMPLETE METABOLIC PANEL WITH GFR
ALBUMIN: 4.3 g/dL (ref 3.6–5.1)
ALK PHOS: 28 U/L — AB (ref 33–115)
ALT: 8 U/L (ref 6–29)
AST: 13 U/L (ref 10–35)
BILIRUBIN TOTAL: 0.4 mg/dL (ref 0.2–1.2)
BUN: 20 mg/dL (ref 7–25)
CO2: 27 mmol/L (ref 20–31)
Calcium: 9.2 mg/dL (ref 8.6–10.2)
Chloride: 101 mmol/L (ref 98–110)
Creat: 1.05 mg/dL (ref 0.50–1.10)
GFR, Est African American: 73 mL/min (ref >=60)
GFR, Est Non African American: 63 mL/min (ref >=60)
GLUCOSE: 68 mg/dL (ref 65–99)
Potassium: 3.9 mmol/L (ref 3.5–5.3)
SODIUM: 137 mmol/L (ref 135–146)
TOTAL PROTEIN: 6.7 g/dL (ref 6.1–8.1)

## 2015-04-28 LAB — RPR

## 2015-04-28 LAB — T-HELPER CELL (CD4) - (RCID CLINIC ONLY)
CD4 T CELL ABS: 810 /uL (ref 400–2700)
CD4 T CELL HELPER: 49 % (ref 33–55)

## 2015-04-28 LAB — HIV-1 RNA QUANT-NO REFLEX-BLD: HIV-1 RNA Quant, Log: <1.3 {Log} (ref <1.30)

## 2015-05-12 ENCOUNTER — Encounter: Payer: Self-pay | Admitting: Internal Medicine

## 2015-05-12 ENCOUNTER — Ambulatory Visit (INDEPENDENT_AMBULATORY_CARE_PROVIDER_SITE_OTHER): Payer: 59 | Admitting: Internal Medicine

## 2015-05-12 VITALS — BP 107/67 | HR 80 | Temp 98.1°F | Wt 116.0 lb

## 2015-05-12 DIAGNOSIS — K5900 Constipation, unspecified: Secondary | ICD-10-CM

## 2015-05-12 DIAGNOSIS — I10 Essential (primary) hypertension: Secondary | ICD-10-CM

## 2015-05-12 DIAGNOSIS — B20 Human immunodeficiency virus [HIV] disease: Secondary | ICD-10-CM | POA: Diagnosis not present

## 2015-05-12 NOTE — Progress Notes (Signed)
Patient ID: Cynthia [name], female   DOB: 07/23/1966, 49 y.o.   MRN: [id number]       Patient ID: Cynthia [name], female   DOB: [birth year], 49 y.o.   MRN: [id number]  HPI 49yo F with HIV disease, HTN, CD 4 count of 810/VL<20, on genvoya. Noticing having constipation that she attributes to genvoya. Otherwise no other side effect from medication. She has noticed gaining some weight.  Outpatient Encounter Prescriptions as of 05/12/2015  Medication Sig  . Cholecalciferol (VITAMIN D) 2000 UNITS tablet Take 2,000 Units by mouth daily.    . cyclobenzaprine (FLEXERIL) 10 MG tablet Take 1 tablet (10 mg total) by mouth every 8 (eight) hours as needed for muscle spasms.  Marland Kitchen elvitegravir-cobicistat-emtricitabine-tenofovir (GENVOYA) 150-150-200-10 MG TABS tablet Take 1 tablet by mouth daily with breakfast.  . hydrochlorothiazide (MICROZIDE) 12.5 MG capsule Take 1 capsule (12.5 mg total) by mouth daily.  Marland Kitchen lisinopril (ZESTRIL) 10 MG tablet Take 1 tablet (10 mg total) by mouth daily.   No facility-administered encounter medications on file as of 05/12/2015.     Patient Active Problem List   Diagnosis Date Noted  . CMV retinitis ([place])   . Hypertension   . HIV DISEASE 11/07/2006  . DISEASE, HYPERTENSIVE HEART, BENIGN, W/HF 11/07/2006     Health Maintenance Due  Topic Date Due  . TETANUS/TDAP  11/09/1986  . INFLUENZA VACCINE  03/07/2015     Review of Systems Review of Systems  Constitutional: Negative for fever, chills, diaphoresis, activity change, appetite change, fatigue and unexpected weight change.  HENT: Negative for congestion, sore throat, rhinorrhea, sneezing, trouble swallowing and sinus pressure.  Eyes: Negative for photophobia and visual disturbance.  Respiratory: Negative for cough, chest tightness, shortness of breath, wheezing and stridor.  Cardiovascular: Negative for chest pain, palpitations and leg swelling.  Gastrointestinal: + constipation Genitourinary: Negative for dysuria,  hematuria, flank pain and difficulty urinating.  Musculoskeletal: Negative for myalgias, back pain, joint swelling, arthralgias and gait problem.  Skin: Negative for color change, pallor, rash and wound.  Neurological: Negative for dizziness, tremors, weakness and light-headedness.  Hematological: Negative for adenopathy. Does not bruise/bleed easily.  Psychiatric/Behavioral: Negative for behavioral problems, confusion, sleep disturbance, dysphoric mood, decreased concentration and agitation.    Physical Exam   BP 107/67 mmHg  Pulse 80  Temp(Src) 98.1 F (36.7 C) (Oral)  Wt 116 lb (52.617 kg)  LMP 05/10/2015 Physical Exam  Constitutional:  oriented to person, place, and time. appears well-developed and well-nourished. No distress.  HENT: [place]/AT, PERRLA, no scleral icterus Mouth/Throat: Oropharynx is clear and moist. No oropharyngeal exudate.  Cardiovascular: Normal rate, regular rhythm and normal heart sounds. Exam reveals no gallop and no friction rub.  No murmur heard.  Pulmonary/Chest: Effort normal and breath sounds normal. No respiratory distress.  has no wheezes.  Neck = supple, no nuchal rigidity Abdominal: Soft. Bowel sounds are normal.  exhibits no distension. There is no tenderness.  Lymphadenopathy: no cervical adenopathy. No axillary adenopathy Neurological: alert and oriented to person, place, and time.  Skin: Skin is warm and dry. No rash noted. No erythema.  Psychiatric: a normal mood and affect.  behavior is normal.    Lab Results  Component Value Date   CD4TCELL 49 04/27/2015   Lab Results  Component Value Date   CD4TABS 810 04/27/2015   CD4TABS 730 10/13/2014   CD4TABS 780 02/03/2014   Lab Results  Component Value Date   HIV1RNAQUANT <20 04/27/2015   Lab Results  Component Value  Date   HEPBSAB No 09/30/2006   No results found for: RPR  CBC Lab Results  Component Value Date   WBC 5.5 04/27/2015   RBC 3.77* 04/27/2015   HGB 12.3 04/27/2015   HCT  34.9* 04/27/2015   PLT 210 04/27/2015   MCV 92.6 04/27/2015   MCH 32.6 04/27/2015   MCHC 35.2 04/27/2015   RDW 13.2 04/27/2015   LYMPHSABS 1.8 04/27/2015   MONOABS 0.4 04/27/2015   EOSABS 0.1 04/27/2015   BASOSABS 0.0 04/27/2015   BMET Lab Results  Component Value Date   NA 137 04/27/2015   K 3.9 04/27/2015   CL 101 04/27/2015   CO2 27 04/27/2015   GLUCOSE 68 04/27/2015   BUN 20 04/27/2015   CREATININE 1.05 04/27/2015   CALCIUM 9.2 04/27/2015   GFRNONAA 63 04/27/2015   GFRAA 73 04/27/2015     Assessment and Plan  Hiv, asx= continue on genvoya  htn = will stop lisinopril  Constipation = can use colace  rtc in 6 mo, labs 2 wk before. No need for cd 4 count > 500

## 2015-06-16 ENCOUNTER — Other Ambulatory Visit: Payer: Self-pay

## 2015-06-16 DIAGNOSIS — Z1231 Encounter for screening mammogram for malignant neoplasm of breast: Secondary | ICD-10-CM

## 2015-08-03 ENCOUNTER — Ambulatory Visit
Admission: RE | Admit: 2015-08-03 | Discharge: 2015-08-03 | Disposition: A | Discharge status: Home / Self Care | Payer: 59 | Source: Ambulatory Visit

## 2015-08-03 DIAGNOSIS — Z1231 Encounter for screening mammogram for malignant neoplasm of breast: Secondary | ICD-10-CM

## 2015-08-04 ENCOUNTER — Encounter: Payer: Self-pay | Admitting: Women's Health

## 2015-09-02 MED FILL — GENVOYA TABLET: 150-150-200 | 30 days supply | Qty: 30 | Fill #9

## 2015-09-22 DIAGNOSIS — Z961 Presence of intraocular lens: Secondary | ICD-10-CM | POA: Diagnosis not present

## 2015-09-22 DIAGNOSIS — H33021 Retinal detachment with multiple breaks, right eye: Secondary | ICD-10-CM | POA: Diagnosis not present

## 2015-09-22 DIAGNOSIS — Z21 Asymptomatic human immunodeficiency virus [HIV] infection status: Secondary | ICD-10-CM | POA: Diagnosis not present

## 2015-10-03 MED FILL — GENVOYA TABLET: 150-150-200 | 30 days supply | Qty: 30 | Fill #10

## 2015-10-03 MED FILL — HYDROCHLOROTHIAZIDE 12.5 MG: 12.5 | 90 days supply | Qty: 90 | Fill #2

## 2015-10-31 MED FILL — GENVOYA TABLET: 150-150-200 | 30 days supply | Qty: 30 | Fill #11

## 2015-11-29 ENCOUNTER — Other Ambulatory Visit: Payer: Self-pay | Admitting: Internal Medicine

## 2015-11-29 DIAGNOSIS — B20 Human immunodeficiency virus [HIV] disease: Secondary | ICD-10-CM

## 2015-11-29 MED FILL — GENVOYA TABLET: 150-150-200 | 30 days supply | Qty: 30 | Fill #0

## 2016-01-03 MED FILL — GENVOYA TABLET: 150-150-200 | 30 days supply | Qty: 30 | Fill #1

## 2016-01-27 MED FILL — HYDROCHLOROTHIAZIDE 12.5 MG: 12.5 | 90 days supply | Qty: 90 | Fill #3

## 2016-01-27 MED FILL — GENVOYA TABLET: 150-150-200 | 30 days supply | Qty: 30 | Fill #2

## 2016-02-29 ENCOUNTER — Other Ambulatory Visit: Payer: Self-pay | Admitting: Internal Medicine

## 2016-02-29 ENCOUNTER — Other Ambulatory Visit: Payer: 59

## 2016-02-29 DIAGNOSIS — B20 Human immunodeficiency virus [HIV] disease: Secondary | ICD-10-CM

## 2016-02-29 LAB — CBC WITH DIFFERENTIAL/PLATELET
BASOS PCT: 1 %
Basophils Absolute: 44 cells/uL (ref 0–200)
EOS ABS: 88 {cells}/uL (ref 15–500)
EOS PCT: 2 %
HCT: 36.3 % (ref 35.0–45.0)
Hemoglobin: 12.3 g/dL (ref 11.7–15.5)
LYMPHS PCT: 30 %
Lymphs Abs: 1320 cells/uL (ref 850–3900)
MCH: 31.4 pg (ref 27.0–33.0)
MCHC: 33.9 g/dL (ref 32.0–36.0)
MCV: 92.6 fL (ref 80.0–100.0)
MONOS PCT: 8 %
MPV: 9.6 fL (ref 7.5–12.5)
Monocytes Absolute: 352 cells/uL (ref 200–950)
NEUTROS ABS: 2596 {cells}/uL (ref 1500–7800)
Neutrophils Relative %: 59 %
PLATELETS: 210 10*3/uL (ref 140–400)
RBC: 3.92 MIL/uL (ref 3.80–5.10)
RDW: 13.5 % (ref 11.0–15.0)
WBC: 4.4 10*3/uL (ref 3.8–10.8)

## 2016-02-29 NOTE — Addendum Note (Signed)
Addended byMeriel Pica F on: 02/29/2016 02:08 PM   Modules accepted: Orders

## 2016-03-01 LAB — LIPID PANEL
CHOL/HDL RATIO: 3.2 ratio (ref <=5.0)
Cholesterol: 198 mg/dL (ref 125–200)
HDL: 62 mg/dL (ref >=46)
LDL Cholesterol: 106 mg/dL (ref <130)
Triglycerides: 152 mg/dL — ABNORMAL HIGH (ref <150)
VLDL: 30 mg/dL (ref <30)

## 2016-03-01 LAB — T-HELPER CELL (CD4) - (RCID CLINIC ONLY)
CD4 T CELL ABS: 640 /uL (ref 400–2700)
CD4 T CELL HELPER: 50 % (ref 33–55)

## 2016-03-01 LAB — COMPREHENSIVE METABOLIC PANEL
ALT: 10 U/L (ref 6–29)
AST: 14 U/L (ref 10–35)
Albumin: 4.2 g/dL (ref 3.6–5.1)
Alkaline Phosphatase: 30 U/L — ABNORMAL LOW (ref 33–115)
BUN: 18 mg/dL (ref 7–25)
CHLORIDE: 102 mmol/L (ref 98–110)
CO2: 27 mmol/L (ref 20–31)
CREATININE: 1.15 mg/dL — AB (ref 0.50–1.10)
Calcium: 9.1 mg/dL (ref 8.6–10.2)
GLUCOSE: 87 mg/dL (ref 65–99)
POTASSIUM: 3.7 mmol/L (ref 3.5–5.3)
SODIUM: 138 mmol/L (ref 135–146)
Total Bilirubin: 0.4 mg/dL (ref 0.2–1.2)
Total Protein: 6.8 g/dL (ref 6.1–8.1)

## 2016-03-01 MED FILL — GENVOYA TABLET: 150-150-200 | 30 days supply | Qty: 30 | Fill #3

## 2016-03-02 LAB — URINE CYTOLOGY ANCILLARY ONLY
CHLAMYDIA, DNA PROBE: NEGATIVE
Neisseria Gonorrhea: NEGATIVE
TRICH (WINDOWPATH): NEGATIVE

## 2016-03-05 LAB — HIV-1 RNA QUANT-NO REFLEX-BLD

## 2016-03-20 ENCOUNTER — Encounter: Payer: Self-pay | Admitting: Internal Medicine

## 2016-03-20 ENCOUNTER — Ambulatory Visit (INDEPENDENT_AMBULATORY_CARE_PROVIDER_SITE_OTHER): Payer: 59 | Admitting: Internal Medicine

## 2016-03-20 VITALS — BP 134/87 | HR 81 | Temp 98.8°F | Wt 123.0 lb

## 2016-03-20 DIAGNOSIS — B20 Human immunodeficiency virus [HIV] disease: Secondary | ICD-10-CM

## 2016-03-20 DIAGNOSIS — Z79899 Other long term (current) drug therapy: Secondary | ICD-10-CM

## 2016-03-20 DIAGNOSIS — R635 Abnormal weight gain: Secondary | ICD-10-CM

## 2016-03-20 DIAGNOSIS — Z113 Encounter for screening for infections with a predominantly sexual mode of transmission: Secondary | ICD-10-CM | POA: Diagnosis not present

## 2016-03-20 DIAGNOSIS — I10 Essential (primary) hypertension: Secondary | ICD-10-CM

## 2016-03-20 MED ORDER — LISINOPRIL 10 MG PO TABS: 10.0000 mg | ORAL_TABLET | Freq: Every day | ORAL | 3 refills | Status: DC

## 2016-03-20 MED FILL — LISINOPRIL 10 MG TABLET: 10 | 90 days supply | Qty: 90 | Fill #0

## 2016-03-20 NOTE — Progress Notes (Signed)
Patient ID: Cynthia [name], female   DOB: 06/24/1966, 50 y.o.   MRN: [id number]  HPI 50 yo F with longstanding well controlled HIV disease, CD 4 count of 640/VL<20 on genvoya. We have switched her to genvoya in the Fall of 2016. She states that she has been taking the medications regularly but she feels that she has gained 7 lb since the Fall of 2016 that she attributes to the medication. She is also concerned that she is having fat distribution to base of neck and abdomen, which she is concerned that is due to her medication. She asks if she could be changed back to atripla. Her cd 4 count dropped down to 640 from 730-800.   Outpatient Encounter Prescriptions as of 03/20/2016  Medication Sig  . Cholecalciferol (VITAMIN D) 2000 UNITS tablet Take 2,000 Units by mouth daily.    . cyclobenzaprine (FLEXERIL) 10 MG tablet Take 1 tablet (10 mg total) by mouth every 8 (eight) hours as needed for muscle spasms.  . GENVOYA 150-150-200-10 MG TABS tablet TAKE 1 TABLET BY MOUTH DAILY WITH BREAKFAST.  . hydrochlorothiazide (MICROZIDE) 12.5 MG capsule Take 1 capsule (12.5 mg total) by mouth daily.  Marland Kitchen lisinopril (ZESTRIL) 10 MG tablet Take 1 tablet (10 mg total) by mouth daily. (Patient not taking: Reported on 03/20/2016)   No facility-administered encounter medications on file as of 03/20/2016.      Patient Active Problem List   Diagnosis Date Noted  . CMV retinitis ([place])   . Hypertension   . HIV DISEASE 11/07/2006  . DISEASE, HYPERTENSIVE HEART, BENIGN, W/HF 11/07/2006     Health Maintenance Due  Topic Date Due  . TETANUS/TDAP  11/09/1986  . INFLUENZA VACCINE  03/06/2016     Review of Systems As mentioned above, other is in good state of health. Has had increase stress, slightly less exercise Physical Exam   BP 134/87 (BP Location: Right Arm, Patient Position: Sitting, Cuff Size: Small)   Pulse 81   Temp 98.8 F (37.1 C)   Wt 123 lb (55.8 kg)   LMP 03/16/2016 (Exact Date)   SpO2 98%    BMI 20.47 kg/m  Physical Exam  Constitutional:  oriented to person, place, and time. appears well-developed and well-nourished. No distress.  HENT: [place]/AT, PERRLA, no scleral icterus Mouth/Throat: Oropharynx is clear and moist. No oropharyngeal exudate.  Cardiovascular: Normal rate, regular rhythm and normal heart sounds. Exam reveals no gallop and no friction rub.  No murmur heard.  Pulmonary/Chest: Effort normal and breath sounds normal. No respiratory distress.  has no wheezes.  Neck = supple, no nuchal rigidity Abdominal: Soft. Bowel sounds are normal.  exhibits no distension. There is no tenderness.  Lymphadenopathy: no cervical adenopathy. No axillary adenopathy Neurological: alert and oriented to person, place, and time.  Skin: Skin is warm and dry. No rash noted. No erythema.  Psychiatric: a normal mood and affect.  behavior is normal.   Lab Results  Component Value Date   CD4TCELL 50 02/29/2016   Lab Results  Component Value Date   CD4TABS 640 02/29/2016   CD4TABS 810 04/27/2015   CD4TABS 730 10/13/2014   Lab Results  Component Value Date   HIV1RNAQUANT <20 02/29/2016   Lab Results  Component Value Date   HEPBSAB No 09/30/2006   No results found for: RPR  CBC Lab Results  Component Value Date   WBC 4.4 02/29/2016   RBC 3.92 02/29/2016   HGB 12.3 02/29/2016   HCT 36.3 02/29/2016  PLT 210 02/29/2016   MCV 92.6 02/29/2016   MCH 31.4 02/29/2016   MCHC 33.9 02/29/2016   RDW 13.5 02/29/2016   LYMPHSABS 1,320 02/29/2016   MONOABS 352 02/29/2016   EOSABS 88 02/29/2016   BASOSABS 44 02/29/2016   BMET Lab Results  Component Value Date   NA 138 02/29/2016   K 3.7 02/29/2016   CL 102 02/29/2016   CO2 27 02/29/2016   GLUCOSE 87 02/29/2016   BUN 18 02/29/2016   CREATININE 1.15 (H) 02/29/2016   CALCIUM 9.1 02/29/2016   GFRNONAA 63 04/27/2015   GFRAA 73 04/27/2015     Assessment and Plan  HIV disease = discussed that her drop in CD 4 count which was  likely transient. Her regimen still remains to keep her virologically controlled. She is though very concerned about her weight gain/fat distribution, which potentially could be due to the medication.  I have asked her to see if she gets back to her regular exercise routine to see if that makes any difference  htn = slightly above goal, pt wants to restart low dose lisinopril 10mg  daily  Will see her back in 3 months to repeat labs. If wants to change, consider changing to TAF/emtricitabine/RPV single tablet

## 2016-03-30 MED FILL — GENVOYA TABLET: 150-150-200 | 30 days supply | Qty: 30 | Fill #4

## 2016-04-11 ENCOUNTER — Ambulatory Visit (INDEPENDENT_AMBULATORY_CARE_PROVIDER_SITE_OTHER): Payer: 59 | Admitting: Women's Health

## 2016-04-11 ENCOUNTER — Encounter: Payer: Self-pay | Admitting: Women's Health

## 2016-04-11 VITALS — BP 118/80 | Ht 65.0 in | Wt 123.0 lb

## 2016-04-11 DIAGNOSIS — D1800 Hemangioma unspecified site: Secondary | ICD-10-CM | POA: Diagnosis not present

## 2016-04-11 DIAGNOSIS — R140 Abdominal distension (gaseous): Secondary | ICD-10-CM | POA: Diagnosis not present

## 2016-04-11 DIAGNOSIS — Z01419 Encounter for gynecological examination (general) (routine) without abnormal findings: Secondary | ICD-10-CM | POA: Diagnosis not present

## 2016-04-11 DIAGNOSIS — L814 Other melanin hyperpigmentation: Secondary | ICD-10-CM | POA: Diagnosis not present

## 2016-04-11 DIAGNOSIS — D225 Melanocytic nevi of trunk: Secondary | ICD-10-CM | POA: Diagnosis not present

## 2016-04-11 DIAGNOSIS — L821 Other seborrheic keratosis: Secondary | ICD-10-CM | POA: Diagnosis not present

## 2016-04-11 NOTE — Progress Notes (Signed)
Cynthia [name] 05-Feb-1966 [id number]    History:    Presents for annual exam.  Continues with monthly cycles, condoms. 1990 HIV diagnosed, husband remains HIV negative. Has had a normal Pap and mammogram history. Hypertension managed by primary care. Has gained 8 pounds in the past year relates to change and HIV medication. 0 viral load, CD4 count good.  Past medical history, past surgical history, family history and social history were all reviewed and documented in the EPIC chart. 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 Father hypertension and diabetes. Jinny Blossom adopted a daughter from [place] with a physical handicap, pregnant and due in November.  ROS:  A ROS was performed and pertinent positives and negatives are included.  Exam:  Vitals:   04/11/16 1544  BP: 118/80  Weight: 123 lb (55.8 kg)  Height: 5\' 5"  (1.651 m)   Body mass index is 20.47 kg/m.   General appearance:  Normal Thyroid:  Symmetrical, normal in size, without palpable masses or nodularity. Respiratory  Auscultation:  Clear without wheezing or rhonchi Cardiovascular  Auscultation:  Regular rate, without rubs, murmurs or gallops  Edema/varicosities:  Not grossly evident Abdominal  Soft,nontender, without masses, guarding or rebound.  Liver/spleen:  No organomegaly noted  Hernia:  None appreciated  Skin  Inspection:  Grossly normal   Breasts: Examined lying and sitting.     Right: Without masses, retractions, discharge or axillary adenopathy.     Left: Without masses, retractions, discharge or axillary adenopathy. Gentitourinary   Inguinal/mons:  Normal without inguinal adenopathy  External genitalia:  Normal  BUS/Urethra/Skene's glands:  Normal  Vagina:  Normal  Cervix:  Normal  Uterus:   normal in size, shape and contour.  Midline and mobile  Adnexa/parametria:     Rt: Without masses or tenderness.   Lt: Without masses or tenderness.  Anus and perineum: Normal  Digital rectal exam: Normal sphincter tone  without palpated masses or tenderness  Assessment/Plan:  50 y.o. MWF G4 P1 for annual exam with complaint of occasional abdominal bloating from weight gain relates to change and HIV meds.   Monthly cycle/condoms 1990 HIV Dr. Baxter Flattery manages labs and meds Hypertension-primary care manages labs and meds Situational stress-office manager  Plan: Menstrual changes associated with menopause reviewed, SBE's, continue annual 3-D screening mammogram, calcium rich diet, vitamin D 1000 daily encouraged. Encouraged regular exercise, increasing leisure and delegating more at work. Screening colonoscopy at 28. Instructed to schedule ultrasound. Pap, Pap normal with negative HR HPV 2016.  [place], 5:18 PM 04/11/2016

## 2016-04-11 NOTE — Patient Instructions (Signed)
Health Maintenance, Female Adopting a healthy lifestyle and getting preventive care can go a long way to promote health and wellness. Talk with your health care provider about what schedule of regular examinations is right for you. This is a good chance for you to check in with your provider about disease prevention and staying healthy. In between checkups, there are plenty of things you can do on your own. Experts have done a lot of research about which lifestyle changes and preventive measures are most likely to keep you healthy. Ask your health care provider for more information. WEIGHT AND DIET  Eat a healthy diet  Be sure to include plenty of vegetables, fruits, low-fat dairy products, and lean protein.  Do not eat a lot of foods high in solid fats, added sugars, or salt.  Get regular exercise. This is one of the most important things you can do for your health.  Most adults should exercise for at least 150 minutes each week. The exercise should increase your heart rate and make you sweat (moderate-intensity exercise).  Most adults should also do strengthening exercises at least twice a week. This is in addition to the moderate-intensity exercise.  Maintain a healthy weight  Body mass index (BMI) is a measurement that can be used to identify possible weight problems. It estimates body fat based on height and weight. Your health care provider can help determine your BMI and help you achieve or maintain a healthy weight.  For females 20 years of age and older:   A BMI below 18.5 is considered underweight.  A BMI of 18.5 to 24.9 is normal.  A BMI of 25 to 29.9 is considered overweight.  A BMI of 30 and above is considered obese.  Watch levels of cholesterol and blood lipids  You should start having your blood tested for lipids and cholesterol at 48 years of age, then have this test every 5 years.  You may need to have your cholesterol levels checked more often if:  Your lipid  or cholesterol levels are high.  You are older than 48 years of age.  You are at high risk for heart disease.  CANCER SCREENING   Lung Cancer  Lung cancer screening is recommended for adults 55-80 years old who are at high risk for lung cancer because of a history of smoking.  A yearly low-dose CT scan of the lungs is recommended for people who:  Currently smoke.  Have quit within the past 15 years.  Have at least a 30-pack-year history of smoking. A pack year is smoking an average of one pack of cigarettes a day for 1 year.  Yearly screening should continue until it has been 15 years since you quit.  Yearly screening should stop if you develop a health problem that would prevent you from having lung cancer treatment.  Breast Cancer  Practice breast self-awareness. This means understanding how your breasts normally appear and feel.  It also means doing regular breast self-exams. Let your health care provider know about any changes, no matter how small.  If you are in your 20s or 30s, you should have a clinical breast exam (CBE) by a health care provider every 1-3 years as part of a regular health exam.  If you are 40 or older, have a CBE every year. Also consider having a breast X-ray (mammogram) every year.  If you have a family history of breast cancer, talk to your health care provider about genetic screening.  If you   are at high risk for breast cancer, talk to your health care provider about having an MRI and a mammogram every year.  Breast cancer gene (BRCA) assessment is recommended for women who have family members with BRCA-related cancers. BRCA-related cancers include:  Breast.  Ovarian.  Tubal.  Peritoneal cancers.  Results of the assessment will determine the need for genetic counseling and BRCA1 and BRCA2 testing. Cervical Cancer Your health care provider may recommend that you be screened regularly for cancer of the pelvic organs (ovaries, uterus, and  vagina). This screening involves a pelvic examination, including checking for microscopic changes to the surface of your cervix (Pap test). You may be encouraged to have this screening done every 3 years, beginning at age 21.  For women ages 30-65, health care providers may recommend pelvic exams and Pap testing every 3 years, or they may recommend the Pap and pelvic exam, combined with testing for human papilloma virus (HPV), every 5 years. Some types of HPV increase your risk of cervical cancer. Testing for HPV may also be done on women of any age with unclear Pap test results.  Other health care providers may not recommend any screening for nonpregnant women who are considered low risk for pelvic cancer and who do not have symptoms. Ask your health care provider if a screening pelvic exam is right for you.  If you have had past treatment for cervical cancer or a condition that could lead to cancer, you need Pap tests and screening for cancer for at least 20 years after your treatment. If Pap tests have been discontinued, your risk factors (such as having a new sexual partner) need to be reassessed to determine if screening should resume. Some women have medical problems that increase the chance of getting cervical cancer. In these cases, your health care provider may recommend more frequent screening and Pap tests. Colorectal Cancer  This type of cancer can be detected and often prevented.  Routine colorectal cancer screening usually begins at 48 years of age and continues through 48 years of age.  Your health care provider may recommend screening at an earlier age if you have risk factors for colon cancer.  Your health care provider may also recommend using home test kits to check for hidden blood in the stool.  A small camera at the end of a tube can be used to examine your colon directly (sigmoidoscopy or colonoscopy). This is done to check for the earliest forms of colorectal  cancer.  Routine screening usually begins at age 50.  Direct examination of the colon should be repeated every 5-10 years through 48 years of age. However, you may need to be screened more often if early forms of precancerous polyps or small growths are found. Skin Cancer  Check your skin from head to toe regularly.  Tell your health care provider about any new moles or changes in moles, especially if there is a change in a mole's shape or color.  Also tell your health care provider if you have a mole that is larger than the size of a pencil eraser.  Always use sunscreen. Apply sunscreen liberally and repeatedly throughout the day.  Protect yourself by wearing long sleeves, pants, a wide-brimmed hat, and sunglasses whenever you are outside. HEART DISEASE, DIABETES, AND HIGH BLOOD PRESSURE   High blood pressure causes heart disease and increases the risk of stroke. High blood pressure is more likely to develop in:  People who have blood pressure in the high end   of the normal range (130-139/85-89 mm Hg).  People who are overweight or obese.  People who are African American.  If you are 38-23 years of age, have your blood pressure checked every 3-5 years. If you are 61 years of age or older, have your blood pressure checked every year. You should have your blood pressure measured twice--once when you are at a hospital or clinic, and once when you are not at a hospital or clinic. Record the average of the two measurements. To check your blood pressure when you are not at a hospital or clinic, you can use:  An automated blood pressure machine at a pharmacy.  A home blood pressure monitor.  If you are between 45 years and 39 years old, ask your health care provider if you should take aspirin to prevent strokes.  Have regular diabetes screenings. This involves taking a blood sample to check your fasting blood sugar level.  If you are at a normal weight and have a low risk for diabetes,  have this test once every three years after 48 years of age.  If you are overweight and have a high risk for diabetes, consider being tested at a younger age or more often. PREVENTING INFECTION  Hepatitis B  If you have a higher risk for hepatitis B, you should be screened for this virus. You are considered at high risk for hepatitis B if:  You were born in a country where hepatitis B is common. Ask your health care provider which countries are considered high risk.  Your parents were born in a high-risk country, and you have not been immunized against hepatitis B (hepatitis B vaccine).  You have HIV or AIDS.  You use needles to inject street drugs.  You live with someone who has hepatitis B.  You have had sex with someone who has hepatitis B.  You get hemodialysis treatment.  You take certain medicines for conditions, including cancer, organ transplantation, and autoimmune conditions. Hepatitis C  Blood testing is recommended for:  Everyone born from [birth year] through 1963.  Anyone with known risk factors for hepatitis C. Sexually transmitted infections (STIs)  You should be screened for sexually transmitted infections (STIs) including gonorrhea and chlamydia if:  You are sexually active and are younger than 48 years of age.  You are older than 48 years of age and your health care provider tells you that you are at risk for this type of infection.  Your sexual activity has changed since you were last screened and you are at an increased risk for chlamydia or gonorrhea. Ask your health care provider if you are at risk.  If you do not have HIV, but are at risk, it may be recommended that you take a prescription medicine daily to prevent HIV infection. This is called pre-exposure prophylaxis (PrEP). You are considered at risk if:  You are sexually active and do not regularly use condoms or know the HIV status of your partner(s).  You take drugs by injection.  You are sexually  active with a partner who has HIV. Talk with your health care provider about whether you are at high risk of being infected with HIV. If you choose to begin PrEP, you should first be tested for HIV. You should then be tested every 3 months for as long as you are taking PrEP.  PREGNANCY   If you are premenopausal and you may become pregnant, ask your health care provider about preconception counseling.  If you may  become pregnant, take 400 to 800 micrograms (mcg) of folic acid every day.  If you want to prevent pregnancy, talk to your health care provider about birth control (contraception). OSTEOPOROSIS AND MENOPAUSE   Osteoporosis is a disease in which the bones lose minerals and strength with aging. This can result in serious bone fractures. Your risk for osteoporosis can be identified using a bone density scan.  If you are 61 years of age or older, or if you are at risk for osteoporosis and fractures, ask your health care provider if you should be screened.  Ask your health care provider whether you should take a calcium or vitamin D supplement to lower your risk for osteoporosis.  Menopause may have certain physical symptoms and risks.  Hormone replacement therapy may reduce some of these symptoms and risks. Talk to your health care provider about whether hormone replacement therapy is right for you.  HOME CARE INSTRUCTIONS   Schedule regular health, dental, and eye exams.  Stay current with your immunizations.   Do not use any tobacco products including cigarettes, chewing tobacco, or electronic cigarettes.  If you are pregnant, do not drink alcohol.  If you are breastfeeding, limit how much and how often you drink alcohol.  Limit alcohol intake to no more than 1 drink per day for nonpregnant women. One drink equals 12 ounces of beer, 5 ounces of wine, or 1 ounces of hard liquor.  Do not use street drugs.  Do not share needles.  Ask your health care provider for help if  you need support or information about quitting drugs.  Tell your health care provider if you often feel depressed.  Tell your health care provider if you have ever been abused or do not feel safe at home.   This information is not intended to replace advice given to you by your health care provider. Make sure you discuss any questions you have with your health care provider.   Document Released: 02/05/2011 Document Revised: 08/13/2014 Document Reviewed: 06/24/2013 Elsevier Interactive Patient Education [employer].

## 2016-04-12 LAB — PAP IG W/ RFLX HPV ASCU

## 2016-05-01 MED FILL — GENVOYA TABLET: 150-150-200 | 30 days supply | Qty: 30 | Fill #5

## 2016-05-02 ENCOUNTER — Ambulatory Visit (INDEPENDENT_AMBULATORY_CARE_PROVIDER_SITE_OTHER): Payer: 59 | Admitting: Women's Health

## 2016-05-02 ENCOUNTER — Other Ambulatory Visit: Payer: Self-pay | Admitting: Internal Medicine

## 2016-05-02 ENCOUNTER — Encounter: Payer: Self-pay | Admitting: Women's Health

## 2016-05-02 ENCOUNTER — Ambulatory Visit (INDEPENDENT_AMBULATORY_CARE_PROVIDER_SITE_OTHER): Payer: 59

## 2016-05-02 ENCOUNTER — Other Ambulatory Visit: Payer: Self-pay | Admitting: Women's Health

## 2016-05-02 DIAGNOSIS — R140 Abdominal distension (gaseous): Secondary | ICD-10-CM

## 2016-05-02 DIAGNOSIS — N839 Noninflammatory disorder of ovary, fallopian tube and broad ligament, unspecified: Secondary | ICD-10-CM | POA: Diagnosis not present

## 2016-05-02 DIAGNOSIS — N83202 Unspecified ovarian cyst, left side: Secondary | ICD-10-CM

## 2016-05-02 DIAGNOSIS — I10 Essential (primary) hypertension: Secondary | ICD-10-CM

## 2016-05-02 DIAGNOSIS — B20 Human immunodeficiency virus [HIV] disease: Secondary | ICD-10-CM

## 2016-05-02 DIAGNOSIS — N838 Other noninflammatory disorders of ovary, fallopian tube and broad ligament: Secondary | ICD-10-CM

## 2016-05-02 MED ORDER — ELVITEG-COBIC-EMTRICIT-TENOFAF 150-150-200-10 MG PO TABS: 1.0000 | ORAL_TABLET | Freq: Every day | ORAL | 3 refills | Status: DC

## 2016-05-02 MED FILL — HYDROCHLOROTHIAZIDE 12.5 MG: 12.5 | 90 days supply | Qty: 90 | Fill #0

## 2016-05-02 NOTE — Patient Instructions (Signed)
Ovarian Cyst An ovarian cyst is a fluid-filled sac that forms on an ovary. The ovaries are small organs that produce eggs in women. Various types of cysts can form on the ovaries. Most are not cancerous. Many do not cause problems, and they often go away on their own. Some may cause symptoms and require treatment. Common types of ovarian cysts include:  Functional cysts--These cysts may occur every month during the menstrual cycle. This is normal. The cysts usually go away with the next menstrual cycle if the woman does not get pregnant. Usually, there are no symptoms with a functional cyst.  Endometrioma cysts--These cysts form from the tissue that lines the uterus. They are also called "chocolate cysts" because they become filled with blood that turns brown. This type of cyst can cause pain in the lower abdomen during intercourse and with your menstrual period.  Cystadenoma cysts--This type develops from the cells on the outside of the ovary. These cysts can get very big and cause lower abdomen pain and pain with intercourse. This type of cyst can twist on itself, cut off its blood supply, and cause severe pain. It can also easily rupture and cause a lot of pain.  Dermoid cysts--This type of cyst is sometimes found in both ovaries. These cysts may contain different kinds of body tissue, such as skin, teeth, hair, or cartilage. They usually do not cause symptoms unless they get very big.  Theca lutein cysts--These cysts occur when too much of a certain hormone (human chorionic gonadotropin) is produced and overstimulates the ovaries to produce an egg. This is most common after procedures used to assist with the conception of a baby (in vitro fertilization). CAUSES   Fertility drugs can cause a condition in which multiple large cysts are formed on the ovaries. This is called ovarian hyperstimulation syndrome.  A condition called polycystic ovary syndrome can cause hormonal imbalances that can lead to  nonfunctional ovarian cysts. SIGNS AND SYMPTOMS  Many ovarian cysts do not cause symptoms. If symptoms are present, they may include:  Pelvic pain or pressure.  Pain in the lower abdomen.  Pain during sexual intercourse.  Increasing girth (swelling) of the abdomen.  Abnormal menstrual periods.  Increasing pain with menstrual periods.  Stopping having menstrual periods without being pregnant. DIAGNOSIS  These cysts are commonly found during a routine or annual pelvic exam. Tests may be ordered to find out more about the cyst. These tests may include:  Ultrasound.  X-ray of the pelvis.  CT scan.  MRI.  Blood tests. TREATMENT  Many ovarian cysts go away on their own without treatment. Your health care provider may want to check your cyst regularly for 2-3 months to see if it changes. For women in menopause, it is particularly important to monitor a cyst closely because of the higher rate of ovarian cancer in menopausal women. When treatment is needed, it may include any of the following:  A procedure to drain the cyst (aspiration). This may be done using a long needle and ultrasound. It can also be done through a laparoscopic procedure. This involves using a thin, lighted tube with a tiny camera on the end (laparoscope) inserted through a small incision.  Surgery to remove the whole cyst. This may be done using laparoscopic surgery or an open surgery involving a larger incision in the lower abdomen.  Hormone treatment or birth control pills. These methods are sometimes used to help dissolve a cyst. HOME CARE INSTRUCTIONS   Only take over-the-counter   or prescription medicines as directed by your health care provider.  Follow up with your health care provider as directed.  Get regular pelvic exams and Pap tests. SEEK MEDICAL CARE IF:   Your periods are late, irregular, or painful, or they stop.  Your pelvic pain or abdominal pain does not go away.  Your abdomen becomes  larger or swollen.  You have pressure on your bladder or trouble emptying your bladder completely.  You have pain during sexual intercourse.  You have feelings of fullness, pressure, or discomfort in your stomach.  You lose weight for no apparent reason.  You feel generally ill.  You become constipated.  You lose your appetite.  You develop acne.  You have an increase in body and facial hair.  You are gaining weight, without changing your exercise and eating habits.  You think you are pregnant. SEEK IMMEDIATE MEDICAL CARE IF:   You have increasing abdominal pain.  You feel sick to your stomach (nauseous), and you throw up (vomit).  You develop a fever that comes on suddenly.  You have abdominal pain during a bowel movement.  Your menstrual periods become heavier than usual. MAKE SURE YOU:  Understand these instructions.  Will watch your condition.  Will get help right away if you are not doing well or get worse.   This information is not intended to replace advice given to you by your health care provider. Make sure you discuss any questions you have with your health care provider.   Document Released: 07/23/2005 Document Revised: 07/28/2013 Document Reviewed: 03/30/2013 Elsevier Interactive Patient Education 2016 Else[employer] Inc[employer]  

## 2016-05-02 NOTE — Progress Notes (Signed)
Presents for ultrasound. At annual exam reported left lower quadrant pressure/pain with occasional bloating sensation for the past 2-3 months with increasing discomfort. Monthly cycle/condoms.   Exam: Appears well. Ultrasound: T/V and T/A images anteverted uterus homogeneous. Thin endometrium. Right ovary follicles 14 x 10 mm. Left ovary not seen transvaginally, left ovarian rim of ovarian tissue seen with thin-walled primarily echo-free cyst 48 x 46 x 41 mm , 45 mm mean with thin  Septum, negative CFD. Positive arterial blood flow seen to left ovary. Negative cul-de-sac.  Left ovarian cyst 45 mm mean  Plan:  CA 125, repeat ultrasound in 2 months, reviewed most likely physiologic.

## 2016-05-03 LAB — CA 125: CA 125: 18 U/mL (ref <35)

## 2016-05-29 MED FILL — GENVOYA TABLET: 150-150-200 | 90 days supply | Qty: 90 | Fill #0

## 2016-06-06 ENCOUNTER — Other Ambulatory Visit: Payer: 59

## 2016-06-06 NOTE — Addendum Note (Signed)
Addended by: Lorne Skeens D on: 06/06/2016 08:46 AM   Modules accepted: Orders

## 2016-06-20 ENCOUNTER — Ambulatory Visit (INDEPENDENT_AMBULATORY_CARE_PROVIDER_SITE_OTHER): Payer: 59 | Admitting: Women's Health

## 2016-06-20 ENCOUNTER — Encounter: Payer: Self-pay | Admitting: Women's Health

## 2016-06-20 ENCOUNTER — Ambulatory Visit (INDEPENDENT_AMBULATORY_CARE_PROVIDER_SITE_OTHER): Payer: 59

## 2016-06-20 ENCOUNTER — Ambulatory Visit: Payer: 59 | Admitting: Internal Medicine

## 2016-06-20 VITALS — BP 110/78 | Ht 65.0 in | Wt 123.0 lb

## 2016-06-20 DIAGNOSIS — N83202 Unspecified ovarian cyst, left side: Secondary | ICD-10-CM | POA: Diagnosis not present

## 2016-06-20 NOTE — Progress Notes (Signed)
Presents for follow-up ultrasound. Ultrasound 05/02/2016 showed a thin-walled left ovarian echo-free cyst 48 x 46 x 41 mm, 45 mm mean  with a thin septum CA  125  18. Having some bloating. Monthly cycle/condoms. HIV positive, low CD4 count and 0 viral load.  Exam: Appears well. Ultrasound: T/V anteverted uterus with homogeneous echo. Right ovary  thick-walled corpus luteal 20 x 22 mm follicle, positive CFD periphery. Left ovary normal only seen transabdominally . Negative cul-de-sac. Previous left ovarian cyst resolved.  Normal GYN ultrasound/left ovarian cyst resolved  Plan: Reviewed normality of ultrasound, resolved ovarian cyst.

## 2016-06-20 NOTE — Patient Instructions (Signed)
Ovarian Cyst  An ovarian cyst is a fluid-filled sac that forms on an ovary. The ovaries are small organs that produce eggs in women. Various types of cysts can form on the ovaries. Some may cause symptoms and require treatment. Most ovarian cysts go away on their own, are not cancerous (are benign), and do not cause problems. Common types of ovarian cysts include:  Functional (follicle) cysts.  Occur during the menstrual cycle, and usually go away with the next menstrual cycle if you do not get pregnant.  Usually cause no symptoms.  Endometriomas.  Are cysts that form from the tissue that lines the uterus (endometrium).  Are sometimes called "chocolate cysts" because they become filled with blood that turns brown.  Can cause pain in the lower abdomen during intercourse and during your period.  Cystadenoma cysts.  Develop from cells on the outside surface of the ovary.  Can get very large and cause lower abdomen pain and pain with intercourse.  Can cause severe pain if they twist or break open (rupture).  Dermoid cysts.  Are sometimes found in both ovaries.  May contain different kinds of body tissue, such as skin, teeth, hair, or cartilage.  Usually do not cause symptoms unless they get very big.  Theca lutein cysts.  Occur when too much of a certain hormone (human chorionic gonadotropin) is produced and overstimulates the ovaries to produce an egg.  Are most common after having procedures used to assist with the conception of a baby (in vitro fertilization). What are the causes? Ovarian cysts may be caused by:  Ovarian hyperstimulation syndrome. This is a condition that can develop from taking fertility medicines. It causes multiple large ovarian cysts to form.  Polycystic ovarian syndrome (PCOS). This is a common hormonal disorder that can cause ovarian cysts, as well as problems with your period or fertility. What increases the risk? The following factors may make you  more likely to develop ovarian cysts:  Being overweight or obese.  Taking fertility medicines.  Taking certain forms of hormonal birth control.  Smoking. What are the signs or symptoms? Many ovarian cysts do not cause symptoms. If symptoms are present, they may include:  Pelvic pain or pressure.  Pain in the lower abdomen.  Pain during sex.  Abdominal swelling.  Abnormal menstrual periods.  Increasing pain with menstrual periods. How is this diagnosed? These cysts are commonly found during a routine pelvic exam. You may have tests to find out more about the cyst, such as:  Ultrasound.  X-ray of the pelvis.  CT scan.  MRI.  Blood tests. How is this treated? Many ovarian cysts go away on their own without treatment. Your health care provider may want to check your cyst regularly for 2-3 months to see if it changes. If you are in menopause, it is especially important to have your cyst monitored closely because menopausal women have a higher rate of ovarian cancer. When treatment is needed, it may include:  Medicines to help relieve pain.  A procedure to drain the cyst (aspiration).  Surgery to remove the whole cyst.  Hormone treatment or birth control pills. These methods are sometimes used to help dissolve a cyst. Follow these instructions at home:  Take over-the-counter and prescription medicines only as told by your health care provider.  Do not drive or use heavy machinery while taking prescription pain medicine.  Get regular pelvic exams and Pap tests as often as told by your health care provider.  Return to your   normal activities as told by your health care provider. Ask your health care provider what activities are safe for you.  Do not use any products that contain nicotine or tobacco, such as cigarettes and e-cigarettes. If you need help quitting, ask your health care provider.  Keep all follow-up visits as told by your health care provider. This is  important. Contact a health care provider if:  Your periods are late, irregular, or painful, or they stop.  You have pelvic pain that does not go away.  You have pressure on your bladder or trouble emptying your bladder completely.  You have pain during sex.  You have any of the following in your abdomen:  A feeling of fullness.  Pressure.  Discomfort.  Pain that does not go away.  Swelling.  You feel generally ill.  You become constipated.  You lose your appetite.  You develop severe acne.  You start to have more body hair and facial hair.  You are gaining weight or losing weight without changing your exercise and eating habits.  You think you may be pregnant. Get help right away if:  You have abdominal pain that is severe or gets worse.  You cannot eat or drink without vomiting.  You suddenly develop a fever.  Your menstrual period is much heavier than usual. This information is not intended to replace advice given to you by your health care provider. Make sure you discuss any questions you have with your health care provider. Document Released: 07/23/2005 Document Revised: 02/10/2016 Document Reviewed: 12/25/2015 Elsevier Interactive Patient Education  2017 [employer]  

## 2016-06-27 ENCOUNTER — Ambulatory Visit: Payer: 59 | Admitting: Women's Health

## 2016-06-27 ENCOUNTER — Other Ambulatory Visit: Payer: 59

## 2016-07-04 ENCOUNTER — Other Ambulatory Visit: Payer: 59

## 2016-07-04 DIAGNOSIS — Z79899 Other long term (current) drug therapy: Secondary | ICD-10-CM | POA: Diagnosis not present

## 2016-07-04 DIAGNOSIS — B20 Human immunodeficiency virus [HIV] disease: Secondary | ICD-10-CM | POA: Diagnosis not present

## 2016-07-04 DIAGNOSIS — Z113 Encounter for screening for infections with a predominantly sexual mode of transmission: Secondary | ICD-10-CM | POA: Diagnosis not present

## 2016-07-04 LAB — CBC
HCT: 34.5 % — ABNORMAL LOW (ref 35.0–45.0)
Hemoglobin: 11.7 g/dL (ref 11.7–15.5)
MCH: 32.7 pg (ref 27.0–33.0)
MCHC: 33.9 g/dL (ref 32.0–36.0)
MCV: 96.4 fL (ref 80.0–100.0)
MPV: 9.3 fL (ref 7.5–12.5)
PLATELETS: 207 10*3/uL (ref 140–400)
RBC: 3.58 MIL/uL — AB (ref 3.80–5.10)
RDW: 13 % (ref 11.0–15.0)
WBC: 4.7 10*3/uL (ref 3.8–10.8)

## 2016-07-04 LAB — COMPLETE METABOLIC PANEL WITH GFR
ALT: 10 U/L (ref 6–29)
AST: 16 U/L (ref 10–35)
Albumin: 4.2 g/dL (ref 3.6–5.1)
Alkaline Phosphatase: 27 U/L — ABNORMAL LOW (ref 33–115)
BILIRUBIN TOTAL: 0.4 mg/dL (ref 0.2–1.2)
BUN: 18 mg/dL (ref 7–25)
CHLORIDE: 105 mmol/L (ref 98–110)
CO2: 24 mmol/L (ref 20–31)
Calcium: 9.6 mg/dL (ref 8.6–10.2)
Creat: 1 mg/dL (ref 0.50–1.10)
GFR, EST AFRICAN AMERICAN: 77 mL/min (ref >=60)
GFR, EST NON AFRICAN AMERICAN: 67 mL/min (ref >=60)
GLUCOSE: 97 mg/dL (ref 65–99)
POTASSIUM: 4.1 mmol/L (ref 3.5–5.3)
SODIUM: 138 mmol/L (ref 135–146)
TOTAL PROTEIN: 6.9 g/dL (ref 6.1–8.1)

## 2016-07-04 LAB — LIPID PANEL
CHOL/HDL RATIO: 3.6 ratio (ref <5.0)
CHOLESTEROL: 206 mg/dL — AB (ref <200)
HDL: 58 mg/dL (ref >50)
LDL Cholesterol: 120 mg/dL — ABNORMAL HIGH (ref <100)
TRIGLYCERIDES: 141 mg/dL (ref <150)
VLDL: 28 mg/dL (ref <30)

## 2016-07-05 LAB — RPR

## 2016-07-05 LAB — T-HELPER CELL (CD4) - (RCID CLINIC ONLY)
CD4 % Helper T Cell: 53 % (ref 33–55)
CD4 T Cell Abs: 780 /uL (ref 400–2700)

## 2016-07-06 LAB — HIV-1 RNA QUANT-NO REFLEX-BLD: HIV 1 RNA Quant: <20 copies/mL (ref <20)

## 2016-07-10 ENCOUNTER — Encounter: Payer: Self-pay | Admitting: Internal Medicine

## 2016-07-11 ENCOUNTER — Telehealth: Payer: 59 | Admitting: Family

## 2016-07-11 ENCOUNTER — Encounter: Payer: Self-pay | Admitting: Family Medicine

## 2016-07-11 ENCOUNTER — Encounter: Payer: Self-pay | Admitting: *Deleted

## 2016-07-11 DIAGNOSIS — B9789 Other viral agents as the cause of diseases classified elsewhere: Secondary | ICD-10-CM | POA: Diagnosis not present

## 2016-07-11 DIAGNOSIS — J069 Acute upper respiratory infection, unspecified: Secondary | ICD-10-CM | POA: Diagnosis not present

## 2016-07-11 MED ORDER — BENZONATATE 100 MG PO CAPS: 100.0000 mg | ORAL_CAPSULE | Freq: Two times a day (BID) | ORAL | 0 refills | Status: DC | PRN

## 2016-07-11 MED ORDER — FLUTICASONE PROPIONATE 50 MCG/ACT NA SUSP: 2.0000 | Freq: Every day | NASAL | 6 refills | Status: DC

## 2016-07-11 MED FILL — BENZONATATE 100 MG CAPSULE: 100 | 10 days supply | Qty: 20 | Fill #0

## 2016-07-11 MED FILL — FLUTICASONE PROP 50 MCG SPR: 50 | 30 days supply | Qty: 16 | Fill #0

## 2016-07-11 NOTE — Progress Notes (Signed)
We are sorry that you are not feeling well.  Here is how we plan to help!  Based on what you have shared with me it looks like you have upper respiratory tract inflammation that has resulted in a significant cough.  Inflammation and infection in the upper respiratory tract is commonly called bronchitis and has four common causes:  Allergies, Viral Infections, Acid Reflux and Bacterial Infections.  Allergies, viruses and acid reflux are treated by controlling symptoms or eliminating the cause. An example might be a cough caused by taking certain blood pressure medications. You stop the cough by changing the medication. Another example might be a cough caused by acid reflux. Controlling the reflux helps control the cough.  Based on your presentation I believe you most likely have A cough due to a virus.  This is called viral bronchitis and is best treated by rest, plenty of fluids and control of the cough.  You may use Ibuprofen or Tylenol as directed to help your symptoms.  I have also sent in a prescription of Flonase nasal spray. Two sprays in each nostril.    In addition you may use A non-prescription cough medication called Robitussin DAC. Take 2 teaspoons every 8 hours or Delsym: take 2 teaspoons every 12 hours., A non-prescription cough medication called Mucinex DM: take 2 tablets every 12 hours. and A prescription cough medication called Tessalon Perles 100mg . You may take 1-2 capsules every 8 hours as needed for your cough.    USE OF BRONCHODILATOR ("RESCUE") INHALERS: There is a risk from using your bronchodilator too frequently.  The risk is that over-reliance on a medication which only relaxes the muscles surrounding the breathing tubes can reduce the effectiveness of medications prescribed to reduce swelling and congestion of the tubes themselves.  Although you feel brief relief from the bronchodilator inhaler, your asthma may actually be worsening with the tubes becoming more swollen and  filled with mucus.  This can delay other crucial treatments, such as oral steroid medications. If you need to use a bronchodilator inhaler daily, several times per day, you should discuss this with your provider.  There are probably better treatments that could be used to keep your asthma under control.     HOME CARE . Only take medications as instructed by your medical team. . Complete the entire course of an antibiotic. . Drink plenty of fluids and get plenty of rest. . Avoid close contacts especially the very young and the elderly . Cover your mouth if you cough or cough into your sleeve. . Always remember to wash your hands . A steam or ultrasonic humidifier can help congestion.   GET HELP RIGHT AWAY IF: . You develop worsening fever. . You become short of breath . You cough up blood. . Your symptoms persist after you have completed your treatment plan MAKE SURE YOU   Understand these instructions.  Will watch your condition.  Will get help right away if you are not doing well or get worse.  Your e-visit answers were reviewed by a board certified advanced clinical practitioner to complete your personal care plan.  Depending on the condition, your plan could have included both over the counter or prescription medications. If there is a problem please reply  once you have received a response from your provider. Your safety is important to [place].  If you have drug allergies check your prescription carefully.    You can use MyChart to ask questions about today's visit, request a non-urgent  call back, or ask for a work or school excuse for 24 hours related to this e-Visit. If it has been greater than 24 hours you will need to follow up with your provider, or enter a new e-Visit to address those concerns. You will get an e-mail in the next two days asking about your experience.  I hope that your e-visit has been valuable and will speed your recovery. Thank you for using e-visits.   

## 2016-07-18 ENCOUNTER — Ambulatory Visit: Payer: 59 | Admitting: Internal Medicine

## 2016-08-07 ENCOUNTER — Ambulatory Visit (INDEPENDENT_AMBULATORY_CARE_PROVIDER_SITE_OTHER): Payer: 59 | Admitting: Internal Medicine

## 2016-08-07 ENCOUNTER — Encounter: Payer: Self-pay | Admitting: Internal Medicine

## 2016-08-07 VITALS — BP 113/76 | HR 78 | Temp 98.3°F | Ht 65.5 in | Wt 125.8 lb

## 2016-08-07 DIAGNOSIS — Z23 Encounter for immunization: Secondary | ICD-10-CM | POA: Diagnosis not present

## 2016-08-07 DIAGNOSIS — Z789 Other specified health status: Secondary | ICD-10-CM | POA: Diagnosis not present

## 2016-08-07 DIAGNOSIS — B20 Human immunodeficiency virus [HIV] disease: Secondary | ICD-10-CM

## 2016-08-07 DIAGNOSIS — R635 Abnormal weight gain: Secondary | ICD-10-CM | POA: Diagnosis not present

## 2016-08-07 MED ORDER — EFAVIRENZ-EMTRICITAB-TENOFOVIR 600-200-300 MG PO TABS: 1.0000 | ORAL_TABLET | Freq: Every day | ORAL | 11 refills | Status: DC

## 2016-08-07 MED FILL — ATRIPLA 600-200-300 MG TABS: 600-200-300 | 30 days supply | Qty: 30 | Fill #0

## 2016-08-07 NOTE — Progress Notes (Signed)
Rfv: follow up for hiv disease  Patient ID: Cynthia [name], female   DOB: 12/19/1965, 51 y.o.   MRN: [id number]  HPI 51yo F with hiv disease, CD 4 count of 780/VL<20 on genvoya. Worried about weight gain at last visit. She is 2 lb more than last visit, nearly 10 lb her baseline weight. Also has associated GERD which is new for her  She states she had 2 month of chronic dry cough now improved  Wonders if she needs tdap since she newborn grandchildren  Outpatient Encounter Prescriptions as of 08/07/2016  Medication Sig  . Cholecalciferol (VITAMIN D) 2000 UNITS tablet Take 2,000 Units by mouth daily.    . cyclobenzaprine (FLEXERIL) 10 MG tablet Take 1 tablet (10 mg total) by mouth every 8 (eight) hours as needed for muscle spasms.  Marland Kitchen elvitegravir-cobicistat-emtricitabine-tenofovir (GENVOYA) 150-150-200-10 MG TABS tablet Take 1 tablet by mouth daily with breakfast.  . fluticasone (FLONASE) 50 MCG/ACT nasal spray Place 2 sprays into both nostrils daily.  Marland Kitchen lisinopril (ZESTRIL) 10 MG tablet Take 1 tablet (10 mg total) by mouth daily.  . benzonatate (TESSALON) 100 MG capsule Take 1 capsule (100 mg total) by mouth 2 (two) times daily as needed for cough. (Patient not taking: Reported on 08/07/2016)  . hydrochlorothiazide (MICROZIDE) 12.5 MG capsule TAKE 1 CAPSULE BY MOUTH ONCE DAILY (Patient not taking: Reported on 08/07/2016)   No facility-administered encounter medications on file as of 08/07/2016.      Patient Active Problem List   Diagnosis Date Noted  . 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 Hypertension   . HIV DISEASE 11/07/2006  . DISEASE, HYPERTENSIVE HEART, BENIGN, W/HF 11/07/2006     Health Maintenance Due  Topic Date Due  . TETANUS/TDAP  11/09/1986  . INFLUENZA VACCINE  03/06/2016     Review of Systems  Constitutional: Negative for fever, chills, diaphoresis, activity change, appetite change, fatigue and unexpected weight change.  HENT: Negative for congestion, sore throat, rhinorrhea,  sneezing, trouble swallowing and sinus pressure.  Eyes: Negative for photophobia and visual disturbance.  Respiratory: Negative for cough, chest tightness, shortness of breath, wheezing and stridor.  Cardiovascular: Negative for chest pain, palpitations and leg swelling.  Gastrointestinal: Negative for nausea, vomiting, abdominal pain, diarrhea, constipation, blood in stool, abdominal distention and anal bleeding.  Genitourinary: Negative for dysuria, hematuria, flank pain and difficulty urinating.  Musculoskeletal: Negative for myalgias, back pain, joint swelling, arthralgias and gait problem.  Skin: Negative for color change, pallor, rash and wound.  Neurological: Negative for dizziness, tremors, weakness and light-headedness.  Hematological: Negative for adenopathy. Does not bruise/bleed easily.  Psychiatric/Behavioral: Negative for behavioral problems, confusion, sleep disturbance, dysphoric mood, decreased concentration and agitation.    Physical Exam   BP 113/76   Pulse 78   Temp 98.3 F (36.8 C) (Oral)   Ht 5' 5.5" (1.664 m)   Wt 125 lb 12.8 oz (57.1 kg)   LMP 07/17/2016 (Approximate)   BMI 20.62 kg/m  Physical Exam  Constitutional:  oriented to person, place, and time. appears well-developed and well-nourished. No distress.  HENT: [place]/AT, PERRLA, no scleral icterus Mouth/Throat: Oropharynx is clear and moist. No oropharyngeal exudate.  Cardiovascular: Normal rate, regular rhythm and normal heart sounds. Exam reveals no gallop and no friction rub.  No murmur heard.  Pulmonary/Chest: Effort normal and breath sounds normal. No respiratory distress.  has no wheezes.  Neck = supple, no nuchal rigidity Abdominal: Soft. Bowel sounds are normal.  exhibits no distension. There is no tenderness.  Lymphadenopathy: no cervical adenopathy. No axillary adenopathy Neurological: alert and oriented to person, place, and time.  Skin: Skin is warm and dry. No rash noted. No erythema.    Psychiatric: a normal mood and affect.  behavior is normal.   Lab Results  Component Value Date   CD4TCELL 53 07/04/2016   Lab Results  Component Value Date   CD4TABS 780 07/04/2016   CD4TABS 640 02/29/2016   CD4TABS 810 04/27/2015   Lab Results  Component Value Date   HIV1RNAQUANT <20 07/04/2016   Lab Results  Component Value Date   HEPBSAB No 09/30/2006   No results found for: RPR  CBC Lab Results  Component Value Date   WBC 4.7 07/04/2016   RBC 3.58 (L) 07/04/2016   HGB 11.7 07/04/2016   HCT 34.5 (L) 07/04/2016   PLT 207 07/04/2016   MCV 96.4 07/04/2016   MCH 32.7 07/04/2016   MCHC 33.9 07/04/2016   RDW 13.0 07/04/2016   LYMPHSABS 1,320 02/29/2016   MONOABS 352 02/29/2016   EOSABS 88 02/29/2016   BASOSABS 44 02/29/2016   BMET Lab Results  Component Value Date   NA 138 07/04/2016   K 4.1 07/04/2016   CL 105 07/04/2016   CO2 24 07/04/2016   GLUCOSE 97 07/04/2016   BUN 18 07/04/2016   CREATININE 1.00 07/04/2016   CALCIUM 9.6 07/04/2016   GFRNONAA 67 07/04/2016   GFRAA 77 07/04/2016     Assessment and Plan  hiv disease = will switch back to atripla, since she did not have associated weight gains with meds. Can consider changing to odesfey as possible change but wonder if the caloric requirement would make her skeptical with weight gain  gerd = can still use prn prilosec  Health maintenance = will give tdap today, meningococcal at next visit

## 2016-08-07 NOTE — Patient Instructions (Signed)
Please come back in 4-6 wk after changing back to your older regimen so that we can check labs to ensure everything is doing well

## 2016-08-09 ENCOUNTER — Ambulatory Visit: Payer: 59 | Admitting: Internal Medicine

## 2016-08-14 MED FILL — LISINOPRIL 10 MG TABLET: 10 | 90 days supply | Qty: 90 | Fill #1

## 2016-09-10 ENCOUNTER — Other Ambulatory Visit: Payer: Self-pay | Admitting: Gynecology

## 2016-09-10 DIAGNOSIS — Z1231 Encounter for screening mammogram for malignant neoplasm of breast: Secondary | ICD-10-CM

## 2016-09-24 MED FILL — ATRIPLA 600-200-300 MG TABS: 600-200-300 | 30 days supply | Qty: 30 | Fill #1

## 2016-09-27 DIAGNOSIS — H33021 Retinal detachment with multiple breaks, right eye: Secondary | ICD-10-CM | POA: Diagnosis not present

## 2016-09-27 DIAGNOSIS — Z961 Presence of intraocular lens: Secondary | ICD-10-CM | POA: Diagnosis not present

## 2016-09-27 DIAGNOSIS — Z21 Asymptomatic human immunodeficiency virus [HIV] infection status: Secondary | ICD-10-CM | POA: Diagnosis not present

## 2016-10-10 ENCOUNTER — Ambulatory Visit
Admission: RE | Admit: 2016-10-10 | Discharge: 2016-10-10 | Disposition: A | Discharge status: Home / Self Care | Payer: 59 | Source: Ambulatory Visit | Attending: Gynecology | Admitting: Gynecology

## 2016-10-10 DIAGNOSIS — Z1231 Encounter for screening mammogram for malignant neoplasm of breast: Secondary | ICD-10-CM | POA: Diagnosis not present

## 2016-10-29 MED FILL — ATRIPLA 600-200-300 MG TABS: 600-200-300 | 30 days supply | Qty: 30 | Fill #2

## 2016-11-26 MED FILL — ATRIPLA 600-200-300 MG TABS: 600-200-300 | 30 days supply | Qty: 30 | Fill #3

## 2016-12-19 ENCOUNTER — Encounter: Payer: Self-pay | Admitting: Gynecology

## 2016-12-20 MED FILL — LISINOPRIL 10 MG TABLET: 10 | 90 days supply | Qty: 90 | Fill #2

## 2016-12-26 MED FILL — ATRIPLA 600-200-300 MG TABS: 600-200-300 | 30 days supply | Qty: 30 | Fill #4

## 2017-01-28 MED FILL — ATRIPLA 600-200-300 MG TABS: 600-200-300 | 30 days supply | Qty: 30 | Fill #5

## 2017-02-25 MED FILL — ATRIPLA 600-200-300 MG TABS: 600-200-300 | 30 days supply | Qty: 30 | Fill #6

## 2017-03-25 MED FILL — ATRIPLA 600-200-300 MG TABS: 600-200-300 | 30 days supply | Qty: 30 | Fill #7

## 2017-04-06 ENCOUNTER — Encounter: Payer: Self-pay | Admitting: Internal Medicine

## 2017-04-06 ENCOUNTER — Encounter: Payer: Self-pay | Admitting: Women's Health

## 2017-04-15 ENCOUNTER — Other Ambulatory Visit: Payer: Self-pay | Admitting: Internal Medicine

## 2017-04-15 ENCOUNTER — Encounter: Payer: Self-pay | Admitting: Internal Medicine

## 2017-04-15 DIAGNOSIS — I10 Essential (primary) hypertension: Secondary | ICD-10-CM

## 2017-04-15 MED FILL — LISINOPRIL 10 MG TABS: 10 | 90 days supply | Qty: 90 | Fill #0

## 2017-04-16 ENCOUNTER — Other Ambulatory Visit: Payer: Self-pay | Admitting: Certified Nurse Midwife

## 2017-04-16 DIAGNOSIS — G43111 Migraine with aura, intractable, with status migrainosus: Secondary | ICD-10-CM

## 2017-04-16 MED ORDER — BUTALBITAL-APAP-CAFFEINE 50-325-40 MG PO TABS: 1.0000 | ORAL_TABLET | Freq: Four times a day (QID) | ORAL | 4 refills | Status: DC | PRN

## 2017-04-16 MED FILL — BUTALB-ACETAMIN-CAFF 50-325: 50-325-40 | 5 days supply | Qty: 45 | Fill #0

## 2017-04-16 MED FILL — ATRIPLA 600-200-300 MG TABS: 600-200-300 | 30 days supply | Qty: 30 | Fill #8

## 2017-05-01 ENCOUNTER — Other Ambulatory Visit: Payer: 59

## 2017-05-03 ENCOUNTER — Encounter: Payer: Self-pay | Admitting: Internal Medicine

## 2017-05-06 ENCOUNTER — Other Ambulatory Visit: Payer: 59

## 2017-05-06 DIAGNOSIS — B20 Human immunodeficiency virus [HIV] disease: Secondary | ICD-10-CM | POA: Diagnosis not present

## 2017-05-06 LAB — COMPLETE METABOLIC PANEL WITH GFR
AG RATIO: 1.5 (calc) (ref 1.0–2.5)
ALKALINE PHOSPHATASE (APISO): 62 U/L (ref 33–115)
ALT: 13 U/L (ref 6–29)
AST: 18 U/L (ref 10–35)
Albumin: 4.3 g/dL (ref 3.6–5.1)
BILIRUBIN TOTAL: 0.3 mg/dL (ref 0.2–1.2)
BUN: 19 mg/dL (ref 7–25)
CO2: 26 mmol/L (ref 20–32)
Calcium: 9.4 mg/dL (ref 8.6–10.2)
Chloride: 106 mmol/L (ref 98–110)
Creat: 1.08 mg/dL (ref 0.50–1.10)
GFR, EST NON AFRICAN AMERICAN: 60 mL/min/{1.73_m2} (ref >=60)
GFR, Est African American: 70 mL/min/{1.73_m2} (ref >=60)
GLOBULIN: 2.8 g/dL (ref 1.9–3.7)
Glucose, Bld: 90 mg/dL (ref 65–99)
POTASSIUM: 4.4 mmol/L (ref 3.5–5.3)
SODIUM: 137 mmol/L (ref 135–146)
Total Protein: 7.1 g/dL (ref 6.1–8.1)

## 2017-05-06 LAB — CBC WITH DIFFERENTIAL/PLATELET
BASOS ABS: 32 {cells}/uL (ref 0–200)
Basophils Relative: 0.7 %
Eosinophils Absolute: 133 cells/uL (ref 15–500)
Eosinophils Relative: 2.9 %
HEMATOCRIT: 34.9 % — AB (ref 35.0–45.0)
Hemoglobin: 11.6 g/dL — ABNORMAL LOW (ref 11.7–15.5)
LYMPHS ABS: 1642 {cells}/uL (ref 850–3900)
MCH: 29.9 pg (ref 27.0–33.0)
MCHC: 33.2 g/dL (ref 32.0–36.0)
MCV: 89.9 fL (ref 80.0–100.0)
MPV: 9.8 fL (ref 7.5–12.5)
Monocytes Relative: 8.3 %
NEUTROS ABS: 2410 {cells}/uL (ref 1500–7800)
NEUTROS PCT: 52.4 %
Platelets: 224 10*3/uL (ref 140–400)
RBC: 3.88 10*6/uL (ref 3.80–5.10)
RDW: 12.1 % (ref 11.0–15.0)
Total Lymphocyte: 35.7 %
WBC: 4.6 10*3/uL (ref 3.8–10.8)
WBCMIX: 382 {cells}/uL (ref 200–950)

## 2017-05-07 LAB — T-HELPER CELL (CD4) - (RCID CLINIC ONLY)
CD4 % Helper T Cell: 45 % (ref 33–55)
CD4 T Cell Abs: 720 /uL (ref 400–2700)

## 2017-05-08 LAB — HIV-1 RNA QUANT-NO REFLEX-BLD
HIV 1 RNA Quant: <20 copies/mL
HIV-1 RNA Quant, Log: <1.3 Log copies/mL

## 2017-05-14 DIAGNOSIS — L821 Other seborrheic keratosis: Secondary | ICD-10-CM | POA: Diagnosis not present

## 2017-05-14 DIAGNOSIS — D1800 Hemangioma unspecified site: Secondary | ICD-10-CM | POA: Diagnosis not present

## 2017-05-14 DIAGNOSIS — Z23 Encounter for immunization: Secondary | ICD-10-CM | POA: Diagnosis not present

## 2017-05-14 DIAGNOSIS — D225 Melanocytic nevi of trunk: Secondary | ICD-10-CM | POA: Diagnosis not present

## 2017-05-14 DIAGNOSIS — L814 Other melanin hyperpigmentation: Secondary | ICD-10-CM | POA: Diagnosis not present

## 2017-05-15 ENCOUNTER — Ambulatory Visit: Payer: 59 | Admitting: Internal Medicine

## 2017-05-16 ENCOUNTER — Encounter: Payer: Self-pay | Admitting: Internal Medicine

## 2017-05-16 ENCOUNTER — Ambulatory Visit (INDEPENDENT_AMBULATORY_CARE_PROVIDER_SITE_OTHER): Payer: 59 | Admitting: Internal Medicine

## 2017-05-16 VITALS — BP 115/79 | HR 85 | Temp 97.8°F | Wt 131.0 lb

## 2017-05-16 DIAGNOSIS — R635 Abnormal weight gain: Secondary | ICD-10-CM | POA: Diagnosis not present

## 2017-05-16 DIAGNOSIS — Z23 Encounter for immunization: Secondary | ICD-10-CM

## 2017-05-16 DIAGNOSIS — B20 Human immunodeficiency virus [HIV] disease: Secondary | ICD-10-CM | POA: Diagnosis not present

## 2017-05-16 MED ORDER — EFAVIRENZ-EMTRICITAB-TENOFOVIR 600-200-300 MG PO TABS: 1.0000 | ORAL_TABLET | Freq: Every day | ORAL | 11 refills | Status: DC

## 2017-05-16 NOTE — Progress Notes (Signed)
RFV: follow up for hiv disease  Patient ID: Cynthia [name], female   DOB: 09-04-1965, 51 y.o.   MRN: [id number]  HPI Cynthia [name] is a 51yo F with hiv disease, well controlled with CD 4 count of 720/VL<20 on atripla. We briefly switched her to a TAF regimen but she thought it was associated with #15 lb weight gain. Since being back on atripla, not necessarily having weight loss. She is otherwise in good health. Is perimenopausal, has 2 menses in 6 months, with heavy bleeding of 10d then long tail of spotting.  Outpatient Encounter Prescriptions as of 05/16/2017  Medication Sig  . butalbital-acetaminophen-caffeine (FIORICET, ESGIC) 50-325-40 MG tablet Take 1-2 tablets by mouth every 6 (six) hours as needed.  . Cholecalciferol (VITAMIN D) 2000 UNITS tablet Take 2,000 Units by mouth daily.    . cyclobenzaprine (FLEXERIL) 10 MG tablet Take 1 tablet (10 mg total) by mouth every 8 (eight) hours as needed for muscle spasms.  Marland Kitchen efavirenz-emtricitabine-tenofovir (ATRIPLA) 600-200-300 MG tablet Take 1 tablet by mouth at bedtime.  Marland Kitchen lisinopril (PRINIVIL,ZESTRIL) 10 MG tablet TAKE 1 TABLET BY MOUTH ONCE DAILY  . fluticasone (FLONASE) 50 MCG/ACT nasal spray Place 2 sprays into both nostrils daily. (Patient not taking: Reported on 05/16/2017)   No facility-administered encounter medications on file as of 05/16/2017.      Patient Active Problem List   Diagnosis Date Noted  . 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 Hypertension   . HIV DISEASE 11/07/2006  . DISEASE, HYPERTENSIVE HEART, BENIGN, W/HF 11/07/2006     Health Maintenance Due  Topic Date Due  . INFLUENZA VACCINE  03/06/2017  . PAP SMEAR  04/11/2017     Review of Systems  Constitutional: Negative for fever, chills, diaphoresis, activity change, appetite change, fatigue and unexpected weight change.  HENT: Negative for congestion, sore throat, rhinorrhea, sneezing, trouble swallowing and sinus pressure.  Eyes: Negative for photophobia and visual disturbance.   Respiratory: Negative for cough, chest tightness, shortness of breath, wheezing and stridor.  Cardiovascular: Negative for chest pain, palpitations and leg swelling.  Gastrointestinal: Negative for nausea, vomiting, abdominal pain, diarrhea, constipation, blood in stool, abdominal distention and anal bleeding.  Genitourinary: Negative for dysuria, hematuria, flank pain and difficulty urinating.  Musculoskeletal: Negative for myalgias, back pain, joint swelling, arthralgias and gait problem.  Skin: Negative for color change, pallor, rash and wound.  Neurological: Negative for dizziness, tremors, weakness and light-headedness.  Hematological: Negative for adenopathy. Does not bruise/bleed easily.  Psychiatric/Behavioral: Negative for behavioral problems, confusion, sleep disturbance, dysphoric mood, decreased concentration and agitation.    Physical Exam   BP 115/79   Pulse 85   Temp 97.8 F (36.6 C) (Oral)   Wt 131 lb (59.4 kg)   LMP 05/02/2017 (Approximate)   BMI 21.47 kg/m   Physical Exam  Constitutional:  oriented to person, place, and time. appears well-developed and well-nourished. No distress.  HENT: [place]/AT, PERRLA, no scleral icterus Mouth/Throat: Oropharynx is clear and moist. No oropharyngeal exudate.  Cardiovascular: Normal rate, regular rhythm and normal heart sounds. Exam reveals no gallop and no friction rub.  No murmur heard.  Pulmonary/Chest: Effort normal and breath sounds normal. No respiratory distress.  has no wheezes.  Neck = supple, no nuchal rigidity Lymphadenopathy: no cervical adenopathy. No axillary adenopathy Neurological: alert and oriented to person, place, and time.  Skin: Skin is warm and dry. No rash noted. No erythema.  Psychiatric: a normal mood and affect.  behavior is normal.   Lab Results  Component Value Date   CD4TCELL 45 05/06/2017   Lab Results  Component Value Date   CD4TABS 720 05/06/2017   CD4TABS 780 07/04/2016   CD4TABS 640  02/29/2016   Lab Results  Component Value Date   HIV1RNAQUANT <20 NOT DETECTED 05/06/2017   Lab Results  Component Value Date   HEPBSAB No 09/30/2006   Lab Results  Component Value Date   LABRPR NON REAC 07/04/2016    CBC Lab Results  Component Value Date   WBC 4.6 05/06/2017   RBC 3.88 05/06/2017   HGB 11.6 (L) 05/06/2017   HCT 34.9 (L) 05/06/2017   PLT 224 05/06/2017   MCV 89.9 05/06/2017   MCH 29.9 05/06/2017   MCHC 33.2 05/06/2017   RDW 12.1 05/06/2017   LYMPHSABS 1,642 05/06/2017   MONOABS 352 02/29/2016   EOSABS 133 05/06/2017    BMET Lab Results  Component Value Date   NA 137 05/06/2017   K 4.4 05/06/2017   CL 106 05/06/2017   CO2 26 05/06/2017   GLUCOSE 90 05/06/2017   BUN 19 05/06/2017   CREATININE 1.08 05/06/2017   CALCIUM 9.4 05/06/2017   GFRNONAA 60 05/06/2017   GFRAA 70 05/06/2017      Assessment and Plan   hiv disease = well controlled, will refill meds.  Health maintenance = will give meningococcal vaccine today. She received flu vaccine at work mid sep  Weight gain = likely multifactorial, job stretch, change in metabolism. No difference switching back to [place]. Will continue to monitor  rtc in 6 months, labs 2 wk prior

## 2017-05-16 NOTE — Patient Instructions (Signed)
Come on back 2 wk before your visit to get lab work

## 2017-05-22 ENCOUNTER — Encounter: Payer: Self-pay | Admitting: Women's Health

## 2017-05-22 ENCOUNTER — Ambulatory Visit (INDEPENDENT_AMBULATORY_CARE_PROVIDER_SITE_OTHER): Payer: 59 | Admitting: Women's Health

## 2017-05-22 VITALS — BP 110/80 | Ht 65.0 in | Wt 132.0 lb

## 2017-05-22 DIAGNOSIS — Z01419 Encounter for gynecological examination (general) (routine) without abnormal findings: Secondary | ICD-10-CM

## 2017-05-22 NOTE — Patient Instructions (Signed)

## 2017-05-22 NOTE — Progress Notes (Signed)
Cynthia [name] [birth year] [id number]    History:    Presents for annual exam.  Monthly cycle/condoms. Normal Pap and mammogram history. Primary care manages labs for hypertension. Positive HIV 1990 viral load 0.  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 One daughter, 2 grandchildren. Has 2 horses.  ROS:  A ROS was performed and pertinent positives and negatives are included.  Exam:  Vitals:   05/22/17 0959  BP: 110/80  Weight: 132 lb (59.9 kg)  Height: 5\' 5"  (1.651 m)   Body mass index is 21.97 kg/m.   General appearance:  Normal Thyroid:  Symmetrical, normal in size, without palpable masses or nodularity. Respiratory  Auscultation:  Clear without wheezing or rhonchi Cardiovascular  Auscultation:  Regular rate, without rubs, murmurs or gallops  Edema/varicosities:  Not grossly evident Abdominal  Soft,nontender, without masses, guarding or rebound.  Liver/spleen:  No organomegaly noted  Hernia:  None appreciated  Skin  Inspection:  Grossly normal   Breasts: Examined lying and sitting.     Right: Without masses, retractions, discharge or axillary adenopathy.     Left: Without masses, retractions, discharge or axillary adenopathy. Gentitourinary   Inguinal/mons:  Normal without inguinal adenopathy  External genitalia:  Normal  BUS/Urethra/Skene's glands:  Normal  Vagina:  Normal  Cervix:  Normal  Uterus:   normal in size, shape and contour.  Midline and mobile  Adnexa/parametria:     Rt: Without masses or tenderness.   Lt: Without masses or tenderness.  Anus and perineum: Normal  Digital rectal exam: Normal sphincter tone without palpated masses or tenderness  Assessment/Plan:  51 y.o. MWF G2 4 P1 for annual exam with no complaints.  Monthly cycle/condoms Hypertension - primary care manages labs and meds 1990 HIV positive, compliant with  care  Plan: SBE's, annual screeningamin D 2000 daily, xercise, calcium rich diet encouraged pap with HR HPV typing, annual Paps.   [place], 1:12 PM 05/22/2017

## 2017-05-23 LAB — PAP, TP IMAGING W/ HPV RNA, RFLX HPV TYPE 16,18/45: HPV DNA High Risk: NOT DETECTED

## 2017-05-23 LAB — URINALYSIS W MICROSCOPIC + REFLEX CULTURE
BACTERIA UA: NONE SEEN /HPF
Bilirubin Urine: NEGATIVE
Glucose, UA: NEGATIVE
Hyaline Cast: NONE SEEN /LPF
KETONES UR: NEGATIVE
LEUKOCYTE ESTERASE: NEGATIVE
Nitrites, Initial: NEGATIVE
PROTEIN: NEGATIVE
SPECIFIC GRAVITY, URINE: 1.018 (ref 1.001–1.03)
WBC, UA: NONE SEEN /HPF (ref 0–5)
pH: 5.5 (ref 5.0–8.0)

## 2017-05-23 LAB — NO CULTURE INDICATED

## 2017-05-29 MED FILL — ATRIPLA 600-200-300 MG TABS: 600-200-300 | 30 days supply | Qty: 30 | Fill #9

## 2017-06-25 MED FILL — ATRIPLA 600-200-300 MG TABS: 600-200-300 | 30 days supply | Qty: 30 | Fill #10

## 2017-07-09 ENCOUNTER — Other Ambulatory Visit: Payer: Self-pay | Admitting: Pharmacist

## 2017-07-24 ENCOUNTER — Other Ambulatory Visit: Payer: Self-pay | Admitting: Internal Medicine

## 2017-07-24 DIAGNOSIS — I10 Essential (primary) hypertension: Secondary | ICD-10-CM

## 2017-07-24 MED FILL — ATRIPLA 600-200-300 MG TABS: 600-200-300 | 30 days supply | Qty: 30 | Fill #11

## 2017-07-24 MED FILL — LISINOPRIL 10 MG TABS: 10 | 90 days supply | Qty: 90 | Fill #0

## 2017-08-23 MED FILL — ATRIPLA 600-200-300 MG TABS: 600-200-300 | 30 days supply | Qty: 30 | Fill #0

## 2017-09-24 MED FILL — BUTALB-ACETAMIN-CAFF 50-325: 50-325-40 | 5 days supply | Qty: 45 | Fill #1

## 2017-09-27 MED FILL — ATRIPLA 600-200-300 MG TABS: 600-200-300 | 30 days supply | Qty: 30 | Fill #1

## 2017-10-04 ENCOUNTER — Other Ambulatory Visit: Payer: Self-pay | Admitting: Pharmacist Clinician (PhC)/ Clinical Pharmacy Specialist

## 2017-10-04 DIAGNOSIS — B20 Human immunodeficiency virus [HIV] disease: Secondary | ICD-10-CM

## 2017-10-23 ENCOUNTER — Other Ambulatory Visit: Payer: Self-pay | Admitting: Gynecology

## 2017-10-23 DIAGNOSIS — Z1231 Encounter for screening mammogram for malignant neoplasm of breast: Secondary | ICD-10-CM

## 2017-10-25 MED FILL — ATRIPLA 600-200-300 MG TABS: 600-200-300 | 30 days supply | Qty: 30 | Fill #2

## 2017-11-06 ENCOUNTER — Other Ambulatory Visit: Payer: 59

## 2017-11-06 DIAGNOSIS — B20 Human immunodeficiency virus [HIV] disease: Secondary | ICD-10-CM

## 2017-11-07 LAB — LIPID PANEL
Cholesterol: 210 mg/dL — ABNORMAL HIGH (ref <200)
HDL: 45 mg/dL — ABNORMAL LOW (ref >50)
LDL CHOLESTEROL (CALC): 137 mg/dL — AB
NON-HDL CHOLESTEROL (CALC): 165 mg/dL — AB (ref <130)
TRIGLYCERIDES: 146 mg/dL (ref <150)
Total CHOL/HDL Ratio: 4.7 (calc) (ref <5.0)

## 2017-11-07 LAB — BASIC METABOLIC PANEL
BUN / CREAT RATIO: 19 (calc) (ref 6–22)
BUN: 21 mg/dL (ref 7–25)
CO2: 24 mmol/L (ref 20–32)
Calcium: 9.2 mg/dL (ref 8.6–10.2)
Chloride: 109 mmol/L (ref 98–110)
Creat: 1.12 mg/dL — ABNORMAL HIGH (ref 0.50–1.10)
Glucose, Bld: 116 mg/dL — ABNORMAL HIGH (ref 65–99)
Potassium: 4.4 mmol/L (ref 3.5–5.3)
SODIUM: 139 mmol/L (ref 135–146)

## 2017-11-07 LAB — RPR: RPR Ser Ql: NONREACTIVE

## 2017-11-07 LAB — URINE CYTOLOGY ANCILLARY ONLY
Chlamydia: NEGATIVE
Neisseria Gonorrhea: NEGATIVE

## 2017-11-08 LAB — HIV-1 RNA QUANT-NO REFLEX-BLD
HIV 1 RNA QUANT: NOT DETECTED {copies}/mL
HIV-1 RNA QUANT, LOG: NOT DETECTED {Log_copies}/mL

## 2017-11-20 ENCOUNTER — Ambulatory Visit
Admission: RE | Admit: 2017-11-20 | Discharge: 2017-11-20 | Disposition: A | Discharge status: Home / Self Care | Payer: 59 | Source: Ambulatory Visit | Attending: Gynecology | Admitting: Gynecology

## 2017-11-20 DIAGNOSIS — Z1231 Encounter for screening mammogram for malignant neoplasm of breast: Secondary | ICD-10-CM | POA: Diagnosis not present

## 2017-11-27 MED FILL — LISINOPRIL 10 MG TABS: 10 | 90 days supply | Qty: 90 | Fill #1

## 2017-11-27 MED FILL — ATRIPLA 600-200-300 MG TABS: 600-200-300 | 30 days supply | Qty: 30 | Fill #3

## 2017-12-04 ENCOUNTER — Encounter: Payer: Self-pay | Admitting: Internal Medicine

## 2017-12-04 ENCOUNTER — Ambulatory Visit: Payer: 59 | Admitting: Internal Medicine

## 2017-12-04 VITALS — BP 112/74 | HR 80 | Temp 97.8°F | Ht 65.5 in | Wt 134.2 lb

## 2017-12-04 DIAGNOSIS — Z79899 Other long term (current) drug therapy: Secondary | ICD-10-CM | POA: Diagnosis not present

## 2017-12-04 DIAGNOSIS — B20 Human immunodeficiency virus [HIV] disease: Secondary | ICD-10-CM | POA: Diagnosis not present

## 2017-12-04 DIAGNOSIS — Z0000 Encounter for general adult medical examination without abnormal findings: Secondary | ICD-10-CM | POA: Diagnosis not present

## 2017-12-04 MED ORDER — BICTEGRAVIR-EMTRICITAB-TENOFOV 50-200-25 MG PO TABS: 1.0000 | ORAL_TABLET | Freq: Every day | ORAL | 11 refills | Status: DC

## 2017-12-04 NOTE — Progress Notes (Signed)
Patient ID: Cynthia [name], female   DOB: [birth year], 52 y.o.   MRN   MRN: [id number]  HPI;  Cynthia [name] is a 52yo F with hiv disease, CD 4 count of 720/VL<20. Well controlled based on VL from April 2019. Currently on atripla. She reports that she is doing well, stayed healthy over the winter. No other health issues.   Outpatient Encounter Medications as of 12/04/2017  Medication Sig  . butalbital-acetaminophen-caffeine (FIORICET, ESGIC) 50-325-40 MG tablet Take 1-2 tablets by mouth every 6 (six) hours as needed.  . Cholecalciferol (VITAMIN D) 2000 UNITS tablet Take 2,000 Units by mouth daily.    Marland Kitchen efavirenz-emtricitabine-tenofovir (ATRIPLA) 600-200-300 MG tablet Take 1 tablet by mouth at bedtime.  Marland Kitchen lisinopril (PRINIVIL,ZESTRIL) 10 MG tablet TAKE 1 TABLET BY MOUTH ONCE DAILY  . cyclobenzaprine (FLEXERIL) 10 MG tablet Take 1 tablet (10 mg total) by mouth every 8 (eight) hours as needed for muscle spasms. (Patient not taking: Reported on 12/04/2017)  . fluticasone (FLONASE) 50 MCG/ACT nasal spray Place 2 sprays into both nostrils daily. (Patient not taking: Reported on 12/04/2017)   No facility-administered encounter medications on file as of 12/04/2017.      Patient Active Problem List   Diagnosis Date Noted  . 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 Hypertension   . HIV DISEASE 11/07/2006  . DISEASE, HYPERTENSIVE HEART, BENIGN, W/HF 11/07/2006     Health Maintenance Due  Topic Date Due  . PAP SMEAR  04/11/2017  . COLONOSCOPY  11/08/2017    Social History   Tobacco Use  . Smoking status: Former Research scientist (life sciences)  . Smokeless tobacco: Never Used  Substance Use Topics  . Alcohol use: Yes    Alcohol/week: 0.0 oz    Comment: socially  . Drug use: No   Review of Systems Review of Systems  Constitutional: Negative for fever, chills, diaphoresis, activity change, appetite change, fatigue and unexpected weight change.  HENT: Negative for congestion, sore throat, rhinorrhea, sneezing, trouble swallowing and sinus  pressure.  Eyes: Negative for photophobia and visual disturbance.  Respiratory: Negative for cough, chest tightness, shortness of breath, wheezing and stridor.  Cardiovascular: Negative for chest pain, palpitations and leg swelling.  Gastrointestinal: Negative for nausea, vomiting, abdominal pain, diarrhea, constipation, blood in stool, abdominal distention and anal bleeding.  Genitourinary: Negative for dysuria, hematuria, flank pain and difficulty urinating.  Musculoskeletal: Negative for myalgias, back pain, joint swelling, arthralgias and gait problem.  Skin: Negative for color change, pallor, rash and wound.  Neurological: Negative for dizziness, tremors, weakness and light-headedness.  Hematological: Negative for adenopathy. Does not bruise/bleed easily.  Psychiatric/Behavioral: Negative for behavioral problems, confusion, sleep disturbance, dysphoric mood, decreased concentration and agitation.    Physical Exam   BP 112/74   Pulse 80   Temp 97.8 F (36.6 C) (Oral)   Ht 5' 5.5" (1.664 m)   Wt 134 lb 4 oz (60.9 kg)   LMP 11/09/2017   BMI 22.00 kg/m   Physical Exam  Constitutional:  oriented to person, place, and time. appears well-developed and well-nourished. No distress.  HENT: [place]/AT, PERRLA, no scleral icterus Mouth/Throat: Oropharynx is clear and moist. No oropharyngeal exudate.  Cardiovascular: Normal rate, regular rhythm and normal heart sounds. Exam reveals no gallop and no friction rub.  No murmur heard.  Pulmonary/Chest: Effort normal and breath sounds normal. No respiratory distress.  has no wheezes.  Neck = supple, no nuchal rigidity Lymphadenopathy: no cervical adenopathy. No axillary adenopathy Neurological: alert and oriented to person, place, and time.  Skin:  Skin is warm and dry. No rash noted. No erythema.  Psychiatric: a normal mood and affect.  behavior is normal.   Lab Results  Component Value Date   CD4TCELL 45 05/06/2017   Lab Results  Component  Value Date   CD4TABS 720 05/06/2017   CD4TABS 780 07/04/2016   CD4TABS 640 02/29/2016   Lab Results  Component Value Date   HIV1RNAQUANT <20 NOT DETECTED 11/06/2017   Lab Results  Component Value Date   HEPBSAB No 09/30/2006   Lab Results  Component Value Date   LABRPR NON-REACTIVE 11/06/2017    CBC Lab Results  Component Value Date   WBC 4.6 05/06/2017   RBC 3.88 05/06/2017   HGB 11.6 (L) 05/06/2017   HCT 34.9 (L) 05/06/2017   PLT 224 05/06/2017   MCV 89.9 05/06/2017   MCH 29.9 05/06/2017   MCHC 33.2 05/06/2017   RDW 12.1 05/06/2017   LYMPHSABS 1,642 05/06/2017   MONOABS 352 02/29/2016   EOSABS 133 05/06/2017    BMET Lab Results  Component Value Date   NA 139 11/06/2017   K 4.4 11/06/2017   CL 109 11/06/2017   CO2 24 11/06/2017   GLUCOSE 116 (H) 11/06/2017   BUN 21 11/06/2017   CREATININE 1.12 (H) 11/06/2017   CALCIUM 9.2 11/06/2017   GFRNONAA 60 05/06/2017   GFRAA 70 05/06/2017      Assessment and Plan  hiv disease = will plan to switch to biktarvy to see if she can tolerate new medication. Ideally would like her off of EFV and off of TDF and changed to TAF. Will repeat labs in 4-6 wk while on new regimen. Will give co-pay card  Long term medication management = cr is stable. If she is unable to tolerate biktarvy and we end up changing back to [place]. Will consider bone scan due to bone SE of TDF  Health maintenance = will need colonoscopy this year as part of colon cancer screening. mammo this year already done. PAP in the fall

## 2017-12-04 NOTE — Patient Instructions (Signed)
Please come back in 4-6 wk for lab work while on new medication

## 2017-12-12 DIAGNOSIS — Z961 Presence of intraocular lens: Secondary | ICD-10-CM | POA: Diagnosis not present

## 2017-12-12 DIAGNOSIS — Z21 Asymptomatic human immunodeficiency virus [HIV] infection status: Secondary | ICD-10-CM | POA: Diagnosis not present

## 2017-12-12 DIAGNOSIS — H33021 Retinal detachment with multiple breaks, right eye: Secondary | ICD-10-CM | POA: Diagnosis not present

## 2017-12-27 MED FILL — BIKTARVY 50-200-25 MG TABS: 50-200-25 | 30 days supply | Qty: 30 | Fill #0

## 2018-01-07 ENCOUNTER — Encounter: Payer: Self-pay | Admitting: Gastroenterology

## 2018-01-07 ENCOUNTER — Telehealth: Payer: 59 | Admitting: Family

## 2018-01-07 DIAGNOSIS — R05 Cough: Secondary | ICD-10-CM | POA: Diagnosis not present

## 2018-01-07 DIAGNOSIS — R059 Cough, unspecified: Secondary | ICD-10-CM

## 2018-01-07 MED ORDER — PREDNISONE 10 MG (21) PO TBPK: ORAL_TABLET | ORAL | 0 refills | Status: DC

## 2018-01-07 MED FILL — predniSONE 10 MG (21) TBPK: 10 | 6 days supply | Qty: 21 | Fill #0

## 2018-01-07 NOTE — Progress Notes (Signed)
We are sorry that you are not feeling well.  Here is how we plan to help!  Based on your presentation I believe you most likely have A cough due to a virus.  This is called viral bronchitis and is best treated by rest, plenty of fluids and control of the cough.  You may use Ibuprofen or Tylenol as directed to help your symptoms.     In addition you may use A non-prescription cough medication called Robitussin DAC. Take 2 teaspoons every 8 hours or Delsym: take 2 teaspoons every 12 hours.  Prednisone 10 mg daily for 6 days (see taper instructions below)  Directions for 6 day taper: Day 1: 2 tablets before breakfast, 1 after both lunch & dinner and 2 at bedtime Day 2: 1 tab before breakfast, 1 after both lunch & dinner and 2 at bedtime Day 3: 1 tab at each meal & 1 at bedtime Day 4: 1 tab at breakfast, 1 at lunch, 1 at bedtime Day 5: 1 tab at breakfast & 1 tab at bedtime Day 6: 1 tab at breakfast   From your responses in the eVisit questionnaire you describe inflammation in the upper respiratory tract which is causing a significant cough.  This is commonly called Bronchitis and has four common causes:    Allergies  Viral Infections  Acid Reflux  Bacterial Infection Allergies, viruses and acid reflux are treated by controlling symptoms or eliminating the cause. An example might be a cough caused by taking certain blood pressure medications. You stop the cough by changing the medication. Another example might be a cough caused by acid reflux. Controlling the reflux helps control the cough.  USE OF BRONCHODILATOR ("RESCUE") INHALERS: There is a risk from using your bronchodilator too frequently.  The risk is that over-reliance on a medication which only relaxes the muscles surrounding the breathing tubes can reduce the effectiveness of medications prescribed to reduce swelling and congestion of the tubes themselves.  Although you feel brief relief from the bronchodilator inhaler, your asthma  may actually be worsening with the tubes becoming more swollen and filled with mucus.  This can delay other crucial treatments, such as oral steroid medications. If you need to use a bronchodilator inhaler daily, several times per day, you should discuss this with your provider.  There are probably better treatments that could be used to keep your asthma under control.     HOME CARE . Only take medications as instructed by your medical team. . Complete the entire course of an antibiotic. . Drink plenty of fluids and get plenty of rest. . Avoid close contacts especially the very young and the elderly . Cover your mouth if you cough or cough into your sleeve. . Always remember to wash your hands . A steam or ultrasonic humidifier can help congestion.   GET HELP RIGHT AWAY IF: . You develop worsening fever. . You become short of breath . You cough up blood. . Your symptoms persist after you have completed your treatment plan MAKE SURE YOU   Understand these instructions.  Will watch your condition.  Will get help right away if you are not doing well or get worse.  Your e-visit answers were reviewed by a board certified advanced clinical practitioner to complete your personal care plan.  Depending on the condition, your plan could have included both over the counter or prescription medications. If there is a problem please reply  once you have received a response from your provider. Your   safety is important to us.  If you have drug allergies check your prescription carefully.    You can use MyChart to ask questions about today's visit, request a non-urgent call back, or ask for a work or school excuse for 24 hours related to this e-Visit. If it has been greater than 24 hours you will need to follow up with your provider, or enter a new e-Visit to address those concerns. You will get an e-mail in the next two days asking about your experience.  I hope that your e-visit has been valuable and  will speed your recovery. Thank you for using e-visits.  

## 2018-01-20 MED FILL — BIKTARVY 50-200-25 MG TABS: 50-200-25 | 30 days supply | Qty: 30 | Fill #1

## 2018-02-24 ENCOUNTER — Other Ambulatory Visit: Payer: Self-pay | Admitting: Internal Medicine

## 2018-02-24 DIAGNOSIS — I10 Essential (primary) hypertension: Secondary | ICD-10-CM

## 2018-02-24 MED FILL — LISINOPRIL 10 MG TABLET: 10 | 90 days supply | Qty: 90 | Fill #0

## 2018-02-26 ENCOUNTER — Other Ambulatory Visit: Payer: Self-pay | Admitting: Pharmacist

## 2018-02-26 DIAGNOSIS — B20 Human immunodeficiency virus [HIV] disease: Secondary | ICD-10-CM

## 2018-02-27 MED FILL — BIKTARVY 50-200-25 MG TABS: 50-200-25 | 30 days supply | Qty: 30 | Fill #2

## 2018-03-12 ENCOUNTER — Other Ambulatory Visit: Payer: 59

## 2018-03-12 DIAGNOSIS — B20 Human immunodeficiency virus [HIV] disease: Secondary | ICD-10-CM | POA: Diagnosis not present

## 2018-03-14 LAB — HIV-1 RNA QUANT-NO REFLEX-BLD
HIV 1 RNA Quant: <20 copies/mL
HIV-1 RNA Quant, Log: <1.3 Log copies/mL

## 2018-03-18 ENCOUNTER — Ambulatory Visit (AMBULATORY_SURGERY_CENTER): Payer: Self-pay

## 2018-03-18 VITALS — Ht 65.5 in | Wt 138.0 lb

## 2018-03-18 DIAGNOSIS — Z1211 Encounter for screening for malignant neoplasm of colon: Secondary | ICD-10-CM

## 2018-03-18 MED ORDER — NA SULFATE-K SULFATE-MG SULF 17.5-3.13-1.6 GM/177ML PO SOLN: 1.0000 | Freq: Once | ORAL | 0 refills | Status: AC

## 2018-03-18 NOTE — Progress Notes (Signed)
Denies allergies to eggs or soy products. Denies complication of anesthesia or sedation. Denies use of weight loss medication. Denies use of O2.   Emmi instructions declined.  

## 2018-03-20 ENCOUNTER — Encounter: Payer: Self-pay | Admitting: Gastroenterology

## 2018-03-27 MED FILL — BIKTARVY 50-200-25 MG TABS: 50-200-25 | 30 days supply | Qty: 30 | Fill #3

## 2018-03-28 MED FILL — SUPREP BOWEL PREP KIT: 17.5-3.13-1 | 2 days supply | Qty: 354 | Fill #0

## 2018-04-02 ENCOUNTER — Ambulatory Visit (AMBULATORY_SURGERY_CENTER): Payer: 59 | Admitting: Gastroenterology

## 2018-04-02 ENCOUNTER — Encounter: Payer: Self-pay | Admitting: Gastroenterology

## 2018-04-02 VITALS — BP 102/67 | HR 69 | Temp 97.8°F | Resp 16 | Ht 65.0 in | Wt 138.0 lb

## 2018-04-02 DIAGNOSIS — D122 Benign neoplasm of ascending colon: Secondary | ICD-10-CM | POA: Diagnosis not present

## 2018-04-02 DIAGNOSIS — I10 Essential (primary) hypertension: Secondary | ICD-10-CM | POA: Diagnosis not present

## 2018-04-02 DIAGNOSIS — Z1211 Encounter for screening for malignant neoplasm of colon: Secondary | ICD-10-CM | POA: Diagnosis not present

## 2018-04-02 MED ORDER — SODIUM CHLORIDE 0.9 % IV SOLN: 500.0000 mL | Freq: Once | INTRAVENOUS | Status: DC

## 2018-04-02 NOTE — Progress Notes (Signed)
Pt's states no medical or surgical changes since previsit or office visit. 

## 2018-04-02 NOTE — Progress Notes (Signed)
Called to room to assist during endoscopic procedure.  Patient ID and intended procedure confirmed with present staff. Received instructions for my participation in the procedure from the performing physician.  

## 2018-04-02 NOTE — Progress Notes (Signed)
Report to PACU, RN, vss, BBS= Clear.  

## 2018-04-02 NOTE — Op Note (Signed)
[facility] Patient Name: Cynthia [name] Procedure Date: 04/02/2018 8:59 AM MRN: [id number] Endoscopist: Remo Lipps P. Havery Moros , MD Age: 52 Referring MD:  Date of Birth: 16-Dec-1965 Gender: Female Account #: [id number] Procedure:                Colonoscopy Indications:              Screening for colorectal malignant neoplasm, This                            is the patient's first colonoscopy Medicines:                Monitored Anesthesia Care Procedure:                Pre-Anesthesia Assessment:                           - Prior to the procedure, a History and Physical                            was performed, and patient medications and                            allergies were reviewed. The patient's tolerance of                            previous anesthesia was also reviewed. The risks                            and benefits of the procedure and the sedation                            options and risks were discussed with the patient.                            All questions were answered, and informed consent                            was obtained. Prior Anticoagulants: The patient has                            taken no previous anticoagulant or antiplatelet                            agents. ASA Grade Assessment: III - A patient with                            severe systemic disease. After reviewing the risks                            and benefits, the patient was deemed in                            satisfactory condition to undergo the procedure.  After obtaining informed consent, the colonoscope                            was passed under direct vision. Throughout the                            procedure, the patient's blood pressure, pulse, and                            oxygen saturations were monitored continuously. The                            Colonoscope was introduced through the anus and                            advanced to the the  cecum, identified by                            appendiceal orifice and ileocecal valve. The                            colonoscopy was performed without difficulty. The                            patient tolerated the procedure well. The quality                            of the bowel preparation was good. The ileocecal                            valve, appendiceal orifice, and rectum were                            photographed. Scope In: 9:03:58 AM Scope Out: 9:20:20 AM Scope Withdrawal Time: 0 hours 12 minutes 2 seconds  Total Procedure Duration: 0 hours 16 minutes 22 seconds  Findings:                 The perianal and digital rectal examinations were                            normal.                           A 3 mm polyp was found in the ascending colon. The                            polyp was sessile. The polyp was removed with a                            cold snare. Resection and retrieval were complete.                           A single small angiodysplastic lesion was found in  the rectum.                           The exam was otherwise without abnormality on                            direct and retroflexion views. Complications:            No immediate complications. Estimated blood loss:                            Minimal. Estimated Blood Loss:     Estimated blood loss was minimal. Impression:               - One 3 mm polyp in the ascending colon, removed                            with a cold snare. Resected and retrieved.                           - A single colonic angiodysplastic lesion.                           - The examination was otherwise normal on direct                            and retroflexion views. Recommendation:           - Patient has a contact number available for                            emergencies. The signs and symptoms of potential                            delayed complications were discussed with the                             patient. Return to normal activities tomorrow.                            Written discharge instructions were provided to the                            patient.                           - Resume previous diet.                           - Continue present medications.                           - Await pathology results.                           - Repeat colonoscopy for surveillance based on  pathology results. Remo Lipps P. Havery Moros, MD 04/02/2018 9:23:25 AM This report has been signed electronically.

## 2018-04-02 NOTE — Patient Instructions (Signed)
YOU HAD AN ENDOSCOPIC PROCEDURE TODAY AT THE [facility]:   Refer to the procedure report that was given to you for any specific questions about what was found during the examination.  If the procedure report does not answer your questions, please call your gastroenterologist to clarify.  If you requested that your care partner not be given the details of your procedure findings, then the procedure report has been included in a sealed envelope for you to review at your convenience later.  YOU SHOULD EXPECT: Some feelings of bloating in the abdomen. Passage of more gas than usual.  Walking can help get rid of the air that was put into your GI tract during the procedure and reduce the bloating. If you had a lower endoscopy (such as a colonoscopy or flexible sigmoidoscopy) you may notice spotting of blood in your stool or on the toilet paper. If you underwent a bowel prep for your procedure, you may not have a normal bowel movement for a few days.  Please Note:  You might notice some irritation and congestion in your nose or some drainage.  This is from the oxygen used during your procedure.  There is no need for concern and it should clear up in a day or so.  SYMPTOMS TO REPORT IMMEDIATELY:   Following lower endoscopy (colonoscopy or flexible sigmoidoscopy):  Excessive amounts of blood in the stool  Significant tenderness or worsening of abdominal pains  Swelling of the abdomen that is new, acute  Fever of 100F or higher  For urgent or emergent issues, a gastroenterologist can be reached at any hour by calling [phone].   DIET:  We do recommend a small meal at first, but then you may proceed to your regular diet.  Drink plenty of fluids but you should avoid alcoholic beverages for 24 hours.  MEDICATIONS: Continue present medications.  Please see handouts given to you by your recovery nurse.  ACTIVITY:  You should plan to take it easy for the rest of today and you should NOT  DRIVE or use heavy machinery until tomorrow (because of the sedation medicines used during the test).    FOLLOW UP: Our staff will call the number listed on your records the next business day following your procedure to check on you and address any questions or concerns that you may have regarding the information given to you following your procedure. If we do not reach you, we will leave a message.  However, if you are feeling well and you are not experiencing any problems, there is no need to return our call.  We will assume that you have returned to your regular daily activities without incident.  If any biopsies were taken you will be contacted by phone or by letter within the next 1-3 weeks.  Please call us at [phone] if you have not heard about the biopsies in 3 weeks.   Thank you for allowing us to provide for your healthcare needs today.   SIGNATURES/CONFIDENTIALITY: You and/or your care partner have signed paperwork which will be entered into your electronic medical record.  These signatures attest to the fact that that the information above on your After Visit Summary has been reviewed and is understood.  Full responsibility of the confidentiality of this discharge information lies with you and/or your care-partner. 

## 2018-04-03 ENCOUNTER — Telehealth: Payer: Self-pay

## 2018-04-03 NOTE — Telephone Encounter (Signed)
  Follow up Call-  Call back number 04/02/2018  Post procedure Call Back phone  # [phone]  Permission to leave phone message Yes  Some recent data might be hidden     Patient questions:  Do you have a fever, pain , or abdominal swelling? No. Pain Score  0 *  Have you tolerated food without any problems? Yes.    Have you been able to return to your normal activities? Yes.    Do you have any questions about your discharge instructions: Diet   No. Medications  No. Follow up visit  No.  Do you have questions or concerns about your Care? No.  Actions: * If pain score is 4 or above: No action needed, pain <4.

## 2018-04-24 MED FILL — BIKTARVY 50-200-25 MG TABS: 50-200-25 | 30 days supply | Qty: 30 | Fill #4

## 2018-05-19 DIAGNOSIS — L821 Other seborrheic keratosis: Secondary | ICD-10-CM | POA: Diagnosis not present

## 2018-05-19 DIAGNOSIS — L814 Other melanin hyperpigmentation: Secondary | ICD-10-CM | POA: Diagnosis not present

## 2018-05-19 DIAGNOSIS — D225 Melanocytic nevi of trunk: Secondary | ICD-10-CM | POA: Diagnosis not present

## 2018-05-19 DIAGNOSIS — Z23 Encounter for immunization: Secondary | ICD-10-CM | POA: Diagnosis not present

## 2018-05-19 DIAGNOSIS — L739 Follicular disorder, unspecified: Secondary | ICD-10-CM | POA: Diagnosis not present

## 2018-05-22 MED FILL — BIKTARVY 50-200-25 MG TABS: 50-200-25 | 30 days supply | Qty: 30 | Fill #5

## 2018-06-16 MED FILL — BIKTARVY 50-200-25 MG TABS: 50-200-25 | 30 days supply | Qty: 30 | Fill #6

## 2018-07-14 MED FILL — BIKTARVY 50-200-25 MG TABS: 50-200-25 | 30 days supply | Qty: 30 | Fill #7

## 2018-07-14 MED FILL — LISINOPRIL 10 MG TABLET: 10 | 90 days supply | Qty: 90 | Fill #1

## 2018-07-16 ENCOUNTER — Encounter: Payer: Self-pay | Admitting: Women's Health

## 2018-07-16 ENCOUNTER — Ambulatory Visit (INDEPENDENT_AMBULATORY_CARE_PROVIDER_SITE_OTHER): Payer: 59 | Admitting: Women's Health

## 2018-07-16 VITALS — BP 120/82 | Ht 65.0 in | Wt 140.0 lb

## 2018-07-16 DIAGNOSIS — Z01419 Encounter for gynecological examination (general) (routine) without abnormal findings: Secondary | ICD-10-CM

## 2018-07-16 DIAGNOSIS — Z13220 Encounter for screening for lipoid disorders: Secondary | ICD-10-CM

## 2018-07-16 DIAGNOSIS — R635 Abnormal weight gain: Secondary | ICD-10-CM

## 2018-07-16 NOTE — Progress Notes (Signed)
Cynthia [name] 25-Jan-1966 [id number]    History:    Presents for annual exam.  Regular monthly cycle until September, has had 1 day of spotting since.  Normal Pap and mammogram history.  03/2018 benign colon polyp 5-year follow-up.  Primary care manages hypertension.  HIV positive many years viral load 0.  Has gained about 18 pounds over the past 2 to 3 years.  Past medical history, past surgical history, family history and social history were all reviewed and documented in the EPIC chart.  Works at [employer], Freight forwarder. 1 daughter doing well and has 3 granddaughters.  ROS:  A ROS was performed and pertinent positives and negatives are included.  Exam:  Vitals:   07/16/18 1327  BP: 120/82  Weight: 140 lb (63.5 kg)  Height: 5\' 5"  (1.651 m)   Body mass index is 23.3 kg/m.   General appearance:  Normal Thyroid:  Symmetrical, normal in size, without palpable masses or nodularity. Respiratory  Auscultation:  Clear without wheezing or rhonchi Cardiovascular  Auscultation:  Regular rate, without rubs, murmurs or gallops  Edema/varicosities:  Not grossly evident Abdominal  Soft,nontender, without masses, guarding or rebound.  Liver/spleen:  No organomegaly noted  Hernia:  None appreciated  Skin  Inspection:  Grossly normal   Breasts: Examined lying and sitting.     Right: Without masses, retractions, discharge or axillary adenopathy.     Left: Without masses, retractions, discharge or axillary adenopathy. Gentitourinary   Inguinal/mons:  Normal without inguinal adenopathy  External genitalia:  Normal  BUS/Urethra/Skene's glands:  Normal  Vagina:  Normal  Cervix:  Normal  Uterus: normal in size, shape and contour.  Midline and mobile  Adnexa/parametria:     Rt: Without masses or tenderness.   Lt: Without masses or tenderness.  Anus and perineum: Normal  Digital rectal exam: Normal sphincter tone without palpated masses or tenderness  Assessment/Plan:  52 y.o. MWF G4, P1 for annual  exam with no complaint other than weight gain.  Perimenopausal/condoms Hypertension-primary care manages HIV positive with 0 viral load Weight gain  Plan: We will come back fasting for CBC, CMP, lipid panel, and TSH.  Pap.  Pap normal with negative HR HPV 2018,  screening guidelines reviewed.Marland Kitchen  SBEs,  continue annual screening mammogram, calcium rich foods, vitamin D 2000 daily encouraged.   Huel Cote [facility], 2:12 PM 07/16/2018

## 2018-07-16 NOTE — Patient Instructions (Signed)
Health Maintenance for Postmenopausal Women Menopause is a normal process in which your reproductive ability comes to an end. This process happens gradually over a span of months to years, usually between the ages of 22 and 9. Menopause is complete when you have missed 12 consecutive menstrual periods. It is important to talk with your health care provider about some of the most common conditions that affect postmenopausal women, such as heart disease, cancer, and bone loss (osteoporosis). Adopting a healthy lifestyle and getting preventive care can help to promote your health and wellness. Those actions can also lower your chances of developing some of these common conditions. What should I know about menopause? During menopause, you may experience a number of symptoms, such as:  Moderate-to-severe hot flashes.  Night sweats.  Decrease in sex drive.  Mood swings.  Headaches.  Tiredness.  Irritability.  Memory problems.  Insomnia.  Choosing to treat or not to treat menopausal changes is an individual decision that you make with your health care provider. What should I know about hormone replacement therapy and supplements? Hormone therapy products are effective for treating symptoms that are associated with menopause, such as hot flashes and night sweats. Hormone replacement carries certain risks, especially as you become older. If you are thinking about using estrogen or estrogen with progestin treatments, discuss the benefits and risks with your health care provider. What should I know about heart disease and stroke? Heart disease, heart attack, and stroke become more likely as you age. This may be due, in part, to the hormonal changes that your body experiences during menopause. These can affect how your body processes dietary fats, triglycerides, and cholesterol. Heart attack and stroke are both medical emergencies. There are many things that you can do to help prevent heart disease  and stroke:  Have your blood pressure checked at least every 1-2 years. High blood pressure causes heart disease and increases the risk of stroke.  If you are 53-22 years old, ask your health care provider if you should take aspirin to prevent a heart attack or a stroke.  Do not use any tobacco products, including cigarettes, chewing tobacco, or electronic cigarettes. If you need help quitting, ask your health care provider.  It is important to eat a healthy diet and maintain a healthy weight. ? Be sure to include plenty of vegetables, fruits, low-fat dairy products, and lean protein. ? Avoid eating foods that are high in solid fats, added sugars, or salt (sodium).  Get regular exercise. This is one of the most important things that you can do for your health. ? Try to exercise for at least 150 minutes each week. The type of exercise that you do should increase your heart rate and make you sweat. This is known as moderate-intensity exercise. ? Try to do strengthening exercises at least twice each week. Do these in addition to the moderate-intensity exercise.  Know your numbers.Ask your health care provider to check your cholesterol and your blood glucose. Continue to have your blood tested as directed by your health care provider.  What should I know about cancer screening? There are several types of cancer. Take the following steps to reduce your risk and to catch any cancer development as early as possible. Breast Cancer  Practice breast self-awareness. ? This means understanding how your breasts normally appear and feel. ? It also means doing regular breast self-exams. Let your health care provider know about any changes, no matter how small.  If you are 40  or older, have a clinician do a breast exam (clinical breast exam or CBE) every year. Depending on your age, family history, and medical history, it may be recommended that you also have a yearly breast X-ray (mammogram).  If you  have a family history of breast cancer, talk with your health care provider about genetic screening.  If you are at high risk for breast cancer, talk with your health care provider about having an MRI and a mammogram every year.  Breast cancer (BRCA) gene test is recommended for women who have family members with BRCA-related cancers. Results of the assessment will determine the need for genetic counseling and BRCA1 and for BRCA2 testing. BRCA-related cancers include these types: ? Breast. This occurs in males or females. ? Ovarian. ? Tubal. This may also be called fallopian tube cancer. ? Cancer of the abdominal or pelvic lining (peritoneal cancer). ? Prostate. ? Pancreatic.  Cervical, Uterine, and Ovarian Cancer Your health care provider may recommend that you be screened regularly for cancer of the pelvic organs. These include your ovaries, uterus, and vagina. This screening involves a pelvic exam, which includes checking for microscopic changes to the surface of your cervix (Pap test).  For women ages 21-65, health care providers may recommend a pelvic exam and a Pap test every three years. For women ages 79-65, they may recommend the Pap test and pelvic exam, combined with testing for human papilloma virus (HPV), every five years. Some types of HPV increase your risk of cervical cancer. Testing for HPV may also be done on women of any age who have unclear Pap test results.  Other health care providers may not recommend any screening for nonpregnant women who are considered low risk for pelvic cancer and have no symptoms. Ask your health care provider if a screening pelvic exam is right for you.  If you have had past treatment for cervical cancer or a condition that could lead to cancer, you need Pap tests and screening for cancer for at least 20 years after your treatment. If Pap tests have been discontinued for you, your risk factors (such as having a new sexual partner) need to be  reassessed to determine if you should start having screenings again. Some women have medical problems that increase the chance of getting cervical cancer. In these cases, your health care provider may recommend that you have screening and Pap tests more often.  If you have a family history of uterine cancer or ovarian cancer, talk with your health care provider about genetic screening.  If you have vaginal bleeding after reaching menopause, tell your health care provider.  There are currently no reliable tests available to screen for ovarian cancer.  Lung Cancer Lung cancer screening is recommended for adults 69-62 years old who are at high risk for lung cancer because of a history of smoking. A yearly low-dose CT scan of the lungs is recommended if you:  Currently smoke.  Have a history of at least 30 pack-years of smoking and you currently smoke or have quit within the past 15 years. A pack-year is smoking an average of one pack of cigarettes per day for one year.  Yearly screening should:  Continue until it has been 15 years since you quit.  Stop if you develop a health problem that would prevent you from having lung cancer treatment.  Colorectal Cancer  This type of cancer can be detected and can often be prevented.  Routine colorectal cancer screening usually begins at  age 42 and continues through age 45.  If you have risk factors for colon cancer, your health care provider may recommend that you be screened at an earlier age.  If you have a family history of colorectal cancer, talk with your health care provider about genetic screening.  Your health care provider may also recommend using home test kits to check for hidden blood in your stool.  A small camera at the end of a tube can be used to examine your colon directly (sigmoidoscopy or colonoscopy). This is done to check for the earliest forms of colorectal cancer.  Direct examination of the colon should be repeated every  5-10 years until age 71. However, if early forms of precancerous polyps or small growths are found or if you have a family history or genetic risk for colorectal cancer, you may need to be screened more often.  Skin Cancer  Check your skin from head to toe regularly.  Monitor any moles. Be sure to tell your health care provider: ? About any new moles or changes in moles, especially if there is a change in a mole's shape or color. ? If you have a mole that is larger than the size of a pencil eraser.  If any of your family members has a history of skin cancer, especially at a Fong Mccarry age, talk with your health care provider about genetic screening.  Always use sunscreen. Apply sunscreen liberally and repeatedly throughout the day.  Whenever you are outside, protect yourself by wearing long sleeves, pants, a wide-brimmed hat, and sunglasses.  What should I know about osteoporosis? Osteoporosis is a condition in which bone destruction happens more quickly than new bone creation. After menopause, you may be at an increased risk for osteoporosis. To help prevent osteoporosis or the bone fractures that can happen because of osteoporosis, the following is recommended:  If you are 46-71 years old, get at least 1,000 mg of calcium and at least 600 mg of vitamin D per day.  If you are older than age 55 but younger than age 65, get at least 1,200 mg of calcium and at least 600 mg of vitamin D per day.  If you are older than age 54, get at least 1,200 mg of calcium and at least 800 mg of vitamin D per day.  Smoking and excessive alcohol intake increase the risk of osteoporosis. Eat foods that are rich in calcium and vitamin D, and do weight-bearing exercises several times each week as directed by your health care provider. What should I know about how menopause affects my mental health? Depression may occur at any age, but it is more common as you become older. Common symptoms of depression  include:  Low or sad mood.  Changes in sleep patterns.  Changes in appetite or eating patterns.  Feeling an overall lack of motivation or enjoyment of activities that you previously enjoyed.  Frequent crying spells.  Talk with your health care provider if you think that you are experiencing depression. What should I know about immunizations? It is important that you get and maintain your immunizations. These include:  Tetanus, diphtheria, and pertussis (Tdap) booster vaccine.  Influenza every year before the flu season begins.  Pneumonia vaccine.  Shingles vaccine.  Your health care provider may also recommend other immunizations. This information is not intended to replace advice given to you by your health care provider. Make sure you discuss any questions you have with your health care provider. Document Released: 09/14/2005  Document Revised: 02/10/2016 Document Reviewed: 04/26/2015 Elsevier Interactive Patient Education  2018 [employer]  

## 2018-07-17 LAB — PAP IG W/ RFLX HPV ASCU

## 2018-07-18 LAB — URINALYSIS, COMPLETE W/RFL CULTURE
BACTERIA UA: NONE SEEN /HPF
Bilirubin Urine: NEGATIVE
Glucose, UA: NEGATIVE
Hgb urine dipstick: NEGATIVE
Hyaline Cast: NONE SEEN /LPF
Ketones, ur: NEGATIVE
LEUKOCYTE ESTERASE: NEGATIVE
Nitrites, Initial: NEGATIVE
PH: 7 (ref 5.0–8.0)
Protein, ur: NEGATIVE
Specific Gravity, Urine: 1.018 (ref 1.001–1.03)
WBC, UA: NONE SEEN /HPF (ref 0–5)

## 2018-07-18 LAB — URINE CULTURE
MICRO NUMBER:: 91489063
SPECIMEN QUALITY:: ADEQUATE

## 2018-07-18 LAB — CULTURE INDICATED

## 2018-07-22 ENCOUNTER — Other Ambulatory Visit: Payer: 59

## 2018-07-22 DIAGNOSIS — R635 Abnormal weight gain: Secondary | ICD-10-CM

## 2018-07-22 DIAGNOSIS — Z01419 Encounter for gynecological examination (general) (routine) without abnormal findings: Secondary | ICD-10-CM

## 2018-07-22 DIAGNOSIS — Z13220 Encounter for screening for lipoid disorders: Secondary | ICD-10-CM

## 2018-07-22 LAB — CBC WITH DIFFERENTIAL/PLATELET
Absolute Monocytes: 317 cells/uL (ref 200–950)
Basophils Absolute: 18 cells/uL (ref 0–200)
Basophils Relative: 0.4 %
Eosinophils Absolute: 101 cells/uL (ref 15–500)
Eosinophils Relative: 2.2 %
HCT: 37.7 % (ref 35.0–45.0)
Hemoglobin: 12.9 g/dL (ref 11.7–15.5)
Lymphs Abs: 1357 cells/uL (ref 850–3900)
MCH: 32.3 pg (ref 27.0–33.0)
MCHC: 34.2 g/dL (ref 32.0–36.0)
MCV: 94.3 fL (ref 80.0–100.0)
MPV: 9.9 fL (ref 7.5–12.5)
Monocytes Relative: 6.9 %
Neutro Abs: 2806 cells/uL (ref 1500–7800)
Neutrophils Relative %: 61 %
Platelets: 209 10*3/uL (ref 140–400)
RBC: 4 10*6/uL (ref 3.80–5.10)
RDW: 12 % (ref 11.0–15.0)
Total Lymphocyte: 29.5 %
WBC: 4.6 10*3/uL (ref 3.8–10.8)

## 2018-07-22 LAB — COMPREHENSIVE METABOLIC PANEL
AG Ratio: 1.7 (calc) (ref 1.0–2.5)
ALBUMIN MSPROF: 4.3 g/dL (ref 3.6–5.1)
ALT: 18 U/L (ref 6–29)
AST: 18 U/L (ref 10–35)
Alkaline phosphatase (APISO): 50 U/L (ref 33–130)
BUN: 18 mg/dL (ref 7–25)
CO2: 27 mmol/L (ref 20–32)
Calcium: 9.5 mg/dL (ref 8.6–10.4)
Chloride: 105 mmol/L (ref 98–110)
Creat: 1.02 mg/dL (ref 0.50–1.05)
GLUCOSE: 88 mg/dL (ref 65–99)
Globulin: 2.6 g/dL (calc) (ref 1.9–3.7)
Potassium: 4.3 mmol/L (ref 3.5–5.3)
Sodium: 137 mmol/L (ref 135–146)
Total Bilirubin: 0.3 mg/dL (ref 0.2–1.2)
Total Protein: 6.9 g/dL (ref 6.1–8.1)

## 2018-07-22 LAB — LIPID PANEL
Cholesterol: 214 mg/dL — ABNORMAL HIGH (ref <200)
HDL: 53 mg/dL (ref >50)
LDL Cholesterol (Calc): 137 mg/dL (calc) — ABNORMAL HIGH
Non-HDL Cholesterol (Calc): 161 mg/dL (calc) — ABNORMAL HIGH (ref <130)
Total CHOL/HDL Ratio: 4 (calc) (ref <5.0)
Triglycerides: 118 mg/dL (ref <150)

## 2018-07-22 LAB — TSH: TSH: 1.89 mIU/L

## 2018-08-18 MED FILL — BIKTARVY 50-200-25 MG TABS: 50-200-25 | 30 days supply | Qty: 30 | Fill #8

## 2018-09-12 MED FILL — BIKTARVY 50-200-25 MG TABS: 50-200-25 | 30 days supply | Qty: 30 | Fill #9

## 2018-10-17 ENCOUNTER — Other Ambulatory Visit: Payer: Self-pay | Admitting: Gynecology

## 2018-10-17 DIAGNOSIS — Z1231 Encounter for screening mammogram for malignant neoplasm of breast: Secondary | ICD-10-CM

## 2018-10-20 MED FILL — BIKTARVY 50-200-25 MG TABS: 50-200-25 | 30 days supply | Qty: 30 | Fill #10

## 2018-11-03 ENCOUNTER — Other Ambulatory Visit: Payer: Self-pay | Admitting: Internal Medicine

## 2018-11-03 DIAGNOSIS — I10 Essential (primary) hypertension: Secondary | ICD-10-CM

## 2018-11-11 MED FILL — BIKTARVY 50-200-25 MG TABS: 50-200-25 | 30 days supply | Qty: 30 | Fill #11

## 2018-11-11 MED FILL — LISINOPRIL 10 MG TABLET: 10 | 90 days supply | Qty: 90 | Fill #0

## 2018-12-05 ENCOUNTER — Other Ambulatory Visit: Payer: Self-pay | Admitting: Internal Medicine

## 2018-12-18 DIAGNOSIS — H33021 Retinal detachment with multiple breaks, right eye: Secondary | ICD-10-CM | POA: Diagnosis not present

## 2018-12-18 DIAGNOSIS — Z21 Asymptomatic human immunodeficiency virus [HIV] infection status: Secondary | ICD-10-CM | POA: Diagnosis not present

## 2018-12-18 DIAGNOSIS — Z961 Presence of intraocular lens: Secondary | ICD-10-CM | POA: Diagnosis not present

## 2018-12-18 MED FILL — BIKTARVY 50-200-25 MG TABS: 50-200-25 | 30 days supply | Qty: 30 | Fill #0

## 2019-01-07 ENCOUNTER — Ambulatory Visit: Payer: 59

## 2019-01-07 ENCOUNTER — Other Ambulatory Visit: Payer: Self-pay

## 2019-01-07 ENCOUNTER — Ambulatory Visit
Admission: RE | Admit: 2019-01-07 | Discharge: 2019-01-07 | Disposition: A | Discharge status: Home / Self Care | Payer: 59 | Source: Ambulatory Visit | Attending: Gynecology | Admitting: Gynecology

## 2019-01-07 DIAGNOSIS — Z1231 Encounter for screening mammogram for malignant neoplasm of breast: Secondary | ICD-10-CM

## 2019-01-13 MED FILL — BIKTARVY 50-200-25 MG TABS: 50-200-25 | 30 days supply | Qty: 30 | Fill #1

## 2019-01-26 ENCOUNTER — Other Ambulatory Visit: Payer: 59

## 2019-01-27 ENCOUNTER — Other Ambulatory Visit: Payer: 59

## 2019-01-27 ENCOUNTER — Other Ambulatory Visit (HOSPITAL_COMMUNITY)
Admission: RE | Admit: 2019-01-27 | Discharge: 2019-01-27 | Disposition: A | Discharge status: Home / Self Care | Payer: 59 | Source: Ambulatory Visit | Attending: Internal Medicine | Admitting: Internal Medicine

## 2019-01-27 ENCOUNTER — Other Ambulatory Visit: Payer: Self-pay

## 2019-01-27 DIAGNOSIS — Z79899 Other long term (current) drug therapy: Secondary | ICD-10-CM | POA: Diagnosis not present

## 2019-01-27 DIAGNOSIS — B20 Human immunodeficiency virus [HIV] disease: Secondary | ICD-10-CM | POA: Diagnosis not present

## 2019-01-27 DIAGNOSIS — Z113 Encounter for screening for infections with a predominantly sexual mode of transmission: Secondary | ICD-10-CM

## 2019-01-28 LAB — T-HELPER CELL (CD4) - (RCID CLINIC ONLY)
CD4 % Helper T Cell: 51 % (ref 33–65)
CD4 T Cell Abs: 785 /uL (ref 400–1790)

## 2019-01-29 LAB — URINE CYTOLOGY ANCILLARY ONLY
Chlamydia: NEGATIVE
Neisseria Gonorrhea: NEGATIVE

## 2019-02-02 LAB — CBC WITH DIFFERENTIAL/PLATELET
Absolute Monocytes: 342 cells/uL (ref 200–950)
Basophils Absolute: 41 cells/uL (ref 0–200)
Basophils Relative: 0.8 %
Eosinophils Absolute: 143 cells/uL (ref 15–500)
Eosinophils Relative: 2.8 %
HCT: 37 % (ref 35.0–45.0)
Hemoglobin: 12.7 g/dL (ref 11.7–15.5)
Lymphs Abs: 1622 cells/uL (ref 850–3900)
MCH: 32.6 pg (ref 27.0–33.0)
MCHC: 34.3 g/dL (ref 32.0–36.0)
MCV: 94.9 fL (ref 80.0–100.0)
MPV: 9.9 fL (ref 7.5–12.5)
Monocytes Relative: 6.7 %
Neutro Abs: 2953 cells/uL (ref 1500–7800)
Neutrophils Relative %: 57.9 %
Platelets: 203 10*3/uL (ref 140–400)
RBC: 3.9 10*6/uL (ref 3.80–5.10)
RDW: 12.9 % (ref 11.0–15.0)
Total Lymphocyte: 31.8 %
WBC: 5.1 10*3/uL (ref 3.8–10.8)

## 2019-02-02 LAB — LIPID PANEL
Cholesterol: 219 mg/dL — ABNORMAL HIGH (ref <200)
HDL: 41 mg/dL — ABNORMAL LOW (ref >=50)
LDL Cholesterol (Calc): 138 mg/dL (calc) — ABNORMAL HIGH
Non-HDL Cholesterol (Calc): 178 mg/dL (calc) — ABNORMAL HIGH (ref <130)
Total CHOL/HDL Ratio: 5.3 (calc) — ABNORMAL HIGH (ref <5.0)
Triglycerides: 247 mg/dL — ABNORMAL HIGH (ref <150)

## 2019-02-02 LAB — RPR: RPR Ser Ql: NONREACTIVE

## 2019-02-02 LAB — COMPREHENSIVE METABOLIC PANEL
AG Ratio: 1.7 (calc) (ref 1.0–2.5)
ALT: 9 U/L (ref 6–29)
AST: 16 U/L (ref 10–35)
Albumin: 4.3 g/dL (ref 3.6–5.1)
Alkaline phosphatase (APISO): 46 U/L (ref 37–153)
BUN/Creatinine Ratio: 22 (calc) (ref 6–22)
BUN: 26 mg/dL — ABNORMAL HIGH (ref 7–25)
CO2: 25 mmol/L (ref 20–32)
Calcium: 9.4 mg/dL (ref 8.6–10.4)
Chloride: 104 mmol/L (ref 98–110)
Creat: 1.2 mg/dL — ABNORMAL HIGH (ref 0.50–1.05)
Globulin: 2.5 g/dL (calc) (ref 1.9–3.7)
Glucose, Bld: 108 mg/dL — ABNORMAL HIGH (ref 65–99)
Potassium: 4.3 mmol/L (ref 3.5–5.3)
Sodium: 137 mmol/L (ref 135–146)
Total Bilirubin: 0.3 mg/dL (ref 0.2–1.2)
Total Protein: 6.8 g/dL (ref 6.1–8.1)

## 2019-02-02 LAB — HIV-1 RNA QUANT-NO REFLEX-BLD
HIV 1 RNA Quant: <20 copies/mL
HIV-1 RNA Quant, Log: <1.3 Log copies/mL

## 2019-02-05 ENCOUNTER — Other Ambulatory Visit: Payer: Self-pay | Admitting: Internal Medicine

## 2019-02-09 ENCOUNTER — Ambulatory Visit: Payer: 59 | Admitting: Internal Medicine

## 2019-02-09 ENCOUNTER — Encounter: Payer: Self-pay | Admitting: Internal Medicine

## 2019-02-09 ENCOUNTER — Other Ambulatory Visit: Payer: Self-pay

## 2019-02-09 VITALS — BP 106/72 | HR 73 | Temp 97.9°F | Wt 138.0 lb

## 2019-02-09 DIAGNOSIS — Z79899 Other long term (current) drug therapy: Secondary | ICD-10-CM | POA: Diagnosis not present

## 2019-02-09 DIAGNOSIS — B20 Human immunodeficiency virus [HIV] disease: Secondary | ICD-10-CM | POA: Diagnosis not present

## 2019-02-12 MED FILL — BIKTARVY 50-200-25 MG TABS: 50-200-25 | 30 days supply | Qty: 30 | Fill #0

## 2019-02-25 ENCOUNTER — Other Ambulatory Visit: Payer: Self-pay | Admitting: Internal Medicine

## 2019-02-25 DIAGNOSIS — I10 Essential (primary) hypertension: Secondary | ICD-10-CM

## 2019-02-25 MED FILL — LISINOPRIL 10 MG TABLET: 10 | 90 days supply | Qty: 90 | Fill #0

## 2019-03-11 NOTE — Progress Notes (Signed)
RFV: follow up for hiv disease Patient ID: Cynthia [name], female   DOB: [birth year], 53 y.o.   MRN: [id number]  HPI Cynthia [name] is a 53yo F with well controlled hiv disease. Has excellent adherence but most recently has been very busy at work. She has been doing well overall/no illness.  Outpatient Encounter Medications as of 02/09/2019  Medication Sig  . cetirizine (ZYRTEC) 10 MG tablet Take 10 mg by mouth daily.  . Cholecalciferol (VITAMIN D) 2000 UNITS tablet Take 2,000 Units by mouth daily.    Marland Kitchen lisinopril (PRINIVIL,ZESTRIL) 10 MG tablet TAKE 1 TABLET BY MOUTH ONCE DAILY  . [DISCONTINUED] BIKTARVY 50-200-25 MG TABS tablet TAKE 1 TABLET BY MOUTH DAILY.   Facility-Administered Encounter Medications as of 02/09/2019  Medication  . 0.9 %  sodium chloride infusion     Patient Active Problem List   Diagnosis Date Noted  . 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 Hypertension   . HIV DISEASE 11/07/2006  . DISEASE, HYPERTENSIVE HEART, BENIGN, W/HF 11/07/2006     Health Maintenance Due  Topic Date Due  . INFLUENZA VACCINE  03/07/2019   Social History   Tobacco Use  . Smoking status: Former Research scientist (life sciences)  . Smokeless tobacco: Never Used  . Tobacco comment: Quit 1996  Substance Use Topics  . Alcohol use: Yes    Alcohol/week: 0.0 standard drinks    Comment: socially  . Drug use: No   Review of Systems Review of Systems  Constitutional: Negative for fever, chills, diaphoresis, activity change, appetite change, fatigue and unexpected weight change.  HENT: Negative for congestion, sore throat, rhinorrhea, sneezing, trouble swallowing and sinus pressure.  Eyes: Negative for photophobia and visual disturbance.  Respiratory: Negative for cough, chest tightness, shortness of breath, wheezing and stridor.  Cardiovascular: Negative for chest pain, palpitations and leg swelling.  Gastrointestinal: Negative for nausea, vomiting, abdominal pain, diarrhea, constipation, blood in stool, abdominal distention and anal  bleeding.  Genitourinary: Negative for dysuria, hematuria, flank pain and difficulty urinating.  Musculoskeletal: Negative for myalgias, back pain, joint swelling, arthralgias and gait problem.  Skin: Negative for color change, pallor, rash and wound.  Neurological: Negative for dizziness, tremors, weakness and light-headedness.  Hematological: Negative for adenopathy. Does not bruise/bleed easily.  Psychiatric/Behavioral: Negative for behavioral problems, confusion, sleep disturbance, dysphoric mood, decreased concentration and agitation.    Physical Exam   BP 106/72   Pulse 73   Temp 97.9 F (36.6 C) (Oral)   Wt 138 lb (62.6 kg)   BMI 22.96 kg/m   Physical Exam  Constitutional:  oriented to person, place, and time. appears well-developed and well-nourished. No distress.  HENT: [place]/AT, PERRLA, no scleral icterus Mouth/Throat: Oropharynx is clear and moist. No oropharyngeal exudate.  Cardiovascular: Normal rate, regular rhythm and normal heart sounds. Exam reveals no gallop and no friction rub.  No murmur heard.  Pulmonary/Chest: Effort normal and breath sounds normal. No respiratory distress.  has no wheezes.  Neck = supple, no nuchal rigidity Lymphadenopathy: no cervical adenopathy. No axillary adenopathy Neurological: alert and oriented to person, place, and time.  Skin: Skin is warm and dry. No rash noted. No erythema.  Psychiatric: a normal mood and affect.  behavior is normal.   Lab Results  Component Value Date   CD4TCELL 51 01/27/2019   Lab Results  Component Value Date   CD4TABS 785 01/27/2019   CD4TABS 720 05/06/2017   CD4TABS 780 07/04/2016   Lab Results  Component Value Date   HIV1RNAQUANT <20 NOT DETECTED  01/27/2019   Lab Results  Component Value Date   HEPBSAB No 09/30/2006   Lab Results  Component Value Date   LABRPR NON-REACTIVE 01/27/2019    CBC Lab Results  Component Value Date   WBC 5.1 01/27/2019   RBC 3.90 01/27/2019   HGB 12.7 01/27/2019    HCT 37.0 01/27/2019   PLT 203 01/27/2019   MCV 94.9 01/27/2019   MCH 32.6 01/27/2019   MCHC 34.3 01/27/2019   RDW 12.9 01/27/2019   LYMPHSABS 1,622 01/27/2019   MONOABS 352 02/29/2016   EOSABS 143 01/27/2019    BMET Lab Results  Component Value Date   NA 137 01/27/2019   K 4.3 01/27/2019   CL 104 01/27/2019   CO2 25 01/27/2019   GLUCOSE 108 (H) 01/27/2019   BUN 26 (H) 01/27/2019   CREATININE 1.20 (H) 01/27/2019   CALCIUM 9.4 01/27/2019   GFRNONAA 60 05/06/2017   GFRAA 70 05/06/2017    Assessment and Plan  hiv disease = well controlled. Continue on current regimen  Long term medication management = cr is stable. No need to change biktarvy  Health maintenance = flu vaccine in the fall through work. Will check if she has had colonoscopy

## 2019-03-12 MED FILL — BIKTARVY 50-200-25 MG TABS: 50-200-25 | 30 days supply | Qty: 30 | Fill #1

## 2019-04-20 MED FILL — BIKTARVY 50-200-25 MG TABS: 50-200-25 | 30 days supply | Qty: 30 | Fill #2

## 2019-04-29 ENCOUNTER — Encounter: Payer: Self-pay | Admitting: Gynecology

## 2019-05-18 MED FILL — BIKTARVY 50-200-25 MG TABS: 50-200-25 | 30 days supply | Qty: 30 | Fill #3

## 2019-05-25 DIAGNOSIS — L821 Other seborrheic keratosis: Secondary | ICD-10-CM | POA: Diagnosis not present

## 2019-05-25 DIAGNOSIS — L814 Other melanin hyperpigmentation: Secondary | ICD-10-CM | POA: Diagnosis not present

## 2019-05-25 DIAGNOSIS — D225 Melanocytic nevi of trunk: Secondary | ICD-10-CM | POA: Diagnosis not present

## 2019-05-25 DIAGNOSIS — K130 Diseases of lips: Secondary | ICD-10-CM | POA: Diagnosis not present

## 2019-05-25 DIAGNOSIS — Z23 Encounter for immunization: Secondary | ICD-10-CM | POA: Diagnosis not present

## 2019-06-01 MED FILL — TRIAMCINOLONE 0.1% OINTMENT: 0.1 | 14 days supply | Qty: 30 | Fill #0

## 2019-06-02 DIAGNOSIS — H5212 Myopia, left eye: Secondary | ICD-10-CM | POA: Diagnosis not present

## 2019-06-02 DIAGNOSIS — H524 Presbyopia: Secondary | ICD-10-CM | POA: Diagnosis not present

## 2019-06-02 DIAGNOSIS — H52222 Regular astigmatism, left eye: Secondary | ICD-10-CM | POA: Diagnosis not present

## 2019-06-15 MED FILL — BIKTARVY 50-200-25 MG TABS: 50-200-25 | 30 days supply | Qty: 30 | Fill #4

## 2019-06-15 MED FILL — LISINOPRIL 10 MG TABS: 10 | 90 days supply | Qty: 90 | Fill #1

## 2019-07-15 ENCOUNTER — Encounter: Payer: Self-pay | Admitting: Women's Health

## 2019-07-15 MED FILL — BIKTARVY 50-200-25 MG TABS: 50-200-25 | 30 days supply | Qty: 30 | Fill #5

## 2019-07-20 ENCOUNTER — Encounter: Payer: 59 | Admitting: Women's Health

## 2019-08-05 ENCOUNTER — Other Ambulatory Visit: Payer: Self-pay | Admitting: Internal Medicine

## 2019-08-17 ENCOUNTER — Other Ambulatory Visit: Payer: Self-pay

## 2019-08-18 ENCOUNTER — Ambulatory Visit (INDEPENDENT_AMBULATORY_CARE_PROVIDER_SITE_OTHER): Payer: 59 | Admitting: Women's Health

## 2019-08-18 ENCOUNTER — Encounter: Payer: Self-pay | Admitting: Women's Health

## 2019-08-18 VITALS — BP 118/78 | Ht 66.0 in | Wt 142.0 lb

## 2019-08-18 DIAGNOSIS — Z01419 Encounter for gynecological examination (general) (routine) without abnormal findings: Secondary | ICD-10-CM

## 2019-08-18 MED ORDER — ESTRADIOL 10 MCG VA TABS: ORAL_TABLET | VAGINAL | 4 refills | Status: DC

## 2019-08-18 MED FILL — ESTRADIOL 10 MCG TABS: 10 | 28 days supply | Qty: 18 | Fill #0

## 2019-08-18 MED FILL — BIKTARVY 50-200-25 MG TABS: 50-200-25 | 30 days supply | Qty: 30 | Fill #0

## 2019-08-18 NOTE — Patient Instructions (Addendum)
Good to see you today Vitamin D 2000 IUs daily Health Maintenance for Postmenopausal Women Menopause is a normal process in which your ability to get pregnant comes to an end. This process happens slowly over many months or years, usually between the ages of 26 and 78. Menopause is complete when you have missed your menstrual periods for 12 months. It is important to talk with your health care provider about some of the most common conditions that affect women after menopause (postmenopausal women). These include heart disease, cancer, and bone loss (osteoporosis). Adopting a healthy lifestyle and getting preventive care can help to promote your health and wellness. The actions you take can also lower your chances of developing some of these common conditions. What should I know about menopause? During menopause, you may get a number of symptoms, such as:  Hot flashes. These can be moderate or severe.  Night sweats.  Decrease in sex drive.  Mood swings.  Headaches.  Tiredness.  Irritability.  Memory problems.  Insomnia. Choosing to treat or not to treat these symptoms is a decision that you make with your health care provider. Do I need hormone replacement therapy?  Hormone replacement therapy is effective in treating symptoms that are caused by menopause, such as hot flashes and night sweats.  Hormone replacement carries certain risks, especially as you become older. If you are thinking about using estrogen or estrogen with progestin, discuss the benefits and risks with your health care provider. What is my risk for heart disease and stroke? The risk of heart disease, heart attack, and stroke increases as you age. One of the causes may be a change in the body's hormones during menopause. This can affect how your body uses dietary fats, triglycerides, and cholesterol. Heart attack and stroke are medical emergencies. There are many things that you can do to help prevent heart disease  and stroke. Watch your blood pressure  High blood pressure causes heart disease and increases the risk of stroke. This is more likely to develop in people who have high blood pressure readings, are of African descent, or are overweight.  Have your blood pressure checked: ? Every 3-5 years if you are 75-93 years of age. ? Every year if you are 80 years old or older. Eat a healthy diet   Eat a diet that includes plenty of vegetables, fruits, low-fat dairy products, and lean protein.  Do not eat a lot of foods that are high in solid fats, added sugars, or sodium. Get regular exercise Get regular exercise. This is one of the most important things you can do for your health. Most adults should:  Try to exercise for at least 150 minutes each week. The exercise should increase your heart rate and make you sweat (moderate-intensity exercise).  Try to do strengthening exercises at least twice each week. Do these in addition to the moderate-intensity exercise.  Spend less time sitting. Even light physical activity can be beneficial. Other tips  Work with your health care provider to achieve or maintain a healthy weight.  Do not use any products that contain nicotine or tobacco, such as cigarettes, e-cigarettes, and chewing tobacco. If you need help quitting, ask your health care provider.  Know your numbers. Ask your health care provider to check your cholesterol and your blood sugar (glucose). Continue to have your blood tested as directed by your health care provider. Do I need screening for cancer? Depending on your health history and family history, you may need to  have cancer screening at different stages of your life. This may include screening for:  Breast cancer.  Cervical cancer.  Lung cancer.  Colorectal cancer. What is my risk for osteoporosis? After menopause, you may be at increased risk for osteoporosis. Osteoporosis is a condition in which bone destruction happens more  quickly than new bone creation. To help prevent osteoporosis or the bone fractures that can happen because of osteoporosis, you may take the following actions:  If you are 39-75 years old, get at least 1,000 mg of calcium and at least 600 mg of vitamin D per day.  If you are older than age 32 but younger than age 61, get at least 1,200 mg of calcium and at least 600 mg of vitamin D per day.  If you are older than age 66, get at least 1,200 mg of calcium and at least 800 mg of vitamin D per day. Smoking and drinking excessive alcohol increase the risk of osteoporosis. Eat foods that are rich in calcium and vitamin D, and do weight-bearing exercises several times each week as directed by your health care provider. How does menopause affect my mental health? Depression may occur at any age, but it is more common as you become older. Common symptoms of depression include:  Low or sad mood.  Changes in sleep patterns.  Changes in appetite or eating patterns.  Feeling an overall lack of motivation or enjoyment of activities that you previously enjoyed.  Frequent crying spells. Talk with your health care provider if you think that you are experiencing depression. General instructions See your health care provider for regular wellness exams and vaccines. This may include:  Scheduling regular health, dental, and eye exams.  Getting and maintaining your vaccines. These include: ? Influenza vaccine. Get this vaccine each year before the flu season begins. ? Pneumonia vaccine. ? Shingles vaccine. ? Tetanus, diphtheria, and pertussis (Tdap) booster vaccine. Your health care provider may also recommend other immunizations. Tell your health care provider if you have ever been abused or do not feel safe at home. Summary  Menopause is a normal process in which your ability to get pregnant comes to an end.  This condition causes hot flashes, night sweats, decreased interest in sex, mood swings,  headaches, or lack of sleep.  Treatment for this condition may include hormone replacement therapy.  Take actions to keep yourself healthy, including exercising regularly, eating a healthy diet, watching your weight, and checking your blood pressure and blood sugar levels.  Get screened for cancer and depression. Make sure that you are up to date with all your vaccines. This information is not intended to replace advice given to you by your health care provider. Make sure you discuss any questions you have with your health care provider. Document Revised: 07/16/2018 Document Reviewed: 07/16/2018 Elsevier Patient Education  2020 [employer].  Menopause and Hormone Replacement Therapy Menopause is a normal time of life when menstrual periods stop completely and the ovaries stop producing the female hormones estrogen and progesterone. This lack of hormones can affect your health and cause undesirable symptoms. Hormone replacement therapy (HRT) can relieve some of those symptoms. What is hormone replacement therapy? HRT is the use of artificial (synthetic) hormones to replace hormones that your body has stopped producing because you have reached menopause. What are my options for HRT?  HRT may consist of the synthetic hormones estrogen and progestin, or it may consist of only estrogen (estrogen-only therapy). You and your health care provider  will decide which form of HRT is best for you. If you choose to be on HRT and you have a uterus, estrogen and progestin are usually prescribed. Estrogen-only therapy is used for women who do not have a uterus. Possible options for taking HRT include:  Pills.  Patches.  Gels.  Sprays.  Vaginal cream.  Vaginal rings.  Vaginal inserts. The amount of hormone(s) that you take and how long you take the hormone(s) varies according to your health. It is important to:  Begin HRT with the lowest possible dosage.  Stop HRT as soon as your health care  provider tells you to stop.  Work with your health care provider so that you feel informed and comfortable with your decisions. What are the benefits of HRT? HRT can reduce the frequency and severity of menopausal symptoms. Benefits of HRT vary according to the kind of symptoms that you have, how severe they are, and your overall health. HRT may help to improve the following symptoms of menopause:  Hot flashes and night sweats. These are sudden feelings of heat that spread over the face and body. The skin may turn red, like a blush. Night sweats are hot flashes that happen while you are sleeping or trying to sleep.  Bone loss (osteoporosis). The body loses calcium more quickly after menopause, causing the bones to become weaker. This can increase the risk for bone breaks (fractures).  Vaginal dryness. The lining of the vagina can become thin and dry, which can cause pain during sex or cause infection, burning, or itching.  Urinary tract infections.  Urinary incontinence. This is the inability to control when you pass urine.  Irritability.  Short-term memory problems. What are the risks of HRT? Risks of HRT vary depending on your individual health and medical history. Risks of HRT also depend on whether you receive both estrogen and progestin or you receive estrogen only. HRT may increase the risk of:  Spotting. This is when a small amount of blood leaks from the vagina unexpectedly.  Endometrial cancer. This cancer is in the lining of the uterus (endometrium).  Breast cancer.  Increased density of breast tissue. This can make it harder to find breast cancer on a breast X-ray (mammogram).  Stroke.  Heart disease.  Blood clots.  Gallbladder disease.  Liver disease. Risks of HRT can increase if you have any of the following conditions:  Endometrial cancer.  Liver disease.  Heart disease.  Breast cancer.  History of blood clots.  History of stroke. Follow these  instructions at home:  Take over-the-counter and prescription medicines only as told by your health care provider.  Get mammograms, pelvic exams, and medical checkups as often as told by your health care provider.  Have Pap tests done as often as told by your health care provider. A Pap test is sometimes called a Pap smear. It is a screening test that is used to check for signs of cancer of the cervix and vagina. A Pap test can also identify the presence of infection or precancerous changes. Pap tests may be done: ? Every 3 years, starting at age 8. ? Every 5 years, starting after age 50, in combination with testing for human papillomavirus (HPV). ? More often or less often depending on other medical conditions you have, your age, and other risk factors.  It is up to you to get the results of your Pap test. Ask your health care provider, or the department that is doing the test, when your  results will be ready.  Keep all follow-up visits as told by your health care provider. This is important. Contact a health care provider if you have:  Pain or swelling in your legs.  Shortness of breath.  Chest pain.  Lumps or changes in your breasts or armpits.  Slurred speech.  Pain, burning, or bleeding when you urinate.  Unusual vaginal bleeding.  Dizziness or headaches.  Weakness or numbness in any part of your arms or legs.  Pain in your abdomen. Summary  Menopause is a normal time of life when menstrual periods stop completely and the ovaries stop producing the female hormones estrogen and progesterone.  Hormone replacement therapy (HRT) can relieve some of the symptoms of menopause.  HRT can reduce the frequency and severity of menopausal symptoms.  Risks of HRT vary depending on your individual health and medical history. This information is not intended to replace advice given to you by your health care provider. Make sure you discuss any questions you have with your health  care provider. Document Revised: 03/25/2018 Document Reviewed: 03/25/2018 Elsevier Patient Education  2020 [employer].

## 2019-08-18 NOTE — Progress Notes (Signed)
Cynthia [name] [birth year] [id number]    History:    Presents for annual exam.  Cycles every 4 to 6 months, hot flushes with vaginal dryness.  No spotting.  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  Normal mammogram history.    Primary care manages hypertension.  2000+ HIV +0 viral load, husband HIV negative.  03/2018 1 benign colon polyp 5-year follow-up.  Past medical history, past surgical history, family history and social history were all reviewed and documented in the EPIC chart.  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  ROS:  A ROS was performed and pertinent positives and negatives are included.  Exam:  Vitals:   08/18/19 1157  BP: 118/78  Weight: 142 lb (64.4 kg)  Height: 5\' 6"  (1.676 m)   Body mass index is 22.92 kg/m.   General appearance:  Normal Thyroid:  Symmetrical, normal in size, without palpable masses or nodularity. Respiratory  Auscultation:  Clear without wheezing or rhonchi Cardiovascular  Auscultation:  Regular rate, without rubs, murmurs or gallops  Edema/varicosities:  Not grossly evident Abdominal  Soft,nontender, without masses, guarding or rebound.  Liver/spleen:  No organomegaly noted  Hernia:  None appreciated  Skin  Inspection:  Grossly normal   Breasts: Examined lying and sitting.     Right: Without masses, retractions, discharge or axillary adenopathy.     Left: Without masses, retractions, discharge or axillary adenopathy. Gentitourinary   Inguinal/mons:  Normal without inguinal adenopathy  External genitalia:  Normal  BUS/Urethra/Skene's glands:  Normal  Vagina:  Normal  Cervix:  Normal  Uterus:  normal in size, shape and contour.  Midline and mobile  Adnexa/parametria:     Rt: Without masses or tenderness.   Lt: Without masses or tenderness.  Anus and perineum: Normal  Digital rectal exam: Normal sphincter tone without palpated masses or tenderness  Assessment/Plan:  54 y.o. MWF G4, P1 for  annual exam with complaint of vaginal dryness and menopausal symptoms.  Perimenopausal/condoms Positive HIV  Dr Baxter Flattery manages Hypertension-primary care manages labs and meds 1995 CIN-1 LEEP normal Paps after  Plan: CMP per request for primary care appointment next week, Pap.  Screening guidelines reviewed.  History of negative HR HPV.  HRT reviewed, declines, Vagifem for dryness discussed prescription, proper use, reviewed minimal systemic absorption.  Instructed to call if menopausal symptoms become problematic.  SBEs, continue annual screening mammogram, calcium rich foods, vitamin D 2000 daily encouraged.  Reviewed importance of weightbearing and balance type exercise.  Shingrix vaccine and menopause discussed   [facility], 1:47 PM 08/18/2019

## 2019-08-19 LAB — COMPREHENSIVE METABOLIC PANEL
AG Ratio: 1.6 (calc) (ref 1.0–2.5)
ALT: 19 U/L (ref 6–29)
AST: 19 U/L (ref 10–35)
Albumin: 4.4 g/dL (ref 3.6–5.1)
Alkaline phosphatase (APISO): 49 U/L (ref 37–153)
BUN: 22 mg/dL (ref 7–25)
CO2: 24 mmol/L (ref 20–32)
Calcium: 9.5 mg/dL (ref 8.6–10.4)
Chloride: 106 mmol/L (ref 98–110)
Creat: 1.02 mg/dL (ref 0.50–1.05)
Globulin: 2.7 g/dL (calc) (ref 1.9–3.7)
Glucose, Bld: 76 mg/dL (ref 65–99)
Potassium: 4.1 mmol/L (ref 3.5–5.3)
Sodium: 140 mmol/L (ref 135–146)
Total Bilirubin: 0.4 mg/dL (ref 0.2–1.2)
Total Protein: 7.1 g/dL (ref 6.1–8.1)

## 2019-08-19 LAB — PAP IG W/ RFLX HPV ASCU

## 2019-09-16 ENCOUNTER — Other Ambulatory Visit: Payer: Self-pay | Admitting: Internal Medicine

## 2019-09-16 DIAGNOSIS — I10 Essential (primary) hypertension: Secondary | ICD-10-CM

## 2019-09-16 MED FILL — BIKTARVY 50-200-25 MG TABS: 50-200-25 | 30 days supply | Qty: 30 | Fill #1

## 2019-09-16 MED FILL — LISINOPRIL 10 MG TABS: 10 | 90 days supply | Qty: 90 | Fill #0

## 2019-10-15 MED FILL — BIKTARVY 50-200-25 MG TABS: 50-200-25 | 30 days supply | Qty: 30 | Fill #2

## 2019-11-16 MED FILL — BIKTARVY 50-200-25 MG TABS: 50-200-25 | 30 days supply | Qty: 30 | Fill #3

## 2019-12-11 MED FILL — BIKTARVY 50-200-25 MG TABS: 50-200-25 | 30 days supply | Qty: 30 | Fill #4

## 2019-12-11 MED FILL — LISINOPRIL 10 MG TABS: 10 | 90 days supply | Qty: 90 | Fill #1

## 2019-12-24 DIAGNOSIS — Z21 Asymptomatic human immunodeficiency virus [HIV] infection status: Secondary | ICD-10-CM | POA: Diagnosis not present

## 2019-12-24 DIAGNOSIS — Z961 Presence of intraocular lens: Secondary | ICD-10-CM | POA: Diagnosis not present

## 2019-12-24 DIAGNOSIS — H33021 Retinal detachment with multiple breaks, right eye: Secondary | ICD-10-CM | POA: Diagnosis not present

## 2020-01-11 ENCOUNTER — Other Ambulatory Visit: Payer: 59

## 2020-01-12 ENCOUNTER — Other Ambulatory Visit: Payer: Self-pay

## 2020-01-12 ENCOUNTER — Other Ambulatory Visit: Payer: 59

## 2020-01-12 DIAGNOSIS — Z113 Encounter for screening for infections with a predominantly sexual mode of transmission: Secondary | ICD-10-CM

## 2020-01-12 DIAGNOSIS — Z79899 Other long term (current) drug therapy: Secondary | ICD-10-CM | POA: Diagnosis not present

## 2020-01-12 DIAGNOSIS — B20 Human immunodeficiency virus [HIV] disease: Secondary | ICD-10-CM | POA: Diagnosis not present

## 2020-01-13 LAB — COMPREHENSIVE METABOLIC PANEL
AG Ratio: 1.6 (calc) (ref 1.0–2.5)
ALT: 12 U/L (ref 6–29)
AST: 17 U/L (ref 10–35)
Albumin: 4.4 g/dL (ref 3.6–5.1)
Alkaline phosphatase (APISO): 50 U/L (ref 37–153)
BUN/Creatinine Ratio: 17 (calc) (ref 6–22)
BUN: 21 mg/dL (ref 7–25)
CO2: 24 mmol/L (ref 20–32)
Calcium: 9.6 mg/dL (ref 8.6–10.4)
Chloride: 105 mmol/L (ref 98–110)
Creat: 1.21 mg/dL — ABNORMAL HIGH (ref 0.50–1.05)
Globulin: 2.7 g/dL (calc) (ref 1.9–3.7)
Glucose, Bld: 122 mg/dL — ABNORMAL HIGH (ref 65–99)
Potassium: 4.8 mmol/L (ref 3.5–5.3)
Sodium: 138 mmol/L (ref 135–146)
Total Bilirubin: 0.5 mg/dL (ref 0.2–1.2)
Total Protein: 7.1 g/dL (ref 6.1–8.1)

## 2020-01-13 LAB — CBC WITH DIFFERENTIAL/PLATELET
Absolute Monocytes: 353 cells/uL (ref 200–950)
Basophils Absolute: 22 cells/uL (ref 0–200)
Basophils Relative: 0.5 %
Eosinophils Absolute: 172 cells/uL (ref 15–500)
Eosinophils Relative: 4 %
HCT: 36.3 % (ref 35.0–45.0)
Hemoglobin: 12.4 g/dL (ref 11.7–15.5)
Lymphs Abs: 1475 cells/uL (ref 850–3900)
MCH: 31.9 pg (ref 27.0–33.0)
MCHC: 34.2 g/dL (ref 32.0–36.0)
MCV: 93.3 fL (ref 80.0–100.0)
MPV: 9.6 fL (ref 7.5–12.5)
Monocytes Relative: 8.2 %
Neutro Abs: 2279 cells/uL (ref 1500–7800)
Neutrophils Relative %: 53 %
Platelets: 205 10*3/uL (ref 140–400)
RBC: 3.89 10*6/uL (ref 3.80–5.10)
RDW: 12 % (ref 11.0–15.0)
Total Lymphocyte: 34.3 %
WBC: 4.3 10*3/uL (ref 3.8–10.8)

## 2020-01-13 LAB — LIPID PANEL
Cholesterol: 225 mg/dL — ABNORMAL HIGH (ref <200)
HDL: 45 mg/dL — ABNORMAL LOW (ref >=50)
LDL Cholesterol (Calc): 155 mg/dL (calc) — ABNORMAL HIGH
Non-HDL Cholesterol (Calc): 180 mg/dL (calc) — ABNORMAL HIGH (ref <130)
Total CHOL/HDL Ratio: 5 (calc) — ABNORMAL HIGH (ref <5.0)
Triglycerides: 129 mg/dL (ref <150)

## 2020-01-13 LAB — HIV-1 RNA QUANT-NO REFLEX-BLD
HIV 1 RNA Quant: <20 copies/mL
HIV-1 RNA Quant, Log: <1.3 Log copies/mL

## 2020-01-13 LAB — T-HELPER CELL (CD4) - (RCID CLINIC ONLY)
CD4 % Helper T Cell: 46 % (ref 33–65)
CD4 T Cell Abs: 698 /uL (ref 400–1790)

## 2020-01-13 LAB — RPR: RPR Ser Ql: NONREACTIVE

## 2020-01-25 ENCOUNTER — Other Ambulatory Visit: Payer: Self-pay | Admitting: Internal Medicine

## 2020-01-25 ENCOUNTER — Other Ambulatory Visit: Payer: Self-pay

## 2020-01-25 ENCOUNTER — Ambulatory Visit: Payer: 59 | Admitting: Internal Medicine

## 2020-01-25 ENCOUNTER — Encounter: Payer: Self-pay | Admitting: Internal Medicine

## 2020-01-25 VITALS — BP 109/74 | HR 73 | Temp 98.0°F | Wt 139.0 lb

## 2020-01-25 DIAGNOSIS — B20 Human immunodeficiency virus [HIV] disease: Secondary | ICD-10-CM | POA: Diagnosis not present

## 2020-01-25 DIAGNOSIS — Z79899 Other long term (current) drug therapy: Secondary | ICD-10-CM

## 2020-01-25 DIAGNOSIS — R438 Other disturbances of smell and taste: Secondary | ICD-10-CM

## 2020-01-25 DIAGNOSIS — E785 Hyperlipidemia, unspecified: Secondary | ICD-10-CM | POA: Diagnosis not present

## 2020-01-25 MED ORDER — ROSUVASTATIN CALCIUM 10 MG PO TABS
10.0000 mg | ORAL_TABLET | Freq: Every day | ORAL | 11 refills | Status: DC
Start: 2020-01-25 — End: 2020-01-25

## 2020-01-25 MED FILL — ROSUVASTATIN CALCIUM 10 MG: 10 | 30 days supply | Qty: 30 | Fill #0

## 2020-01-25 NOTE — Progress Notes (Signed)
Patient ID: Cynthia [name], female   DOB: Aug 16, 1965, 54 y.o.   MRN: [id number]  HPI  Cynthia [name] is a 54yo F with well controlled hiv disease,and htn. She takes biktarvy daily without missing doses. In reviewing labs, also mildly elevated LDL. Has had steady diet over the years. She reports weight gain over the last year in part covid restrictions "covid weight" and change in metabolism from menopause. She did have a case of Mild covid in december, still doesn't have full range of smell back, and concentration "brain fog". No recent illnesses. Work remains stressful for her.  Outpatient Encounter Medications as of 01/25/2020  Medication Sig  . BIKTARVY 50-200-25 MG TABS tablet TAKE 1 TABLET BY MOUTH DAILY. *NEED OFFICE VISIT*  . cetirizine (ZYRTEC) 10 MG tablet Take 10 mg by mouth daily.  . Cholecalciferol (VITAMIN D) 2000 UNITS tablet Take 2,000 Units by mouth daily.    Marland Kitchen lisinopril (ZESTRIL) 10 MG tablet TAKE 1 TABLET BY MOUTH DAILY  . Estradiol 10 MCG TABS vaginal tablet Place in vagina daily for 2 weeks and then twice weekly   Facility-Administered Encounter Medications as of 01/25/2020  Medication  . 0.9 %  sodium chloride infusion     Patient Active Problem List   Diagnosis Date Noted  . 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 Hypertension   . HIV DISEASE 11/07/2006  . DISEASE, HYPERTENSIVE HEART, BENIGN, W/HF 11/07/2006     Health Maintenance Due  Topic Date Due  . COVID-19 Vaccine (1) Never done  . PAP SMEAR-Modifier  07/17/2019     Review of Systems Review of Systems  Constitutional: Negative for fever, chills, diaphoresis, activity change, appetite change, fatigue and +unintentional unexpected weight change.  HENT: Negative for congestion, sore throat, rhinorrhea, sneezing, trouble swallowing and sinus pressure.  Eyes: Negative for photophobia and visual disturbance.  Respiratory: Negative for cough, chest tightness, shortness of breath, wheezing and stridor.  Cardiovascular: Negative  for chest pain, palpitations and leg swelling.  Gastrointestinal: Negative for nausea, vomiting, abdominal pain, diarrhea, constipation, blood in stool, abdominal distention and anal bleeding.  Genitourinary: Negative for dysuria, hematuria, flank pain and difficulty urinating.  Musculoskeletal: Negative for myalgias, back pain, joint swelling, arthralgias and gait problem.  Skin: Negative for color change, pallor, rash and wound.  Neurological: Negative for dizziness, tremors, weakness and light-headedness. +sweet smell impairment from covid Hematological: Negative for adenopathy. Does not bruise/bleed easily.  Psychiatric/Behavioral: Negative for behavioral problems, confusion, sleep disturbance, dysphoric mood, decreased concentration and agitation.    Physical Exam   BP 109/74   Pulse 73   Temp 98 F (36.7 C)   Wt 139 lb (63 kg)   BMI 22.44 kg/m   Physical Exam  Constitutional:  oriented to person, place, and time. appears well-developed and well-nourished. No distress.  HENT: [place]/AT, PERRLA, no scleral icterus Mouth/Throat: Oropharynx is clear and moist. No oropharyngeal exudate.  Cardiovascular: Normal rate, regular rhythm and normal heart sounds. Exam reveals no gallop and no friction rub.  No murmur heard.  Pulmonary/Chest: Effort normal and breath sounds normal. No respiratory distress.  has no wheezes.  Neck = supple, no nuchal rigidity, bilateral fullness but no lymphadenopathy Lymphadenopathy: no cervical adenopathy. No axillary adenopathy Neurological: alert and oriented to person, place, and time.  Skin: Skin is warm and dry. No rash noted. No erythema.  Psychiatric: a normal mood and affect.  behavior is normal.   Lab Results  Component Value Date   CD4TCELL 46 01/12/2020  Lab Results  Component Value Date   CD4TABS 698 01/12/2020   CD4TABS 785 01/27/2019   CD4TABS 720 05/06/2017   Lab Results  Component Value Date   HIV1RNAQUANT <20 NOT DETECTED 01/12/2020     Lab Results  Component Value Date   HEPBSAB No 09/30/2006   Lab Results  Component Value Date   LABRPR NON-REACTIVE 01/12/2020    CBC Lab Results  Component Value Date   WBC 4.3 01/12/2020   RBC 3.89 01/12/2020   HGB 12.4 01/12/2020   HCT 36.3 01/12/2020   PLT 205 01/12/2020   MCV 93.3 01/12/2020   MCH 31.9 01/12/2020   MCHC 34.2 01/12/2020   RDW 12.0 01/12/2020   LYMPHSABS 1,475 01/12/2020   MONOABS 352 02/29/2016   EOSABS 172 01/12/2020    BMET Lab Results  Component Value Date   NA 138 01/12/2020   K 4.8 01/12/2020   CL 105 01/12/2020   CO2 24 01/12/2020   GLUCOSE 122 (H) 01/12/2020   BUN 21 01/12/2020   CREATININE 1.21 (H) 01/12/2020   CALCIUM 9.6 01/12/2020   GFRNONAA 60 05/06/2017   GFRAA 70 05/06/2017      Assessment and Plan  HIV disease= well controlled. 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 Will check ua to make cr is not proteinuria  Hyperlipidemia =Prescribe crestor 10mg  daily  Health maintenance = plan to get mammo this summer

## 2020-01-26 ENCOUNTER — Other Ambulatory Visit: Payer: Self-pay

## 2020-01-26 ENCOUNTER — Other Ambulatory Visit: Payer: Self-pay | Admitting: Internal Medicine

## 2020-01-26 ENCOUNTER — Other Ambulatory Visit: Payer: Self-pay | Admitting: Women's Health

## 2020-01-26 DIAGNOSIS — Z1231 Encounter for screening mammogram for malignant neoplasm of breast: Secondary | ICD-10-CM

## 2020-01-26 LAB — URINALYSIS
Bilirubin Urine: NEGATIVE
Glucose, UA: NEGATIVE
Hgb urine dipstick: NEGATIVE
Ketones, ur: NEGATIVE
Leukocytes,Ua: NEGATIVE
Nitrite: NEGATIVE
Protein, ur: NEGATIVE
Specific Gravity, Urine: 1.006 (ref 1.001–1.03)
pH: 6.5 (ref 5.0–8.0)

## 2020-01-27 ENCOUNTER — Other Ambulatory Visit: Payer: Self-pay | Admitting: Nurse Practitioner

## 2020-01-27 DIAGNOSIS — Z1231 Encounter for screening mammogram for malignant neoplasm of breast: Secondary | ICD-10-CM

## 2020-02-01 ENCOUNTER — Other Ambulatory Visit: Payer: Self-pay | Admitting: Internal Medicine

## 2020-02-02 ENCOUNTER — Other Ambulatory Visit: Payer: Self-pay

## 2020-02-02 ENCOUNTER — Ambulatory Visit
Admission: RE | Admit: 2020-02-02 | Discharge: 2020-02-02 | Disposition: A | Discharge status: Home / Self Care | Payer: 59 | Source: Ambulatory Visit

## 2020-02-02 DIAGNOSIS — Z1231 Encounter for screening mammogram for malignant neoplasm of breast: Secondary | ICD-10-CM

## 2020-02-15 MED FILL — BIKTARVY 50-200-25 MG TABS: 50-200-25 | 30 days supply | Qty: 30 | Fill #0

## 2020-02-25 MED FILL — ROSUVASTATIN CALCIUM 10 MG: 10 | 30 days supply | Qty: 30 | Fill #1

## 2020-03-15 MED FILL — BIKTARVY 50-200-25 MG TABS: 50-200-25 | 30 days supply | Qty: 30 | Fill #1

## 2020-03-28 MED FILL — ROSUVASTATIN CALCIUM 10 MG: 10 | 30 days supply | Qty: 30 | Fill #2

## 2020-04-12 ENCOUNTER — Other Ambulatory Visit: Payer: Self-pay | Admitting: Internal Medicine

## 2020-04-12 DIAGNOSIS — I10 Essential (primary) hypertension: Secondary | ICD-10-CM

## 2020-04-12 MED FILL — LISINOPRIL 10 MG TABS: 10 | 90 days supply | Qty: 90 | Fill #0

## 2020-04-20 MED FILL — BIKTARVY 50-200-25 MG TABS: 50-200-25 | 30 days supply | Qty: 30 | Fill #2

## 2020-04-25 MED FILL — ROSUVASTATIN CALCIUM 10 MG: 10 | 90 days supply | Qty: 90 | Fill #3

## 2020-05-18 MED FILL — BIKTARVY 50-200-25 MG TABS: 50-200-25 | 30 days supply | Qty: 30 | Fill #3

## 2020-05-30 DIAGNOSIS — L814 Other melanin hyperpigmentation: Secondary | ICD-10-CM | POA: Diagnosis not present

## 2020-05-30 DIAGNOSIS — D225 Melanocytic nevi of trunk: Secondary | ICD-10-CM | POA: Diagnosis not present

## 2020-05-30 DIAGNOSIS — L570 Actinic keratosis: Secondary | ICD-10-CM | POA: Diagnosis not present

## 2020-05-30 DIAGNOSIS — L821 Other seborrheic keratosis: Secondary | ICD-10-CM | POA: Diagnosis not present

## 2020-06-17 MED FILL — BIKTARVY 50-200-25 MG TABS: 50-200-25 | 30 days supply | Qty: 30 | Fill #4

## 2020-07-15 MED FILL — BIKTARVY 50-200-25 MG TABS: 50-200-25 | 30 days supply | Qty: 30 | Fill #5

## 2020-07-25 MED FILL — ROSUVASTATIN CALCIUM 10 MG: 10 | 90 days supply | Qty: 90 | Fill #4

## 2020-07-25 MED FILL — LISINOPRIL 10 MG TABS: 10 | 90 days supply | Qty: 90 | Fill #1

## 2020-07-27 ENCOUNTER — Ambulatory Visit: Payer: 59 | Admitting: Internal Medicine

## 2020-07-27 ENCOUNTER — Other Ambulatory Visit: Payer: Self-pay

## 2020-07-27 ENCOUNTER — Encounter: Payer: Self-pay | Admitting: Internal Medicine

## 2020-07-27 ENCOUNTER — Other Ambulatory Visit (HOSPITAL_COMMUNITY)
Admission: RE | Admit: 2020-07-27 | Discharge: 2020-07-27 | Disposition: A | Discharge status: Home / Self Care | Payer: 59 | Source: Ambulatory Visit | Attending: Internal Medicine | Admitting: Internal Medicine

## 2020-07-27 DIAGNOSIS — B20 Human immunodeficiency virus [HIV] disease: Secondary | ICD-10-CM

## 2020-07-27 DIAGNOSIS — Z113 Encounter for screening for infections with a predominantly sexual mode of transmission: Secondary | ICD-10-CM | POA: Insufficient documentation

## 2020-07-27 NOTE — Progress Notes (Signed)
RFV: follow up for hiv disease  Patient ID: Cynthia [name], female   DOB: 09/25/1965, 54 y.o.   MRN: [id number]  HPI 54yo F with htn, hld, well controlled hiv disease, CD  Count 698/VL<02 (June 2021) on biktarvy. 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  covid pfizer 10/02/19 EN [id number] covid pfizer 10/23/19 [id number] covid pfizer 05/27/20 [id number]   Outpatient Encounter Medications as of 07/27/2020  Medication Sig  . BIKTARVY 50-200-25 MG TABS tablet TAKE 1 TABLET BY MOUTH DAILY. *NEED OFFICE VISIT*  . cetirizine (ZYRTEC) 10 MG tablet Take 10 mg by mouth daily.  . Cholecalciferol (VITAMIN D) 2000 UNITS tablet Take 2,000 Units by mouth daily.  Marland Kitchen lisinopril (ZESTRIL) 10 MG tablet TAKE 1 TABLET BY MOUTH DAILY  . rosuvastatin (CRESTOR) 10 MG tablet Take 1 tablet (10 mg total) by mouth daily.  . Estradiol 10 MCG TABS vaginal tablet Place in vagina daily for 2 weeks and then twice weekly   Facility-Administered Encounter Medications as of 07/27/2020  Medication  . 0.9 %  sodium chloride infusion     Patient Active Problem List   Diagnosis Date Noted  . 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 Hypertension   . HIV DISEASE 11/07/2006  . DISEASE, HYPERTENSIVE HEART, BENIGN, W/HF 11/07/2006     Health Maintenance Due  Topic Date Due  . COVID-19 Vaccine (1) Never done  . PAP SMEAR-Modifier  07/17/2019  . INFLUENZA VACCINE  03/06/2020     Review of Systems Constitutional: Negative for fever, chills, diaphoresis, activity change, appetite change, fatigue and + unexpected weight gain. HENT: Negative for congestion, sore throat, rhinorrhea, sneezing, trouble swallowing and sinus pressure.  Eyes: Negative for photophobia and visual disturbance.  Respiratory: Negative for cough, chest tightness, shortness of breath, wheezing and stridor.  Cardiovascular: Negative for chest pain, palpitations and leg swelling.   Gastrointestinal: Negative for nausea, vomiting, abdominal pain, diarrhea, constipation, blood in stool, abdominal distention and anal bleeding.  Genitourinary: Negative for dysuria, hematuria, flank pain and difficulty urinating.  Musculoskeletal: Negative for myalgias, back pain, joint swelling, arthralgias and gait problem.  Skin: Negative for color change, pallor, rash and wound.  Neurological: Negative for dizziness, tremors, weakness and light-headedness.  Hematological: Negative for adenopathy. Does not bruise/bleed easily.  Psychiatric/Behavioral: Negative for behavioral problems, confusion, sleep disturbance, dysphoric mood, decreased concentration and agitation.    Physical Exam   BP 120/77   Pulse 75   Temp 98 F (36.7 C)   Ht 5\' 5"  (1.651 m)   Wt 143 lb (64.9 kg)   BMI 23.80 kg/m   Physical Exam  Constitutional:  oriented to person, place, and time. appears well-developed and well-nourished. No distress.  HENT: [place]/AT, PERRLA, no scleral icterus Mouth/Throat: Oropharynx is clear and moist. No oropharyngeal exudate.  Cardiovascular: Normal rate, regular rhythm and normal heart sounds. Exam reveals no gallop and no friction rub.  No murmur heard.  Pulmonary/Chest: Effort normal and breath sounds normal. No respiratory distress.  has no wheezes.  Neck = supple, no nuchal rigidity Lymphadenopathy: no cervical adenopathy. No axillary adenopathy Neurological: alert and oriented to person, place, and time.  Skin: Skin is warm and dry. No rash noted. No erythema.  Psychiatric: a normal mood and affect.  behavior is normal.   Lab Results  Component Value Date   CD4TCELL 46 01/12/2020   Lab Results  Component Value Date  CD4TABS 698 01/12/2020   CD4TABS 785 01/27/2019   CD4TABS 720 05/06/2017   Lab Results  Component Value Date   HIV1RNAQUANT <20 NOT DETECTED 01/12/2020   Lab Results  Component Value Date   HEPBSAB No 09/30/2006   Lab Results  Component Value  Date   LABRPR NON-REACTIVE 01/12/2020    CBC Lab Results  Component Value Date   WBC 4.3 01/12/2020   RBC 3.89 01/12/2020   HGB 12.4 01/12/2020   HCT 36.3 01/12/2020   PLT 205 01/12/2020   MCV 93.3 01/12/2020   MCH 31.9 01/12/2020   MCHC 34.2 01/12/2020   RDW 12.0 01/12/2020   LYMPHSABS 1,475 01/12/2020   MONOABS 352 02/29/2016   EOSABS 172 01/12/2020    BMET Lab Results  Component Value Date   NA 138 01/12/2020   K 4.8 01/12/2020   CL 105 01/12/2020   CO2 24 01/12/2020   GLUCOSE 122 (H) 01/12/2020   BUN 21 01/12/2020   CREATININE 1.21 (H) 01/12/2020   CALCIUM 9.6 01/12/2020   GFRNONAA 60 05/06/2017   GFRAA 70 05/06/2017      Assessment and Plan  hiv disease = well controlled. Plan to check labs 6 months today.  Continue biktarvy  Long term medication management = cr is stable, in the past. Will check Cr to ensure still at range.  Slight weight gain = multifactorial - patient planning diet/exercise in the coming year.   Health maintenance = will do annual lab screening

## 2020-07-28 LAB — T-HELPER CELL (CD4) - (RCID CLINIC ONLY)
CD4 % Helper T Cell: 48 % (ref 33–65)
CD4 T Cell Abs: 601 /uL (ref 400–1790)

## 2020-07-28 LAB — URINE CYTOLOGY ANCILLARY ONLY
Chlamydia: NEGATIVE
Comment: NEGATIVE
Comment: NORMAL
Neisseria Gonorrhea: NEGATIVE

## 2020-08-09 LAB — CBC WITH DIFFERENTIAL/PLATELET
Absolute Monocytes: 311 cells/uL (ref 200–950)
Basophils Absolute: 29 cells/uL (ref 0–200)
Basophils Relative: 0.7 %
Eosinophils Absolute: 172 cells/uL (ref 15–500)
Eosinophils Relative: 4.1 %
HCT: 36.9 % (ref 35.0–45.0)
Hemoglobin: 12.6 g/dL (ref 11.7–15.5)
Lymphs Abs: 1252 cells/uL (ref 850–3900)
MCH: 32.4 pg (ref 27.0–33.0)
MCHC: 34.1 g/dL (ref 32.0–36.0)
MCV: 94.9 fL (ref 80.0–100.0)
MPV: 9.8 fL (ref 7.5–12.5)
Monocytes Relative: 7.4 %
Neutro Abs: 2436 cells/uL (ref 1500–7800)
Neutrophils Relative %: 58 %
Platelets: 196 10*3/uL (ref 140–400)
RBC: 3.89 10*6/uL (ref 3.80–5.10)
RDW: 11.5 % (ref 11.0–15.0)
Total Lymphocyte: 29.8 %
WBC: 4.2 10*3/uL (ref 3.8–10.8)

## 2020-08-09 LAB — COMPLETE METABOLIC PANEL WITH GFR
AG Ratio: 1.7 (calc) (ref 1.0–2.5)
ALT: 32 U/L — ABNORMAL HIGH (ref 6–29)
AST: 29 U/L (ref 10–35)
Albumin: 4.4 g/dL (ref 3.6–5.1)
Alkaline phosphatase (APISO): 52 U/L (ref 37–153)
BUN/Creatinine Ratio: 16 (calc) (ref 6–22)
BUN: 19 mg/dL (ref 7–25)
CO2: 27 mmol/L (ref 20–32)
Calcium: 9.4 mg/dL (ref 8.6–10.4)
Chloride: 105 mmol/L (ref 98–110)
Creat: 1.17 mg/dL — ABNORMAL HIGH (ref 0.50–1.05)
GFR, Est African American: 62 mL/min/{1.73_m2} (ref >=60)
GFR, Est Non African American: 54 mL/min/{1.73_m2} — ABNORMAL LOW (ref >=60)
Globulin: 2.6 g/dL (calc) (ref 1.9–3.7)
Glucose, Bld: 103 mg/dL — ABNORMAL HIGH (ref 65–99)
Potassium: 4.4 mmol/L (ref 3.5–5.3)
Sodium: 138 mmol/L (ref 135–146)
Total Bilirubin: 0.4 mg/dL (ref 0.2–1.2)
Total Protein: 7 g/dL (ref 6.1–8.1)

## 2020-08-09 LAB — RPR: RPR Ser Ql: NONREACTIVE

## 2020-08-09 LAB — HIV-1 RNA QUANT-NO REFLEX-BLD
HIV 1 RNA Quant: <20 Copies/mL
HIV-1 RNA Quant, Log: <1.3 Log cps/mL

## 2020-08-10 ENCOUNTER — Other Ambulatory Visit: Payer: Self-pay | Admitting: Internal Medicine

## 2020-08-15 MED FILL — BIKTARVY 50-200-25 MG TABS: 50-200-25 | 30 days supply | Qty: 30 | Fill #0

## 2020-08-22 ENCOUNTER — Encounter: Payer: 59 | Admitting: Nurse Practitioner

## 2020-08-29 ENCOUNTER — Ambulatory Visit (INDEPENDENT_AMBULATORY_CARE_PROVIDER_SITE_OTHER): Payer: 59 | Admitting: Nurse Practitioner

## 2020-08-29 ENCOUNTER — Other Ambulatory Visit: Payer: Self-pay

## 2020-08-29 ENCOUNTER — Encounter: Payer: Self-pay | Admitting: Nurse Practitioner

## 2020-08-29 VITALS — BP 122/80 | Ht 65.0 in | Wt 141.0 lb

## 2020-08-29 DIAGNOSIS — Z01419 Encounter for gynecological examination (general) (routine) without abnormal findings: Secondary | ICD-10-CM | POA: Diagnosis not present

## 2020-08-29 NOTE — Patient Instructions (Signed)
Health Maintenance, Female Adopting a healthy lifestyle and getting preventive care are important in promoting health and wellness. Ask your health care provider about:  The right schedule for you to have regular tests and exams.  Things you can do on your own to prevent diseases and keep yourself healthy. What should I know about diet, weight, and exercise? Eat a healthy diet  Eat a diet that includes plenty of vegetables, fruits, low-fat dairy products, and lean protein.  Do not eat a lot of foods that are high in solid fats, added sugars, or sodium.   Maintain a healthy weight Body mass index (BMI) is used to identify weight problems. It estimates body fat based on height and weight. Your health care provider can help determine your BMI and help you achieve or maintain a healthy weight. Get regular exercise Get regular exercise. This is one of the most important things you can do for your health. Most adults should:  Exercise for at least 150 minutes each week. The exercise should increase your heart rate and make you sweat (moderate-intensity exercise).  Do strengthening exercises at least twice a week. This is in addition to the moderate-intensity exercise.  Spend less time sitting. Even light physical activity can be beneficial. Watch cholesterol and blood lipids Have your blood tested for lipids and cholesterol at 53 years of age, then have this test every 5 years. Have your cholesterol levels checked more often if:  Your lipid or cholesterol levels are high.  You are older than 53 years of age.  You are at high risk for heart disease. What should I know about cancer screening? Depending on your health history and family history, you may need to have cancer screening at various ages. This may include screening for:  Breast cancer.  Cervical cancer.  Colorectal cancer.  Skin cancer.  Lung cancer. What should I know about heart disease, diabetes, and high blood  pressure? Blood pressure and heart disease  High blood pressure causes heart disease and increases the risk of stroke. This is more likely to develop in people who have high blood pressure readings, are of African descent, or are overweight.  Have your blood pressure checked: ? Every 3-5 years if you are 18-39 years of age. ? Every year if you are 40 years old or older. Diabetes Have regular diabetes screenings. This checks your fasting blood sugar level. Have the screening done:  Once every three years after age 40 if you are at a normal weight and have a low risk for diabetes.  More often and at a younger age if you are overweight or have a high risk for diabetes. What should I know about preventing infection? Hepatitis B If you have a higher risk for hepatitis B, you should be screened for this virus. Talk with your health care provider to find out if you are at risk for hepatitis B infection. Hepatitis C Testing is recommended for:  Everyone born from 1945 through 1965.  Anyone with known risk factors for hepatitis C. Sexually transmitted infections (STIs)  Get screened for STIs, including gonorrhea and chlamydia, if: ? You are sexually active and are younger than 53 years of age. ? You are older than 53 years of age and your health care provider tells you that you are at risk for this type of infection. ? Your sexual activity has changed since you were last screened, and you are at increased risk for chlamydia or gonorrhea. Ask your health care provider   if you are at risk.  Ask your health care provider about whether you are at high risk for HIV. Your health care provider may recommend a prescription medicine to help prevent HIV infection. If you choose to take medicine to prevent HIV, you should first get tested for HIV. You should then be tested every 3 months for as long as you are taking the medicine. Pregnancy  If you are about to stop having your period (premenopausal) and  you may become pregnant, seek counseling before you get pregnant.  Take 400 to 800 micrograms (mcg) of folic acid every day if you become pregnant.  Ask for birth control (contraception) if you want to prevent pregnancy. Osteoporosis and menopause Osteoporosis is a disease in which the bones lose minerals and strength with aging. This can result in bone fractures. If you are 65 years old or older, or if you are at risk for osteoporosis and fractures, ask your health care provider if you should:  Be screened for bone loss.  Take a calcium or vitamin D supplement to lower your risk of fractures.  Be given hormone replacement therapy (HRT) to treat symptoms of menopause. Follow these instructions at home: Lifestyle  Do not use any products that contain nicotine or tobacco, such as cigarettes, e-cigarettes, and chewing tobacco. If you need help quitting, ask your health care provider.  Do not use street drugs.  Do not share needles.  Ask your health care provider for help if you need support or information about quitting drugs. Alcohol use  Do not drink alcohol if: ? Your health care provider tells you not to drink. ? You are pregnant, may be pregnant, or are planning to become pregnant.  If you drink alcohol: ? Limit how much you use to 0-1 drink a day. ? Limit intake if you are breastfeeding.  Be aware of how much alcohol is in your drink. In the [place] one drink equals one 12 oz bottle of beer (355 mL), one 5 oz glass of wine (148 mL), or one 1 oz glass of hard liquor (44 mL). General instructions  Schedule regular health, dental, and eye exams.  Stay current with your vaccines.  Tell your health care provider if: ? You often feel depressed. ? You have ever been abused or do not feel safe at home. Summary  Adopting a healthy lifestyle and getting preventive care are important in promoting health and wellness.  Follow your health care provider's instructions about healthy  diet, exercising, and getting tested or screened for diseases.  Follow your health care provider's instructions on monitoring your cholesterol and blood pressure. This information is not intended to replace advice given to you by your health care provider. Make sure you discuss any questions you have with your health care provider. Document Revised: 07/16/2018 Document Reviewed: 07/16/2018 Elsevier Patient Education  2021 Elsevier Inc.  

## 2020-08-29 NOTE — Progress Notes (Signed)
   Cynthia [name] [birth year] [id number]   History:  55 y.o. [id number] presents for annual exam. Postmenopausal - no HRT. LMP November 2021 with some vaginal dryness and occasional hot flashes. 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 Normal mammogram history. 2000 HIV+ +0 viral load.   Gynecologic History No LMP recorded. Patient is postmenopausal.   Contraception/Family planning: post menopausal status  Health Maintenance Last Pap: 08/18/2019. Results were: normal Last mammogram: 01/2020. Results were: normal Last colonoscopy: 2019. Results were: polyp. 5-year recall Last Dexa: N/A  Past medical history, past surgical history, family history and social history were all reviewed and documented in the EPIC chart.  ROS:  A ROS was performed and pertinent positives and negatives are included.  Exam:  Vitals:   08/29/20 1621  BP: 122/80  Weight: 141 lb (64 kg)  Height: 5\' 5"  (1.651 m)   Body mass index is 23.46 kg/m.  General appearance:  Normal Thyroid:  Symmetrical, normal in size, without palpable masses or nodularity. Respiratory  Auscultation:  Clear without wheezing or rhonchi Cardiovascular  Auscultation:  Regular rate, without rubs, murmurs or gallops  Edema/varicosities:  Not grossly evident Abdominal  Soft,nontender, without masses, guarding or rebound.  Liver/spleen:  No organomegaly noted  Hernia:  None appreciated  Skin  Inspection:  Grossly normal   Breasts: Examined lying and sitting.   Right: Without masses, retractions, discharge or axillary adenopathy.   Left: Without masses, retractions, discharge or axillary adenopathy. Gentitourinary   Inguinal/mons:  Normal without inguinal adenopathy  External genitalia:  Normal  BUS/Urethra/Skene's glands:  Normal  Vagina:  Normal  Cervix:  Normal  Uterus:  Anteverted, normal in size, shape and contour.  Midline and mobile  Adnexa/parametria:     Rt: Without masses or tenderness.   Lt: Without masses or  tenderness.  Anus and perineum: Normal  Digital rectal exam: Normal sphincter tone without palpated masses or tenderness  Assessment/Plan:  55 y.o. [id number] for annual exam.   Well female exam with routine gynecological exam - Education provided on SBEs, importance of preventative screenings, current guidelines, high calcium diet, regular exercise, and multivitamin daily. Labs with PCP.   Screening for cervical cancer - 1995 CIN-1 LEEP, subsequent paps normal. Annual paps due to HIV status.   Screening for breast cancer - Normal mammogram history.  Continue annual screenings.  Normal breast exam today.  Screening for colon cancer - 2019 colonoscopy. Will repeat at GI's recommended interval.   Follow up in 1 year for annual.    Tamela Gammon [facility], 4:47 PM 08/29/2020

## 2020-08-29 NOTE — Addendum Note (Signed)
Addended by: Janalyn Harder A on: 08/29/2020 05:00 PM   Modules accepted: Orders

## 2020-08-30 LAB — PAP IG W/ RFLX HPV ASCU

## 2020-09-15 MED FILL — BIKTARVY 50-200-25 MG TABS: 50-200-25 | 30 days supply | Qty: 30 | Fill #1

## 2020-10-11 ENCOUNTER — Other Ambulatory Visit (HOSPITAL_COMMUNITY): Payer: Self-pay | Admitting: Pharmacist

## 2020-10-11 MED FILL — BIKTARVY 50-200-25 MG TABS: 50-200-25 | 30 days supply | Qty: 30 | Fill #2

## 2020-10-11 MED FILL — CARESTART COVID-19 HOME TES: 4 days supply | Qty: 4 | Fill #0

## 2020-11-03 ENCOUNTER — Other Ambulatory Visit (HOSPITAL_COMMUNITY): Payer: Self-pay

## 2020-11-03 ENCOUNTER — Other Ambulatory Visit: Payer: Self-pay | Admitting: Internal Medicine

## 2020-11-03 DIAGNOSIS — I10 Essential (primary) hypertension: Secondary | ICD-10-CM

## 2020-11-05 ENCOUNTER — Other Ambulatory Visit (HOSPITAL_COMMUNITY): Payer: Self-pay

## 2020-11-05 MED FILL — Lisinopril Tab 10 MG: ORAL | 90 days supply | Qty: 90 | Fill #0 | Status: AC

## 2020-11-05 MED FILL — Bictegravir-Emtricitabine-Tenofovir AF Tab 50-200-25 MG: ORAL | 30 days supply | Qty: 30 | Fill #0 | Status: AC

## 2020-11-05 MED FILL — Rosuvastatin Calcium Tab 10 MG: ORAL | 30 days supply | Qty: 30 | Fill #0 | Status: AC

## 2020-11-06 ENCOUNTER — Other Ambulatory Visit (HOSPITAL_COMMUNITY): Payer: Self-pay

## 2020-11-07 ENCOUNTER — Other Ambulatory Visit (HOSPITAL_COMMUNITY): Payer: Self-pay

## 2020-11-08 ENCOUNTER — Other Ambulatory Visit (HOSPITAL_COMMUNITY): Payer: Self-pay

## 2020-11-10 ENCOUNTER — Other Ambulatory Visit (HOSPITAL_COMMUNITY): Payer: Self-pay

## 2020-11-14 ENCOUNTER — Encounter: Payer: Self-pay | Admitting: Physician Assistant

## 2020-11-14 ENCOUNTER — Telehealth: Payer: 59 | Admitting: Physician Assistant

## 2020-11-14 DIAGNOSIS — A084 Viral intestinal infection, unspecified: Secondary | ICD-10-CM

## 2020-11-14 MED ORDER — ONDANSETRON 4 MG PO TBDP: 4.0000 mg | ORAL_TABLET | Freq: Three times a day (TID) | ORAL | 0 refills | Status: DC | PRN

## 2020-11-14 NOTE — Progress Notes (Signed)
Cynthia [name], furr are scheduled for a virtual visit with your provider today.    Just as we do with appointments in the office, we must obtain your consent to participate.  Your consent will be active for this visit and any virtual visit you may have with one of our providers in the next 365 days.    If you have a MyChart account, I can also send a copy of this consent to you electronically.  All virtual visits are billed to your insurance company just like a traditional visit in the office.  As this is a virtual visit, video technology does not allow for your provider to perform a traditional examination.  This may limit your provider's ability to fully assess your condition.  If your provider identifies any concerns that need to be evaluated in person or the need to arrange testing such as labs, EKG, etc, we will make arrangements to do so.    Although advances in technology are sophisticated, we cannot ensure that it will always work on either your end or our end.  If the connection with a video visit is poor, we may have to switch to a telephone visit.  With either a video or telephone visit, we are not always able to ensure that we have a secure connection.   I need to obtain your verbal consent now.   Are you willing to proceed with your visit today?   DOMINIQUE RESSEL has provided verbal consent on 11/14/2020 for a virtual visit (video or telephone).   Mar Daring, PA-C 11/14/2020  5:23 PM   MyChart Video Visit    Virtual Visit via Video Note   This visit type was conducted due to national recommendations for restrictions regarding the COVID-19 Pandemic (e.g. social distancing) in an effort to limit this patient's exposure and mitigate transmission in our community. This patient is at least at moderate risk for complications without adequate follow up. This format is felt to be most appropriate for this patient at this time. Physical exam was limited by quality of the video and audio  technology used for the visit.   Patient location: Home Provider location: Home office in [place]  I discussed the limitations of evaluation and management by telemedicine and the availability of in person appointments. The patient expressed understanding and agreed to proceed.  Patient: Cynthia [name]   DOB: 18-Dec-1965   55 y.o. Female  MRN: [id number] Visit Date: 11/14/2020  Today's healthcare provider: Mar Daring, PA-C   No chief complaint on file.  Subjective    Emesis  This is a new problem. The current episode started in the past 7 days (Started Saturday (11/12/20) evening). The problem occurs 5 to 10 times per day. The problem has been unchanged. The emesis has an appearance of bile. The maximum temperature recorded prior to her arrival was 102 - 102.9 F. The fever has been present for 1 to 2 days. Associated symptoms include abdominal pain, chills, diarrhea, a fever, headaches and myalgias. Pertinent negatives include no chest pain, coughing, dizziness, sweats, URI or weight loss. Risk factors include ill contacts (was around [place] at the zoo and both became sick Saturday). She has tried acetaminophen, bed rest, increased fluids and sleep (having trouble keeping down) for the symptoms. The treatment provided no relief.     Patient Active Problem List   Diagnosis Date Noted  . 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 Hypertension   . HIV DISEASE 11/07/2006  .  DISEASE, HYPERTENSIVE HEART, BENIGN, W/HF 11/07/2006   Past Medical History:  Diagnosis Date  . Allergy   . 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 COVID-19   . GERD (gastroesophageal reflux disease)   . HIV (human immunodeficiency virus infection) ([place])   . HIV infection ([place])   . Hypertension       Medications: Outpatient Medications Prior to Visit  Medication Sig  . bictegravir-emtricitabine-tenofovir AF (BIKTARVY) 50-200-25 MG TABS tablet TAKE 1 TABLET BY MOUTH DAILY. *NEED OFFICE VISIT*  . cetirizine (ZYRTEC) 10 MG  tablet Take 10 mg by mouth daily.  . Cholecalciferol (VITAMIN D) 2000 UNITS tablet Take 2,000 Units by mouth daily.  Marland Kitchen COVID-19 At Home Antigen Test KIT USE AS DIRECTED WITHIN PACKAGE INSTRUCTIONS  . lisinopril (ZESTRIL) 10 MG tablet TAKE 1 TABLET BY MOUTH DAILY  . rosuvastatin (CRESTOR) 10 MG tablet TAKE 1 TABLET BY MOUTH DAILY.   Facility-Administered Medications Prior to Visit  Medication Dose Route Frequency Provider  . 0.9 %  sodium chloride infusion  500 mL Intravenous Once Armbruster, Carlota Raspberry, MD    Review of Systems  Constitutional: Positive for appetite change, chills, fatigue and fever. Negative for weight loss.  HENT: Negative.   Respiratory: Negative for cough, chest tightness and shortness of breath.   Cardiovascular: Negative for chest pain.  Gastrointestinal: Positive for abdominal distention, abdominal pain, diarrhea, nausea and vomiting. Negative for anal bleeding and blood in stool.  Musculoskeletal: Positive for myalgias.  Neurological: Positive for headaches. Negative for dizziness.    Last CBC Lab Results  Component Value Date   WBC 4.2 07/27/2020   HGB 12.6 07/27/2020   HCT 36.9 07/27/2020   MCV 94.9 07/27/2020   MCH 32.4 07/27/2020   RDW 11.5 07/27/2020   PLT 196 35/57/3220   Last metabolic panel Lab Results  Component Value Date   GLUCOSE 103 (H) 07/27/2020   NA 138 07/27/2020   K 4.4 07/27/2020   CL 105 07/27/2020   CO2 27 07/27/2020   BUN 19 07/27/2020   CREATININE 1.17 (H) 07/27/2020   GFRNONAA 54 (L) 07/27/2020   GFRAA 62 07/27/2020   CALCIUM 9.4 07/27/2020   PROT 7.0 07/27/2020   ALBUMIN 4.2 07/04/2016   BILITOT 0.4 07/27/2020   ALKPHOS 27 (L) 07/04/2016   AST 29 07/27/2020   ALT 32 (H) 07/27/2020      Objective    There were no vitals taken for this visit. BP Readings from Last 3 Encounters:  08/29/20 122/80  07/27/20 120/77  01/25/20 109/74   Wt Readings from Last 3 Encounters:  08/29/20 141 lb (64 kg)  07/27/20 143 lb  (64.9 kg)  01/25/20 139 lb (63 kg)      Physical Exam Vitals reviewed.  Constitutional:      General: She is not in acute distress.    Appearance: Normal appearance. She is well-developed. She is ill-appearing.  HENT:     Head: Normocephalic and atraumatic.  Pulmonary:     Effort: Pulmonary effort is normal. No respiratory distress.  Musculoskeletal:     Cervical back: Normal range of motion and neck supple.  Neurological:     Mental Status: She is alert.  Psychiatric:        Mood and Affect: Mood normal.        Behavior: Behavior normal.        Thought Content: Thought content normal.        Judgment: Judgment normal.       Assessment & Plan  1. Viral gastroenteritis - Suspected viral gastroenteritis. - Zofran ODT for symptomatic relief of nausea and vomiting - Recommended Imodium OTC for diarrhea - Increase fluids (discussed adding gatorade or pedialyte for electrolytes) - BRAT diet and increase as tolerated - Advised to contact PCP if not improving over next 5 days, may require stool studies since symptoms started after a trip to the zoo - Seek urgent evaluation if symptoms worsen or unable to maintain liquid intake  - ondansetron (ZOFRAN ODT) 4 MG disintegrating tablet; Take 1 tablet (4 mg total) by mouth every 8 (eight) hours as needed for nausea or vomiting.  Dispense: 20 tablet; Refill: 0   No follow-ups on file.     I discussed the assessment and treatment plan with the patient. The patient was provided an opportunity to ask questions and all were answered. The patient agreed with the plan and demonstrated an understanding of the instructions.   The patient was advised to call back or seek an in-person evaluation if the symptoms worsen or if the condition fails to improve as anticipated.  I provided 14 minutes of face-to-face time during this encounter via MyChart Video enabled encounter.  Reynolds Bowl, PA-C, have reviewed all documentation for  this visit. The documentation on 11/14/20 for the exam, diagnosis, procedures, and orders are all accurate and complete.  [place] [phone] (phone) [phone] (fax)  [place]

## 2020-11-14 NOTE — Patient Instructions (Signed)
Viral Gastroenteritis, Adult  Viral gastroenteritis is also known as the stomach flu. This condition may affect your stomach, small intestine, and large intestine. It can cause sudden watery diarrhea, fever, and vomiting. This condition is caused by many different viruses. These viruses can be passed from person to person very easily (are contagious). Diarrhea and vomiting can make you feel weak and cause you to become dehydrated. You may not be able to keep fluids down. Dehydration can make you tired and thirsty, cause you to have a dry mouth, and decrease how often you urinate. It is important to replace the fluids that you lose from diarrhea and vomiting. What are the causes? Gastroenteritis is caused by many viruses, including rotavirus and norovirus. Norovirus is the most common cause in adults. You can get sick after being exposed to the viruses from other people. You can also get sick by:  Eating food, drinking water, or touching a surface contaminated with one of these viruses.  Sharing utensils or other personal items with an infected person. What increases the risk? You are more likely to develop this condition if you:  Have a weak body defense system (immune system).  Live with one or more children who are younger than 2 years old.  Live in a nursing home.  Travel on cruise ships. What are the signs or symptoms? Symptoms of this condition start suddenly 1-3 days after exposure to a virus. Symptoms may last for a few days or for as long as a week. Common symptoms include watery diarrhea and vomiting. Other symptoms include:  Fever.  Headache.  Fatigue.  Pain in the abdomen.  Chills.  Weakness.  Nausea.  Muscle aches.  Loss of appetite. How is this diagnosed? This condition is diagnosed with a medical history and physical exam. You may also have a stool test to check for viruses or other infections. How is this treated? This condition typically goes away on its  own. The focus of treatment is to prevent dehydration and restore lost fluids (rehydration). This condition may be treated with:  An oral rehydration solution (ORS) to replace important salts and minerals (electrolytes) in your body. Take this if told by your health care provider. This is a drink that is sold at pharmacies and retail stores.  Medicines to help with your symptoms.  Probiotic supplements to reduce symptoms of diarrhea.  Fluids given through an IV, if dehydration is severe. Older adults and people with other diseases or a weak immune system are at higher risk for dehydration. Follow these instructions at home: Eating and drinking  Take an ORS as told by your health care provider.  Drink clear fluids in small amounts as you are able. Clear fluids include: ? Water. ? Ice chips. ? Diluted fruit juice. ? Low-calorie sports drinks.  Drink enough fluid to keep your urine pale yellow.  Eat small amounts of healthy foods every 3-4 hours as you are able. This may include whole grains, fruits, vegetables, lean meats, and yogurt.  Avoid fluids that contain a lot of sugar or caffeine, such as energy drinks, sports drinks, and soda.  Avoid spicy or fatty foods.  Avoid alcohol.   General instructions  Wash your hands often, especially after having diarrhea or vomiting. If soap and water are not available, use hand sanitizer.  Make sure that all people in your household wash their hands well and often.  Take over-the-counter and prescription medicines only as told by your health care provider.  Rest at   home while you recover.  Watch your condition for any changes.  Take a warm bath to relieve any burning or pain from frequent diarrhea episodes.  Keep all follow-up visits as told by your health care provider. This is important.   Contact a health care provider if you:  Cannot keep fluids down.  Have symptoms that get worse.  Have new symptoms.  Feel light-headed or  dizzy.  Have muscle cramps. Get help right away if you:  Have chest pain.  Feel extremely weak or you faint.  See blood in your vomit.  Have vomit that looks like coffee grounds.  Have bloody or black stools or stools that look like tar.  Have a severe headache, a stiff neck, or both.  Have a rash.  Have severe pain, cramping, or bloating in your abdomen.  Have trouble breathing or you are breathing very quickly.  Have a fast heartbeat.  Have skin that feels cold and clammy.  Feel confused.  Have pain when you urinate.  Have signs of dehydration, such as: ? Dark urine, very little urine, or no urine. ? Cracked lips. ? Dry mouth. ? Sunken eyes. ? Sleepiness. ? Weakness. Summary  Viral gastroenteritis is also known as the stomach flu. It can cause sudden watery diarrhea, fever, and vomiting.  This condition can be passed from person to person very easily (is contagious).  Take an ORS if told by your health care provider. This is a drink that is sold at pharmacies and retail stores.  Wash your hands often, especially after having diarrhea or vomiting. If soap and water are not available, use hand sanitizer. This information is not intended to replace advice given to you by your health care provider. Make sure you discuss any questions you have with your health care provider. Document Revised: 01/09/2019 Document Reviewed: 05/28/2018 Elsevier Patient Education  2021 [place] Diet A bland diet consists of foods that are often soft and do not have a lot of fat, fiber, or extra seasonings. Foods without fat, fiber, or seasoning are easier for the body to digest. They are also less likely to irritate your mouth, throat, stomach, and other parts of your digestive system. A bland diet is sometimes called a BRAT diet. What is my plan? Your health care provider or food and nutrition specialist (dietitian) may recommend specific changes to your diet to prevent  symptoms or to treat your symptoms. These changes may include:  Eating small meals often.  Cooking food until it is soft enough to chew easily.  Chewing your food well.  Drinking fluids slowly.  Not eating foods that are very spicy, sour, or fatty.  Not eating citrus fruits, such as oranges and grapefruit. What do I need to know about this diet?  Eat a variety of foods from the bland diet food list.  Do not follow a bland diet longer than needed.  Ask your health care provider whether you should take vitamins or supplements. What foods can I eat? Grains Hot cereals, such as cream of wheat. Rice. Bread, crackers, or tortillas made from refined white flour.   Vegetables Canned or cooked vegetables. Mashed or boiled potatoes. Fruits Bananas. Applesauce. Other types of cooked or canned fruit with the skin and seeds removed, such as canned peaches or pears.   Meats and other proteins Scrambled eggs. Creamy peanut butter or other nut butters. Lean, well-cooked meats, such as chicken or fish. Tofu. Soups or broths.   Dairy Low-fat  dairy products, such as milk, cottage cheese, or yogurt. Beverages Water. Herbal tea. Apple juice.   Fats and oils Mild salad dressings. Canola or olive oil. Sweets and desserts Pudding. Custard. Fruit gelatin. Ice cream. The items listed above may not be a complete list of recommended foods and beverages. Contact a dietitian for more options. What foods are not recommended? Grains Whole grain breads and cereals. Vegetables Raw vegetables. Fruits Raw fruits, especially citrus, berries, or dried fruits. Dairy Whole fat dairy foods. Beverages Caffeinated drinks. Alcohol. Seasonings and condiments Strongly flavored seasonings or condiments. Hot sauce. Salsa. Other foods Spicy foods. Fried foods. Sour foods, such as pickled or fermented foods. Foods with high sugar content. Foods high in fiber. The items listed above may not be a complete list of  foods and beverages to avoid. Contact a dietitian for more information. Summary  A bland diet consists of foods that are often soft and do not have a lot of fat, fiber, or extra seasonings.  Foods without fat, fiber, or seasoning are easier for the body to digest.  Check with your health care provider to see how long you should follow this diet plan. It is not meant to be followed for long periods. This information is not intended to replace advice given to you by your health care provider. Make sure you discuss any questions you have with your health care provider. Document Revised: 08/21/2017 Document Reviewed: 08/21/2017 Elsevier Patient Education  2021 [employer].

## 2020-11-17 ENCOUNTER — Ambulatory Visit: Payer: 59 | Admitting: Infectious Diseases

## 2020-11-17 ENCOUNTER — Encounter: Payer: Self-pay | Admitting: Infectious Diseases

## 2020-11-17 ENCOUNTER — Other Ambulatory Visit: Payer: Self-pay

## 2020-11-17 DIAGNOSIS — I10 Essential (primary) hypertension: Secondary | ICD-10-CM | POA: Diagnosis not present

## 2020-11-17 DIAGNOSIS — K529 Noninfective gastroenteritis and colitis, unspecified: Secondary | ICD-10-CM | POA: Diagnosis not present

## 2020-11-17 NOTE — Assessment & Plan Note (Addendum)
Hold BP rx for now.  She will follow her BP with home monitor, restart when > 145/90

## 2020-11-17 NOTE — Addendum Note (Signed)
Addended by: Caffie Pinto on: 11/17/2020 04:37 PM   Modules accepted: Orders

## 2020-11-17 NOTE — Progress Notes (Signed)
   Subjective:    Patient ID: Cynthia [name], female  DOB: [birth year], 55 y.o.        MRN: [id number]   HPI 55 yo F with hx of HIV since 1990. On biktarvy ~ 2 years. She is expertly cared for by Dr Baxter Flattery after Dr Orene Desanctis.  She now presents with ~1 week of n/v, diarrhea.  She had COVID test (-).  She was given zofran with improvement in n/v.  She has felt weak. Has noted low BP readings.   Her temp was up to 102.5 until 11-15-20. None since. She initially took anti-pyretics but since has only taken 1 dose with no further fever.  No sick contacts. Was at [place] with grandkids prior and one of them was ill for 24h afterwards.   Has noted blood with wiping but no blood in stool.   HIV 1 RNA Quant  Date Value  07/27/2020 <20 Copies/mL  01/12/2020 <20 NOT DETECTED copies/mL  01/27/2019 <20 NOT DETECTED copies/mL   CD4 T Cell Abs (/uL)  Date Value  07/27/2020 601  01/12/2020 698  01/27/2019 785     Health Maintenance  Topic Date Due  . COVID-19 Vaccine (1) Never done  . PAP SMEAR-Modifier  07/17/2019  . INFLUENZA VACCINE  03/06/2021  . MAMMOGRAM  02/01/2022  . COLONOSCOPY (Pts 45-35yrs Insurance coverage will need to be confirmed)  04/03/2023  . TETANUS/TDAP  08/07/2026  . Hepatitis C Screening  Completed  . HIV Screening  Completed  . HPV VACCINES  Aged Out      Review of Systems  Constitutional: Positive for fever and malaise/fatigue. Negative for chills and weight loss.  Gastrointestinal: Positive for diarrhea, nausea and vomiting. Negative for blood in stool and constipation.    Please see HPI. All other systems reviewed and negative.     Objective:  Physical Exam Vitals reviewed.  Constitutional:      Appearance: Normal appearance.  HENT:     Mouth/Throat:     Mouth: Mucous membranes are moist.     Pharynx: No oropharyngeal exudate.  Eyes:     Extraocular Movements: Extraocular movements intact.     Pupils: Pupils are equal, round, and reactive to light.   Cardiovascular:     Rate and Rhythm: Normal rate and regular rhythm.  Pulmonary:     Effort: Pulmonary effort is normal.     Breath sounds: Normal breath sounds.  Abdominal:     General: Bowel sounds are normal. There is no distension.     Palpations: Abdomen is soft.     Tenderness: There is no abdominal tenderness. There is no guarding.  Musculoskeletal:     Cervical back: Normal range of motion and neck supple.  Neurological:     Mental Status: She is alert.  Psychiatric:        Mood and Affect: Mood normal.            Assessment & Plan:

## 2020-11-17 NOTE — Assessment & Plan Note (Signed)
We discussed several options of what this could be (noro, rota, salmonella, shigella, campylobacter, ect) Will check GI panel at her request Hold on giving anbx She will take imodium otc (as she is afeb and has benign abd, actually improving).  Encourage po fluids. Lots hand washing Will send her message through [place] with results.  rtc prn.

## 2020-11-21 ENCOUNTER — Other Ambulatory Visit: Payer: 59

## 2020-11-21 ENCOUNTER — Other Ambulatory Visit: Payer: Self-pay

## 2020-11-21 DIAGNOSIS — I10 Essential (primary) hypertension: Secondary | ICD-10-CM | POA: Diagnosis not present

## 2020-11-21 DIAGNOSIS — K529 Noninfective gastroenteritis and colitis, unspecified: Secondary | ICD-10-CM

## 2020-11-22 ENCOUNTER — Other Ambulatory Visit: Payer: 59

## 2020-11-25 ENCOUNTER — Telehealth: Payer: Self-pay | Admitting: Infectious Disease

## 2020-11-25 LAB — GASTROINTESTINAL PATHOGEN PANEL PCR
C. difficile Tox A/B, PCR: NOT DETECTED
Campylobacter, PCR: DETECTED — AB
Cryptosporidium, PCR: NOT DETECTED
E coli (ETEC) LT/ST PCR: NOT DETECTED
E coli (STEC) stx1/stx2, PCR: NOT DETECTED
E coli 0157, PCR: NOT DETECTED
Giardia lamblia, PCR: NOT DETECTED
Norovirus, PCR: NOT DETECTED
Rotavirus A, PCR: NOT DETECTED
Salmonella, PCR: NOT DETECTED
Shigella, PCR: NOT DETECTED

## 2020-11-25 NOTE — Telephone Encounter (Signed)
Patient's Campylobacter PCR came back from her GI pathogen panel that was taken nearly 8 days ago.  I called her to see how she was doing but she did not pick up can we check in on her on Monday and see if she needs any antibiotics such as azithromycin   The diarrhea is resolved she certainly does not need it

## 2020-11-29 ENCOUNTER — Telehealth: Payer: Self-pay

## 2020-11-29 NOTE — Progress Notes (Signed)
Per Dr. Johnnye Sima he is holding off on azithromycin if patient is better. Patient states she is feeling better today, diarrhea has resolved from yesterday.

## 2020-11-29 NOTE — Telephone Encounter (Signed)
-----   Message from Truman Hayward, MD sent at 11/29/2020  8:31 AM EDT ----- If it is just one episode I dont think it needs to be treated but  wil refer back to Dr. Johnnye Sima as he is her MD ----- Message ----- From: Eugenia Mcalpine, LPN Sent: 7/67/2094   9:48 AM EDT To: Truman Hayward, MD  Patient states one new episode of diarrhea this morning.

## 2020-12-07 ENCOUNTER — Other Ambulatory Visit (HOSPITAL_COMMUNITY): Payer: Self-pay

## 2020-12-07 MED FILL — Bictegravir-Emtricitabine-Tenofovir AF Tab 50-200-25 MG: ORAL | 30 days supply | Qty: 30 | Fill #1 | Status: AC

## 2020-12-15 ENCOUNTER — Other Ambulatory Visit (HOSPITAL_COMMUNITY): Payer: Self-pay

## 2020-12-30 ENCOUNTER — Other Ambulatory Visit (HOSPITAL_COMMUNITY): Payer: Self-pay

## 2020-12-30 MED FILL — Rosuvastatin Calcium Tab 10 MG: ORAL | 30 days supply | Qty: 30 | Fill #1 | Status: AC

## 2021-01-09 ENCOUNTER — Other Ambulatory Visit (HOSPITAL_COMMUNITY): Payer: Self-pay

## 2021-01-15 MED FILL — Bictegravir-Emtricitabine-Tenofovir AF Tab 50-200-25 MG: ORAL | 30 days supply | Qty: 30 | Fill #2 | Status: AC

## 2021-01-16 ENCOUNTER — Other Ambulatory Visit (HOSPITAL_COMMUNITY): Payer: Self-pay

## 2021-01-26 ENCOUNTER — Ambulatory Visit: Payer: 59 | Admitting: Internal Medicine

## 2021-01-26 ENCOUNTER — Other Ambulatory Visit (HOSPITAL_COMMUNITY): Payer: Self-pay

## 2021-01-26 ENCOUNTER — Other Ambulatory Visit: Payer: Self-pay

## 2021-01-26 ENCOUNTER — Encounter: Payer: Self-pay | Admitting: Internal Medicine

## 2021-01-26 VITALS — BP 107/75 | HR 73 | Temp 98.0°F | Wt 141.0 lb

## 2021-01-26 DIAGNOSIS — B20 Human immunodeficiency virus [HIV] disease: Secondary | ICD-10-CM

## 2021-01-26 DIAGNOSIS — R635 Abnormal weight gain: Secondary | ICD-10-CM

## 2021-01-26 DIAGNOSIS — Z79899 Other long term (current) drug therapy: Secondary | ICD-10-CM | POA: Diagnosis not present

## 2021-01-26 MED ORDER — DORAVIRINE 100 MG PO TABS
100.0000 mg | ORAL_TABLET | Freq: Every day | ORAL | 11 refills | Status: DC
  Filled 2021-01-26 – 2021-01-27 (×2): qty 30, 30d supply, fill #0
  Filled 2021-03-14: qty 30, 30d supply, fill #1
  Filled 2021-04-13: qty 30, 30d supply, fill #2
  Filled 2021-05-10 (×2): qty 30, 30d supply, fill #3
  Filled 2021-06-06: qty 30, 30d supply, fill #4
  Filled 2021-07-05: qty 30, 30d supply, fill #5
  Filled 2021-08-01: qty 30, 30d supply, fill #6
  Filled 2021-09-01: qty 30, 30d supply, fill #7
  Filled 2021-09-26: qty 30, 30d supply, fill #8
  Filled 2021-11-10: qty 30, 30d supply, fill #9
  Filled 2021-12-04: qty 30, 30d supply, fill #10
  Filled 2021-12-28: qty 30, 30d supply, fill #11

## 2021-01-26 MED ORDER — EMTRICITABINE-TENOFOVIR AF 200-25 MG PO TABS
1.0000 | ORAL_TABLET | Freq: Every day | ORAL | 11 refills | Status: DC
  Filled 2021-01-26 – 2021-01-27 (×2): qty 30, 30d supply, fill #0
  Filled 2021-03-14: qty 30, 30d supply, fill #1
  Filled 2021-04-13: qty 30, 30d supply, fill #2
  Filled 2021-05-10: qty 30, 30d supply, fill #3
  Filled 2021-06-06: qty 30, 30d supply, fill #4
  Filled 2021-07-05: qty 30, 30d supply, fill #5
  Filled 2021-08-01: qty 30, 30d supply, fill #6
  Filled 2021-09-01: qty 30, 30d supply, fill #7
  Filled 2021-09-26: qty 30, 30d supply, fill #8
  Filled 2021-11-10: qty 30, 30d supply, fill #9
  Filled 2021-12-04: qty 30, 30d supply, fill #10
  Filled 2021-12-28: qty 30, 30d supply, fill #11

## 2021-01-26 NOTE — Progress Notes (Signed)
RFV: follow up for hiv disease   Patient ID: Cynthia [name], female   DOB: [birth year], 55 y.o.   MRN: [id number]  HPI CD 4 count of 601/VL <20 (dec 2021) previously on atripla, then changed to biktarvy - concern for weight gain with biktarvy, this is heaviest she has been over the last 3-74yrs. She mentions that her diet and exercise is largerly unchanged in the same time period. Still making efforts to improve both because of weight gain.  Outpatient Encounter Medications as of 01/26/2021  Medication Sig   bictegravir-emtricitabine-tenofovir AF (BIKTARVY) 50-200-25 MG TABS tablet TAKE 1 TABLET BY MOUTH DAILY. *NEED OFFICE VISIT*   cetirizine (ZYRTEC) 10 MG tablet Take 10 mg by mouth daily.   Cholecalciferol (VITAMIN D) 2000 UNITS tablet Take 2,000 Units by mouth daily.   lisinopril (ZESTRIL) 10 MG tablet TAKE 1 TABLET BY MOUTH DAILY   rosuvastatin (CRESTOR) 10 MG tablet TAKE 1 TABLET BY MOUTH DAILY.   ondansetron (ZOFRAN ODT) 4 MG disintegrating tablet Take 1 tablet (4 mg total) by mouth every 8 (eight) hours as needed for nausea or vomiting. 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[place])    Hypertension    HIV DISEASE 11/07/2006   DISEASE, HYPERTENSIVE HEART, BENIGN, W/HF 11/07/2006     Health Maintenance Due  Topic Date Due   COVID-19 Vaccine (1) Never done   Pneumococcal Vaccine 38-58 Years old (1 - PCV) Never done   Zoster Vaccines- Shingrix (1 of 2) Never done   PAP SMEAR-Modifier  07/17/2019    Sochx: occasional alcohol.1-2 drink per week/ no smoking  Review of Systems 12 point ros is negative except for weight gain Physical Exam   BP 107/75   Pulse 73   Temp 98 F (36.7 C) (Oral)   Wt 141 lb (64 kg)   BMI 23.46 kg/m   Gen =a x o by 4 in NAD HEENT=  PERRLA, MMM, no op erythema or plaque, good dentition. No scleral icterus Axilla= no palpable LN swelling Neck = no submandibular or cervical LN swelling Skin = no signs of rash/echymosis. Lab Results  Component Value Date   CD4TCELL 48 07/27/2020   Lab Results  Component Value Date   CD4TABS 601 07/27/2020   CD4TABS 698 01/12/2020   CD4TABS 785 01/27/2019   Lab Results  Component Value Date   HIV1RNAQUANT <20 07/27/2020   Lab Results  Component Value Date   HEPBSAB No 09/30/2006   Lab Results  Component Value Date   LABRPR NON-REACTIVE 07/27/2020    CBC Lab Results  Component Value Date   WBC 4.2 07/27/2020   RBC 3.89 07/27/2020   HGB 12.6 07/27/2020   HCT 36.9 07/27/2020   PLT 196 07/27/2020   MCV 94.9 07/27/2020   MCH 32.4 07/27/2020   MCHC 34.1 07/27/2020   RDW 11.5 07/27/2020   LYMPHSABS 1,252 07/27/2020   MONOABS 352 02/29/2016   EOSABS 172 07/27/2020    BMET Lab Results  Component Value Date   NA 138 07/27/2020   K 4.4 07/27/2020   CL 105 07/27/2020   CO2 27 07/27/2020   GLUCOSE 103 (H) 07/27/2020   BUN 19 07/27/2020   CREATININE 1.17 (H) 07/27/2020   CALCIUM 9.4 07/27/2020   GFRNONAA 54 (L) 07/27/2020  GFRAA 62 07/27/2020      Assessment and Plan  Hiv disease  = will change off of biktarvy and switch to descovy plus doravarine (she is okay with switching to 2 pills). Will have her come back in 6 wk for repeat VL to ensure still undetectable. Will check 6 months labs today.Discussed plan for change of meds and side effects  Long term medication management = will check cr  Weight gain = encourage decreasing proportion of carbs/sugars and increase exercise.    Health maitenance =  due for mammo  this summer & colonoscopy until 2024.

## 2021-01-27 ENCOUNTER — Telehealth: Payer: Self-pay

## 2021-01-27 ENCOUNTER — Other Ambulatory Visit (HOSPITAL_COMMUNITY): Payer: Self-pay

## 2021-01-27 LAB — T-HELPER CELL (CD4) - (RCID CLINIC ONLY)
CD4 % Helper T Cell: 46 % (ref 33–65)
CD4 T Cell Abs: 890 /uL (ref 400–1790)

## 2021-01-27 NOTE — Telephone Encounter (Signed)
[facility] Patient Advocate Encounter °  °Was successful in obtaining a [employer] copay card for [employer].  This copay card will make the patients copay 0.00. ° °I have spoken with the patient.   ° °The billing information is as follows and has been shared with [facility]. ° ° ° ° ° ° °Kollyn Lingafelter, CPhT °Specialty Pharmacy Patient Advocate °[facility] °Phone: [phone] °Fax:  [phone]  °

## 2021-01-27 NOTE — Telephone Encounter (Signed)
[facility] Patient Advocate Encounter   Was successful in obtaining a [employer] copay card for [employer].  This copay card will make the patients copay 0.00.  I have spoken with the patient.    The billing information is as follows and has been shared with [facility].     Ileene Patrick, [facility] Patient [facility] Phone: [phone] Fax:  [phone]

## 2021-01-28 LAB — COMPLETE METABOLIC PANEL WITH GFR
AG Ratio: 1.7 (calc) (ref 1.0–2.5)
ALT: 22 U/L (ref 6–29)
AST: 22 U/L (ref 10–35)
Albumin: 4.6 g/dL (ref 3.6–5.1)
Alkaline phosphatase (APISO): 55 U/L (ref 37–153)
BUN/Creatinine Ratio: 20 (calc) (ref 6–22)
BUN: 26 mg/dL — ABNORMAL HIGH (ref 7–25)
CO2: 24 mmol/L (ref 20–32)
Calcium: 9.6 mg/dL (ref 8.6–10.4)
Chloride: 106 mmol/L (ref 98–110)
Creat: 1.31 mg/dL — ABNORMAL HIGH (ref 0.50–1.05)
GFR, Est African American: 54 mL/min/{1.73_m2} — ABNORMAL LOW (ref >=60)
GFR, Est Non African American: 46 mL/min/{1.73_m2} — ABNORMAL LOW (ref >=60)
Globulin: 2.7 g/dL (calc) (ref 1.9–3.7)
Glucose, Bld: 70 mg/dL (ref 65–99)
Potassium: 4.3 mmol/L (ref 3.5–5.3)
Sodium: 140 mmol/L (ref 135–146)
Total Bilirubin: 0.4 mg/dL (ref 0.2–1.2)
Total Protein: 7.3 g/dL (ref 6.1–8.1)

## 2021-01-28 LAB — CBC WITH DIFFERENTIAL/PLATELET
Absolute Monocytes: 371 cells/uL (ref 200–950)
Basophils Absolute: 32 cells/uL (ref 0–200)
Basophils Relative: 0.6 %
Eosinophils Absolute: 111 cells/uL (ref 15–500)
Eosinophils Relative: 2.1 %
HCT: 36.6 % (ref 35.0–45.0)
Hemoglobin: 12.5 g/dL (ref 11.7–15.5)
Lymphs Abs: 1913 cells/uL (ref 850–3900)
MCH: 32 pg (ref 27.0–33.0)
MCHC: 34.2 g/dL (ref 32.0–36.0)
MCV: 93.6 fL (ref 80.0–100.0)
MPV: 10.4 fL (ref 7.5–12.5)
Monocytes Relative: 7 %
Neutro Abs: 2873 cells/uL (ref 1500–7800)
Neutrophils Relative %: 54.2 %
Platelets: 170 10*3/uL (ref 140–400)
RBC: 3.91 10*6/uL (ref 3.80–5.10)
RDW: 11.8 % (ref 11.0–15.0)
Total Lymphocyte: 36.1 %
WBC: 5.3 10*3/uL (ref 3.8–10.8)

## 2021-01-28 LAB — HIV-1 RNA QUANT-NO REFLEX-BLD
HIV 1 RNA Quant: NOT DETECTED Copies/mL
HIV-1 RNA Quant, Log: NOT DETECTED Log cps/mL

## 2021-01-31 ENCOUNTER — Other Ambulatory Visit: Payer: Self-pay | Admitting: Internal Medicine

## 2021-02-01 ENCOUNTER — Other Ambulatory Visit: Payer: Self-pay | Admitting: Internal Medicine

## 2021-02-01 ENCOUNTER — Other Ambulatory Visit (HOSPITAL_COMMUNITY): Payer: Self-pay

## 2021-02-01 MED ORDER — ROSUVASTATIN CALCIUM 10 MG PO TABS
ORAL_TABLET | Freq: Every day | ORAL | 11 refills | Status: DC
  Filled 2021-02-01: qty 30, 30d supply, fill #0
  Filled 2021-03-09: qty 30, 30d supply, fill #1
  Filled 2021-04-12: qty 30, 30d supply, fill #2
  Filled 2021-05-10: qty 30, 30d supply, fill #3
  Filled 2021-06-09: qty 30, 30d supply, fill #4

## 2021-02-02 DIAGNOSIS — Z961 Presence of intraocular lens: Secondary | ICD-10-CM | POA: Diagnosis not present

## 2021-02-02 DIAGNOSIS — H33021 Retinal detachment with multiple breaks, right eye: Secondary | ICD-10-CM | POA: Diagnosis not present

## 2021-02-02 DIAGNOSIS — Z21 Asymptomatic human immunodeficiency virus [HIV] infection status: Secondary | ICD-10-CM | POA: Diagnosis not present

## 2021-02-03 ENCOUNTER — Other Ambulatory Visit (HOSPITAL_COMMUNITY): Payer: Self-pay

## 2021-02-09 ENCOUNTER — Ambulatory Visit: Payer: Self-pay

## 2021-02-09 ENCOUNTER — Other Ambulatory Visit: Payer: Self-pay | Admitting: Nurse Practitioner

## 2021-02-09 ENCOUNTER — Ambulatory Visit
Admission: EM | Admit: 2021-02-09 | Discharge: 2021-02-09 | Disposition: A | Discharge status: Home / Self Care | Payer: 59 | Attending: Emergency Medicine | Admitting: Emergency Medicine

## 2021-02-09 ENCOUNTER — Encounter: Payer: Self-pay | Admitting: Emergency Medicine

## 2021-02-09 ENCOUNTER — Ambulatory Visit (INDEPENDENT_AMBULATORY_CARE_PROVIDER_SITE_OTHER): Payer: 59

## 2021-02-09 ENCOUNTER — Other Ambulatory Visit: Payer: Self-pay

## 2021-02-09 DIAGNOSIS — M25571 Pain in right ankle and joints of right foot: Secondary | ICD-10-CM

## 2021-02-09 DIAGNOSIS — N3001 Acute cystitis with hematuria: Secondary | ICD-10-CM | POA: Insufficient documentation

## 2021-02-09 DIAGNOSIS — S82891A Other fracture of right lower leg, initial encounter for closed fracture: Secondary | ICD-10-CM | POA: Diagnosis not present

## 2021-02-09 DIAGNOSIS — M25471 Effusion, right ankle: Secondary | ICD-10-CM | POA: Diagnosis not present

## 2021-02-09 DIAGNOSIS — Z1231 Encounter for screening mammogram for malignant neoplasm of breast: Secondary | ICD-10-CM

## 2021-02-09 DIAGNOSIS — M7989 Other specified soft tissue disorders: Secondary | ICD-10-CM | POA: Diagnosis not present

## 2021-02-09 LAB — POCT URINALYSIS DIP (MANUAL ENTRY)
Bilirubin, UA: NEGATIVE
Glucose, UA: NEGATIVE mg/dL
Ketones, POC UA: NEGATIVE mg/dL
Nitrite, UA: NEGATIVE
Protein Ur, POC: 100 mg/dL — AB
Spec Grav, UA: 1.02 (ref 1.010–1.025)
Urobilinogen, UA: 0.2 E.U./dL
pH, UA: 6 (ref 5.0–8.0)

## 2021-02-09 MED ORDER — NITROFURANTOIN MONOHYD MACRO 100 MG PO CAPS: 100.0000 mg | ORAL_CAPSULE | Freq: Two times a day (BID) | ORAL | 0 refills | Status: DC

## 2021-02-09 NOTE — Discharge Instructions (Addendum)
Take the antibiotic as directed.  The urine culture is pending.  We will call you if it shows the need to change or discontinue your antibiotic.    Take ibuprofen as prescribed.  Rest and elevate your ankle.  Apply ice packs 2-3 times a day for up to 20 minutes each.  Wear the ankle splint and use the crutches.  Follow up with an orthopedist such as the one listed below.

## 2021-02-09 NOTE — ED Provider Notes (Signed)
Cynthia [name]    CSN: [id number] Arrival date & time: 02/09/21  0803      History   Chief Complaint Chief Complaint  Patient presents with   Urinary Frequency   Dysuria   Ankle Injury     HPI [name] is a 55 y.o. female.  Patient presents with 2-day history of dysuria and urinary frequency.  She denies fever, chills, abdominal pain, flank pain, vaginal discharge, pelvic pain.  No treatment attempted.  She also reports right ankle pain and swelling after "rolling" her ankle yesterday.  She states she stepped off a porch and rolled her ankle.  She denies numbness, weakness, paresthesias, wounds, redness, bruising, or other symptoms.  Treatment attempted at home with use of crutches and ankle brace.  She had a fracture of her right ankle many years ago.  Her medical history includes hypertension, GERD, HIV.  The history is provided by the patient and medical records.   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 Authorizing Provider  bictegravir-emtricitabine-tenofovir AF (BIKTARVY)  50-200-25 MG TABS tablet TAKE 1 TABLET BY MOUTH DAILY. *NEED OFFICE VISIT* 08/10/20 08/10/21 Yes Carlyle Basques, MD  Cholecalciferol (VITAMIN D) 2000 UNITS tablet Take 2,000 Units by mouth daily.   Yes [provider]  lisinopril (ZESTRIL) 10 MG tablet TAKE 1 TABLET BY MOUTH DAILY 11/03/20 11/03/21 Yes Carlyle Basques, MD  nitrofurantoin, macrocrystal-monohydrate, (MACROBID) 100 MG capsule Take 1 capsule (100 mg total) by mouth 2 (two) times daily. 02/09/21  Yes Sharion Balloon, NP  rosuvastatin (CRESTOR) 10 MG tablet Take 1 tablet by mouth daily. 02/01/21 02/01/22 Yes Carlyle Basques, MD  cetirizine (ZYRTEC) 10 MG tablet Take 10 mg by mouth daily.    [provider]  doravirine (PIFELTRO) 100 MG TABS tablet Take 1 tablet (100 mg total) by mouth daily. 01/26/21   Carlyle Basques, MD  emtricitabine-tenofovir AF (DESCOVY) 200-25 MG tablet Take 1 tablet by mouth daily. 01/26/21   Carlyle Basques, MD  ondansetron (ZOFRAN ODT) 4 MG disintegrating tablet Take 1 tablet (4 mg total) by mouth every 8 (eight) hours as needed for nausea or vomiting. Patient  not taking: No sig reported 11/14/20   Mar Daring, PA-C    Family History Family History  Problem Relation Age of Onset   Diabetes Father    Hypertension Father    Hypertension Mother    Cancer Maternal Grandmother        COLON   Heart disease Maternal Grandmother    Colon cancer Maternal Grandmother    Breast cancer Paternal Grandmother    Diabetes Paternal Grandmother    Colon cancer Cousin    Esophageal cancer Neg Hx    Stomach cancer Neg Hx    Rectal cancer Neg Hx     Social History Social History   Tobacco Use   Smoking status: Former    Pack years: 0.00   Smokeless tobacco: Never   Tobacco comments:    Quit 1996  Vaping Use   Vaping Use: Never used  Substance Use Topics   Alcohol use: Yes    Alcohol/week: 0.0 standard drinks    Comment: socially   Drug use: No     Allergies   Dapsone, Penicillins, and  Amoxicillin   Review of Systems Review of Systems  Constitutional:  Negative for chills and fever.  Respiratory:  Negative for cough and shortness of breath.   Cardiovascular:  Negative for chest pain and palpitations.  Gastrointestinal:  Negative for abdominal pain and vomiting.  Genitourinary:  Positive for dysuria and frequency. Negative for flank pain, hematuria, pelvic pain and vaginal discharge.  Musculoskeletal:  Positive for arthralgias, gait problem and joint swelling.  Skin:  Negative for color change and rash.  Neurological:  Negative for weakness and numbness.  All other systems reviewed and are negative.   Physical Exam Triage Vital Signs ED Triage Vitals  Enc Vitals Group     BP      Pulse      Resp      Temp      Temp src      SpO2      Weight      Height      Head Circumference      Peak Flow      Pain Score      Pain Loc      Pain Edu?      Excl. in [place]?    No data found.  Updated Vital Signs BP 112/76 (BP Location: Left Arm)   Pulse 90   Temp 98.7 F (37.1 C) (Oral)   Resp 14   LMP 05/07/2019   SpO2 98%   Visual Acuity Right Eye Distance:   Left Eye Distance:   Bilateral Distance:    Right Eye Near:   Left Eye Near:    Bilateral Near:     Physical Exam Vitals and nursing note reviewed.  Constitutional:      General: She is not in acute distress.    Appearance: She is well-developed.  HENT:     Head: Normocephalic and atraumatic.     Mouth/Throat:     Mouth: Mucous membranes are moist.  Eyes:     Conjunctiva/sclera: Conjunctivae normal.  Cardiovascular:     Rate and Rhythm: Normal rate and regular rhythm.     Heart sounds: No murmur heard. Pulmonary:     Effort: Pulmonary effort is normal. No respiratory distress.     Breath sounds: Normal breath sounds.  Abdominal:     Palpations: Abdomen is soft.     Tenderness: There is no abdominal tenderness. There is no right  CVA tenderness, left CVA tenderness, guarding or rebound.   Musculoskeletal:        General: Swelling and tenderness present. No deformity.     Cervical back: Neck supple.       Feet:  Skin:    General: Skin is warm and dry.     Capillary Refill: Capillary refill takes less than 2 seconds.     Findings: No erythema or lesion.  Neurological:     General: No focal deficit present.     Mental Status: She is alert and oriented to person, place, and time.     Sensory: No sensory deficit.     Motor: No weakness.     Gait: Gait abnormal.  Psychiatric:        Mood and Affect: Mood normal.        Behavior: Behavior normal.     UC Treatments / Results  Labs (all labs ordered are listed, but only abnormal results are displayed) Labs Reviewed  POCT URINALYSIS DIP (MANUAL ENTRY) - Abnormal; Notable for the following components:      Result Value   Blood, UA moderate (*)    Protein Ur, POC =100 (*)    Leukocytes, UA Large (3+) (*)    All other components within normal limits  URINE CULTURE    EKG   Radiology DG Ankle Complete Right  Result Date: 02/09/2021 CLINICAL DATA:  Ankle injury. EXAM: RIGHT ANKLE - COMPLETE 3+ VIEW COMPARISON:  No recent. FINDINGS: Soft tissue swelling noted the lateral malleolus. Tiny fracture noted of the distal tip of the lateral malleolus. Medial malleolus intact. No other focal bony abnormality identified. IMPRESSION: Tiny fracture noted of the distal tip of the lateral malleolus. Electronically Signed   By: Marcello Moores  Register   On: 02/09/2021 09:04    Procedures Procedures (including critical care time)  Medications Ordered in UC Medications - No data to display  Initial Impression / Assessment and Plan / UC Course  I have reviewed the triage vital signs and the nursing notes.  Pertinent labs & imaging results that were available during my care of the patient were reviewed by me and considered in my medical decision making (see chart for details).  Acute cystitis with hematuria.  Treating with Macrobid.  Urine culture pending. Discussed with patient that we will call her if the urine culture shows the need to change or discontinue the antibiotic. Instructed her to follow-up with her PCP if her symptoms are not improving.      Fracture of lateral malleolus.  Treating with ibuprofen, rest, elevation, ice packs, walking boot.  Patient has her own crutches.  She declined prescription for ibuprofen and states she will use OTC.  Instructed her to follow-up with an orthopedist.  Contact information for Dr. Mack Guise provided as he is on-call today.  fracPatient agrees to plan of care.      Final Clinical Impressions(s) / UC Diagnoses   Final diagnoses:  Acute cystitis with hematuria  Closed fracture of malleolus of right ankle, initial encounter     Discharge Instructions      Take the antibiotic as directed.  The urine culture is pending.  We will call you if it shows the need to change or discontinue your antibiotic.    Take ibuprofen as prescribed.  Rest and elevate your ankle.  Apply ice packs 2-3 times a day for up to 20 minutes each.  Wear the ankle splint and use the crutches.  Follow up with an orthopedist  such as the one listed below.         ED Prescriptions     Medication Sig Dispense Auth. Provider   nitrofurantoin, macrocrystal-monohydrate, (MACROBID) 100 MG capsule Take 1 capsule (100 mg total) by mouth 2 (two) times daily. 10 capsule Sharion Balloon, NP      I have reviewed the PDMP during this encounter.   Sharion Balloon, NP 02/09/21 [phone]

## 2021-02-09 NOTE — ED Triage Notes (Signed)
Patient c/o urinary frequency and dysuria started 2 days ago.   Patient endorses burning with urination, ABD pressure, and bladder spasms.  Patient denies fever, back pain, and abnormal vaginal discharge.   Patient hasn't tried any medications for symptoms.   Patient c/o RT ankle injury that occurred yesterday.  Patient states " my ankle rolled on a 2 foot high back porch stoop".   Patient endorses increased pain with ambulation.   Patient endorses a previous fracture in same ankle 30 years ago.   Patient is using crutches for support with ambulation.

## 2021-02-11 LAB — URINE CULTURE: Culture: >=100000 — AB

## 2021-02-13 ENCOUNTER — Other Ambulatory Visit (HOSPITAL_BASED_OUTPATIENT_CLINIC_OR_DEPARTMENT_OTHER): Payer: Self-pay

## 2021-02-13 ENCOUNTER — Other Ambulatory Visit: Payer: Self-pay

## 2021-02-13 ENCOUNTER — Emergency Department (HOSPITAL_BASED_OUTPATIENT_CLINIC_OR_DEPARTMENT_OTHER)
Admission: EM | Admit: 2021-02-13 | Discharge: 2021-02-13 | Disposition: A | Discharge status: Home / Self Care | Payer: 59 | Attending: Emergency Medicine | Admitting: Emergency Medicine

## 2021-02-13 ENCOUNTER — Emergency Department (HOSPITAL_BASED_OUTPATIENT_CLINIC_OR_DEPARTMENT_OTHER): Payer: 59 | Admitting: Radiology

## 2021-02-13 ENCOUNTER — Emergency Department (HOSPITAL_BASED_OUTPATIENT_CLINIC_OR_DEPARTMENT_OTHER): Payer: 59

## 2021-02-13 DIAGNOSIS — W57XXXA Bitten or stung by nonvenomous insect and other nonvenomous arthropods, initial encounter: Secondary | ICD-10-CM

## 2021-02-13 DIAGNOSIS — R109 Unspecified abdominal pain: Secondary | ICD-10-CM | POA: Diagnosis not present

## 2021-02-13 DIAGNOSIS — K219 Gastro-esophageal reflux disease without esophagitis: Secondary | ICD-10-CM | POA: Diagnosis not present

## 2021-02-13 DIAGNOSIS — R519 Headache, unspecified: Secondary | ICD-10-CM | POA: Diagnosis not present

## 2021-02-13 DIAGNOSIS — Z8616 Personal history of COVID-19: Secondary | ICD-10-CM | POA: Diagnosis not present

## 2021-02-13 DIAGNOSIS — Z79899 Other long term (current) drug therapy: Secondary | ICD-10-CM | POA: Insufficient documentation

## 2021-02-13 DIAGNOSIS — I10 Essential (primary) hypertension: Secondary | ICD-10-CM | POA: Diagnosis not present

## 2021-02-13 DIAGNOSIS — R112 Nausea with vomiting, unspecified: Secondary | ICD-10-CM | POA: Diagnosis not present

## 2021-02-13 DIAGNOSIS — Z21 Asymptomatic human immunodeficiency virus [HIV] infection status: Secondary | ICD-10-CM | POA: Insufficient documentation

## 2021-02-13 DIAGNOSIS — N200 Calculus of kidney: Secondary | ICD-10-CM | POA: Diagnosis not present

## 2021-02-13 DIAGNOSIS — M545 Low back pain, unspecified: Secondary | ICD-10-CM | POA: Insufficient documentation

## 2021-02-13 DIAGNOSIS — R509 Fever, unspecified: Secondary | ICD-10-CM

## 2021-02-13 DIAGNOSIS — S30861A Insect bite (nonvenomous) of abdominal wall, initial encounter: Secondary | ICD-10-CM | POA: Diagnosis not present

## 2021-02-13 DIAGNOSIS — Z87891 Personal history of nicotine dependence: Secondary | ICD-10-CM | POA: Insufficient documentation

## 2021-02-13 DIAGNOSIS — Z20822 Contact with and (suspected) exposure to covid-19: Secondary | ICD-10-CM | POA: Diagnosis not present

## 2021-02-13 DIAGNOSIS — R000 Tachycardia, unspecified: Secondary | ICD-10-CM | POA: Diagnosis not present

## 2021-02-13 LAB — COMPREHENSIVE METABOLIC PANEL
ALT: 15 U/L (ref 0–44)
AST: 15 U/L (ref 15–41)
Albumin: 3.7 g/dL (ref 3.5–5.0)
Alkaline Phosphatase: 52 U/L (ref 38–126)
Anion gap: 13 (ref 5–15)
BUN: 26 mg/dL — ABNORMAL HIGH (ref 6–20)
CO2: 20 mmol/L — ABNORMAL LOW (ref 22–32)
Calcium: 8.9 mg/dL (ref 8.9–10.3)
Chloride: 102 mmol/L (ref 98–111)
Creatinine, Ser: 1.45 mg/dL — ABNORMAL HIGH (ref 0.44–1.00)
GFR, Estimated: 43 mL/min — ABNORMAL LOW (ref >60)
Glucose, Bld: 130 mg/dL — ABNORMAL HIGH (ref 70–99)
Potassium: 3.9 mmol/L (ref 3.5–5.1)
Sodium: 135 mmol/L (ref 135–145)
Total Bilirubin: 0.7 mg/dL (ref 0.3–1.2)
Total Protein: 6.6 g/dL (ref 6.5–8.1)

## 2021-02-13 LAB — CBC WITH DIFFERENTIAL/PLATELET
Abs Immature Granulocytes: 0.01 10*3/uL (ref 0.00–0.07)
Basophils Absolute: 0 10*3/uL (ref 0.0–0.1)
Basophils Relative: 0 %
Eosinophils Absolute: 0.3 10*3/uL (ref 0.0–0.5)
Eosinophils Relative: 4 %
HCT: 32.7 % — ABNORMAL LOW (ref 36.0–46.0)
Hemoglobin: 11.4 g/dL — ABNORMAL LOW (ref 12.0–15.0)
Immature Granulocytes: 0 %
Lymphocytes Relative: 3 %
Lymphs Abs: 0.2 10*3/uL — ABNORMAL LOW (ref 0.7–4.0)
MCH: 32.1 pg (ref 26.0–34.0)
MCHC: 34.9 g/dL (ref 30.0–36.0)
MCV: 92.1 fL (ref 80.0–100.0)
Monocytes Absolute: 0.4 10*3/uL (ref 0.1–1.0)
Monocytes Relative: 5 %
Neutro Abs: 6.6 10*3/uL (ref 1.7–7.7)
Neutrophils Relative %: 88 %
Platelets: 160 10*3/uL (ref 150–400)
RBC: 3.55 MIL/uL — ABNORMAL LOW (ref 3.87–5.11)
RDW: 11.8 % (ref 11.5–15.5)
WBC: 7.5 10*3/uL (ref 4.0–10.5)
nRBC: 0 % (ref 0.0–0.2)

## 2021-02-13 LAB — PROTIME-INR
INR: 1.1 (ref 0.8–1.2)
Prothrombin Time: 13.8 seconds (ref 11.4–15.2)

## 2021-02-13 LAB — RESP PANEL BY RT-PCR (FLU A&B, COVID) ARPGX2
Influenza A by PCR: NEGATIVE
Influenza B by PCR: NEGATIVE
SARS Coronavirus 2 by RT PCR: NEGATIVE

## 2021-02-13 LAB — LACTIC ACID, PLASMA: Lactic Acid, Venous: 0.9 mmol/L (ref 0.5–1.9)

## 2021-02-13 LAB — URINALYSIS, ROUTINE W REFLEX MICROSCOPIC
Bilirubin Urine: NEGATIVE
Glucose, UA: NEGATIVE mg/dL
Ketones, ur: 15 mg/dL — AB
Nitrite: NEGATIVE
Protein, ur: 100 mg/dL — AB
Specific Gravity, Urine: 1.019 (ref 1.005–1.030)
pH: 6 (ref 5.0–8.0)

## 2021-02-13 MED ORDER — ONDANSETRON HCL 4 MG/2ML IJ SOLN
4.0000 mg | Freq: Once | INTRAMUSCULAR | Status: AC
  Administered 2021-02-13: 4 mg via INTRAVENOUS
  Filled 2021-02-13: qty 2

## 2021-02-13 MED ORDER — SODIUM CHLORIDE 0.9 % IV BOLUS
1000.0000 mL | Freq: Once | INTRAVENOUS | Status: AC
  Administered 2021-02-13: 1000 mL via INTRAVENOUS

## 2021-02-13 MED ORDER — DOXYCYCLINE HYCLATE 100 MG PO CAPS
100.0000 mg | ORAL_CAPSULE | Freq: Two times a day (BID) | ORAL | 0 refills | Status: DC
  Filled 2021-02-13: qty 14, 7d supply, fill #0

## 2021-02-13 MED ORDER — ACETAMINOPHEN 325 MG PO TABS: 650.0000 mg | ORAL_TABLET | Freq: Once | ORAL | Status: DC

## 2021-02-13 NOTE — ED Triage Notes (Addendum)
Pt to ED from home with c/o left sided abd pain/fever x 3 days. Pt reports a 104.3 fever at home with progressively worsening pain which brings her to the ER this morning. Pt states she is being treated for a UTI and today was the last day of her anti-biotic (macrobid). Pt is HIV positive but states her viral load is undetectable. Pt took 1g of tylenol at 0450 this morning.

## 2021-02-13 NOTE — ED Provider Notes (Signed)
Patient care transferred to me.  CT scan is unrevealing except for a nonobstructing stone in the upper kidney.  No clear cause for her fever.  Her urine symptoms seem to have resolved and so I think unresolved UTI or pyelonephritis is less likely.  Her labs are reassuring.  Her vital signs are much better.  Headache is a prominent part of her symptoms but is unclear if this is related to the fever or something like a tickborne illness given the recent tick finding.  Meningitis/encephalitis seems unlikely given her appearance currently.  We discussed return precautions and plan to follow-up with PCP in the next 48 hours if not improving versus coming back sooner if he gets worse.  She does have some nonspecific back pain but no other symptoms and seemingly well-controlled HIV so I do not think an emergent MRI is necessarily warranted as spinal cord infection seems less likely.  Overall, we counseled on the strict return precautions and following up.  We will send doxycycline as per prior plan.   Sherwood Gambler, MD 02/13/21 [phone]

## 2021-02-13 NOTE — ED Provider Notes (Signed)
[facility] EMERGENCY DEPT Provider Note   CSN: [id number] Arrival date & time: 02/13/21  6503     History Chief Complaint  Patient presents with   Abdominal Pain   Fever    COREEN SHIPPEE is a 55 y.o. female.  HPI     This is a 55 year old female with a history of HIV, hypertension who presents with fever, abdominal pain, back pain, nausea, vomiting, and headache.  Patient reports onset of symptoms on Friday.  She was seen and evaluated on 7/7 and found to have a UTI.  At that time she had dysuria which has resolved.  She has 1 day left for Macrobid.  She states that since Friday she has had fever.  Most recently fever was up to 104.3 at home.  She took Tylenol around 445.  Patient describes left-sided abdominal discomfort and back pain that goes all the way across her back.  Additionally she reports nonbilious, nonbloody emesis.  She reports loose stools.  No cough or upper respiratory symptoms.  No known sick contacts with COVID.  She does report that her grandchildren had some sort of viral illness.  She does report that she removed a tick 1 week ago.  She has not noted any rash.  Urgent care note reviewed from prior visit.  Urinalysis and urine culture reviewed.  Urine culture grew out E. coli sensitive to Macrobid.  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[place])    Hypertension    HIV DISEASE 11/07/2006   DISEASE, HYPERTENSIVE HEART, BENIGN, W/HF 11/07/2006    Past Surgical History:  Procedure Laterality Date   EYE SURGERY     Six surgeries   FINGER SURGERY  05/2010   "SCREWS IN AND OUT OF FINGER"   LASER VAPORIZATION OF OF CONDYLOMA     MANDIBLE SURGERY     NOSE SURGERY        OB History     Gravida  4   Para  1   Term      Preterm      AB  3   Living  1      SAB  3   IAB      Ectopic      Multiple      Live Births              Family History  Problem Relation Age of Onset   Diabetes Father    Hypertension Father    Hypertension Mother    Cancer Maternal Grandmother        COLON   Heart disease Maternal Grandmother    Colon cancer Maternal Grandmother    Breast cancer Paternal Grandmother    Diabetes Paternal Grandmother    Colon cancer Cousin    Esophageal cancer Neg Hx    Stomach cancer Neg Hx    Rectal cancer Neg Hx     Social History   Tobacco Use   Smoking status: Former    Pack years: 0.00   Smokeless tobacco: Never   Tobacco comments:    Quit 1996  Vaping Use   Vaping Use: Never used  Substance  Use Topics   Alcohol use: Yes    Alcohol/week: 0.0 standard drinks    Comment: socially   Drug use: No    Home Medications Prior to Admission medications   Medication Sig Start Date End Date Taking? Authorizing Provider  bictegravir-emtricitabine-tenofovir AF (BIKTARVY) 50-200-25 MG TABS tablet TAKE 1 TABLET BY MOUTH DAILY. *NEED OFFICE VISIT* 08/10/20 08/10/21  Carlyle Basques, MD  cetirizine (ZYRTEC) 10 MG tablet Take 10 mg by mouth daily.    [provider]  Cholecalciferol (VITAMIN D) 2000 UNITS tablet Take 2,000 Units by mouth daily.    [provider]  doravirine (PIFELTRO) 100 MG TABS tablet Take 1 tablet (100 mg total) by mouth daily. 01/26/21   Carlyle Basques, MD  emtricitabine-tenofovir AF (DESCOVY) 200-25 MG tablet Take 1 tablet by mouth daily. 01/26/21   Carlyle Basques, MD  lisinopril (ZESTRIL) 10 MG tablet TAKE 1 TABLET BY MOUTH DAILY 11/03/20 11/03/21  Carlyle Basques, MD  nitrofurantoin, macrocrystal-monohydrate, (MACROBID) 100 MG capsule Take 1 capsule (100 mg total) by mouth 2 (two) times daily. 02/09/21   Sharion Balloon, NP  ondansetron (ZOFRAN ODT) 4 MG disintegrating tablet Take 1  tablet (4 mg total) by mouth every 8 (eight) hours as needed for nausea or vomiting. Patient not taking: No sig reported 11/14/20   Mar Daring, PA-C  rosuvastatin (CRESTOR) 10 MG tablet Take 1 tablet by mouth daily. 02/01/21 02/01/22  Carlyle Basques, MD    Allergies    Dapsone, Penicillins, and Amoxicillin  Review of Systems   Review of Systems  Constitutional:  Positive for fever.  Respiratory:  Negative for cough and shortness of breath.   Cardiovascular:  Negative for chest pain.  Gastrointestinal:  Positive for abdominal pain, diarrhea, nausea and vomiting.  Genitourinary:  Negative for dysuria.  Musculoskeletal:  Positive for back pain. Negative for neck pain.  Skin:  Negative for rash.  Neurological:  Positive for headaches.  All other systems reviewed and are negative.  Physical Exam Updated Vital Signs BP 101/70   Pulse (!) 105   Temp 100 F (37.8 C) (Oral)   Resp 16   Ht 1.651 m (5\' 5" )   Wt 64 kg   LMP 05/07/2019   SpO2 94%   BMI 23.48 kg/m   Physical Exam Vitals and nursing note reviewed.  Constitutional:      Appearance: She is well-developed. She is not ill-appearing.  HENT:     Head: Normocephalic and atraumatic.     Nose: No congestion.     Mouth/Throat:     Mouth: Mucous membranes are moist.  Eyes:     Pupils: Pupils are equal, round, and reactive to light.  Neck:     Comments: No meningismus Cardiovascular:     Rate and Rhythm: Regular rhythm. Tachycardia present.     Heart sounds: Normal heart sounds.  Pulmonary:     Effort: Pulmonary effort is normal. No respiratory distress.     Breath sounds: No wheezing.  Abdominal:     General: Bowel sounds are normal.     Palpations: Abdomen is soft.     Tenderness: There is no abdominal tenderness. There is no right CVA tenderness or left CVA tenderness.  Musculoskeletal:     Cervical back: Normal range of motion and neck supple.  Skin:    General: Skin is warm and dry.  Neurological:      Mental Status: She is alert and oriented to person, place, and time.  Psychiatric:  Mood and Affect: Mood normal.    ED Results / Procedures / Treatments   Labs (all labs ordered are listed, but only abnormal results are displayed) Labs Reviewed  COMPREHENSIVE METABOLIC PANEL - Abnormal; Notable for the following components:      Result Value   CO2 20 (*)    Glucose, Bld 130 (*)    BUN 26 (*)    Creatinine, Ser 1.45 (*)    GFR, Estimated 43 (*)    All other components within normal limits  CBC WITH DIFFERENTIAL/PLATELET - Abnormal; Notable for the following components:   RBC 3.55 (*)    Hemoglobin 11.4 (*)    HCT 32.7 (*)    Lymphs Abs 0.2 (*)    All other components within normal limits  URINALYSIS, ROUTINE W REFLEX MICROSCOPIC - Abnormal; Notable for the following components:   Hgb urine dipstick MODERATE (*)    Ketones, ur 15 (*)    Protein, ur 100 (*)    Leukocytes,Ua SMALL (*)    All other components within normal limits  RESP PANEL BY RT-PCR (FLU A&B, COVID) ARPGX2  CULTURE, BLOOD (ROUTINE X 2)  CULTURE, BLOOD (ROUTINE X 2)  URINE CULTURE  LACTIC ACID, PLASMA  PROTIME-INR  LACTIC ACID, PLASMA  ROCKY MTN SPOTTED FVR ABS PNL(IGG+IGM)    EKG None  Radiology DG Chest 1 View  Result Date: 02/13/2021 CLINICAL DATA:  Left-sided abdominal pain and fever for 3 days EXAM: CHEST  1 VIEW COMPARISON:  None. FINDINGS: Interstitial coarsening without Kerley lines or focal opacity. Normal heart size and mediastinal contours. No effusion or pneumothorax. IMPRESSION: Interstitial coarsening which could reflect bronchitis. No focal pneumonia. Electronically Signed   By: Monte Fantasia M.D.   On: 02/13/2021 06:12    Procedures Procedures   Medications Ordered in ED Medications  ondansetron (ZOFRAN) injection 4 mg (4 mg Intravenous Given 02/13/21 0618)  sodium chloride 0.9 % bolus 1,000 mL (1,000 mLs Intravenous New Bag/Given 02/13/21 0618)    ED Course  I have reviewed  the triage vital signs and the nursing notes.  Pertinent labs & imaging results that were available during my care of the patient were reviewed by me and considered in my medical decision making (see chart for details).    MDM Rules/Calculators/A&P                          Patient presents with fever multiple complaints including back pain, abdominal pain, headache, nausea, vomiting.  She is non-ill-appearing.  She is tachycardic and febrile.  Sepsis work-up was initiated.  Potential sources include but not limited to, urine, intra-abdominal pathology, less likely pneumonia as patient denies any URI symptoms.  COVID and influenza testing were sent as well.  Patient does report tick exposure.  No rash noted.  We will send RMSF titers.  Lab work reviewed.  No leukocytosis.  Slight metabolic derangements with bicarb of 20 creatinine of 1.45.  Patient was given fluids.  Lactate normal.  No indication of severe sepsis or septic shock.  Urinalysis with small leukocyte esterase, 6-10 red cells and 21-50 white cells.  Do not feel this clearly indicates UTI.  Urine culture is pending.  We will obtain a CT stone study to rule out kidney stone or other intra-abdominal pathology.  Chest x-ray without pneumothorax or pneumonia.  If no clear etiology, would favor treating for tickborne illness.  On recheck, patient much improved and temperature to 100.  Patient signed  out to oncoming provider.  Final Clinical Impression(s) / ED Diagnoses Final diagnoses:  Fever, unspecified fever cause  Tick bite, unspecified site, initial encounter    Rx / DC Orders ED Discharge Orders     None        Makenize Messman, Barbette Hair, MD 02/13/21 [phone]

## 2021-02-13 NOTE — Discharge Instructions (Addendum)
If you develop continued fever over the next 24-48 hours, new or worsening headache, new or worsening back pain, numbness or weakness in your extremities, vomiting, or any other new/concerning symptoms then return to the ER for evaluation.

## 2021-02-14 LAB — URINE CULTURE: Culture: NO GROWTH

## 2021-02-15 DIAGNOSIS — S8264XA Nondisplaced fracture of lateral malleolus of right fibula, initial encounter for closed fracture: Secondary | ICD-10-CM | POA: Diagnosis not present

## 2021-02-15 DIAGNOSIS — S93409A Sprain of unspecified ligament of unspecified ankle, initial encounter: Secondary | ICD-10-CM | POA: Insufficient documentation

## 2021-02-15 DIAGNOSIS — M25571 Pain in right ankle and joints of right foot: Secondary | ICD-10-CM | POA: Diagnosis not present

## 2021-02-16 ENCOUNTER — Other Ambulatory Visit (HOSPITAL_COMMUNITY): Payer: Self-pay

## 2021-02-16 LAB — ROCKY MTN SPOTTED FVR ABS PNL(IGG+IGM)
RMSF IgG: NEGATIVE
RMSF IgM: 0.35 index (ref 0.00–0.89)

## 2021-02-18 LAB — CULTURE, BLOOD (ROUTINE X 2)
Culture: NO GROWTH
Culture: NO GROWTH
Special Requests: ADEQUATE
Special Requests: ADEQUATE

## 2021-02-26 MED FILL — Lisinopril Tab 10 MG: ORAL | 90 days supply | Qty: 90 | Fill #1 | Status: AC

## 2021-02-27 ENCOUNTER — Other Ambulatory Visit (HOSPITAL_COMMUNITY): Payer: Self-pay

## 2021-03-02 DIAGNOSIS — M25572 Pain in left ankle and joints of left foot: Secondary | ICD-10-CM | POA: Diagnosis not present

## 2021-03-10 ENCOUNTER — Other Ambulatory Visit (HOSPITAL_COMMUNITY): Payer: Self-pay

## 2021-03-14 ENCOUNTER — Other Ambulatory Visit (HOSPITAL_COMMUNITY): Payer: Self-pay

## 2021-03-15 DIAGNOSIS — S8264XD Nondisplaced fracture of lateral malleolus of right fibula, subsequent encounter for closed fracture with routine healing: Secondary | ICD-10-CM | POA: Diagnosis not present

## 2021-04-04 ENCOUNTER — Ambulatory Visit
Admission: RE | Admit: 2021-04-04 | Discharge: 2021-04-04 | Disposition: A | Discharge status: Home / Self Care | Payer: 59 | Source: Ambulatory Visit | Attending: Nurse Practitioner | Admitting: Nurse Practitioner

## 2021-04-04 ENCOUNTER — Other Ambulatory Visit: Payer: Self-pay

## 2021-04-04 DIAGNOSIS — Z1231 Encounter for screening mammogram for malignant neoplasm of breast: Secondary | ICD-10-CM | POA: Diagnosis not present

## 2021-04-11 ENCOUNTER — Ambulatory Visit: Payer: 59 | Admitting: Internal Medicine

## 2021-04-11 ENCOUNTER — Encounter: Payer: Self-pay | Admitting: Internal Medicine

## 2021-04-11 ENCOUNTER — Other Ambulatory Visit: Payer: Self-pay

## 2021-04-11 VITALS — BP 112/74 | HR 84 | Temp 98.0°F | Ht 65.0 in | Wt 137.0 lb

## 2021-04-11 DIAGNOSIS — S0006XS Insect bite (nonvenomous) of scalp, sequela: Secondary | ICD-10-CM

## 2021-04-11 DIAGNOSIS — W57XXXS Bitten or stung by nonvenomous insect and other nonvenomous arthropods, sequela: Secondary | ICD-10-CM | POA: Diagnosis not present

## 2021-04-11 DIAGNOSIS — N179 Acute kidney failure, unspecified: Secondary | ICD-10-CM | POA: Diagnosis not present

## 2021-04-11 DIAGNOSIS — Z79899 Other long term (current) drug therapy: Secondary | ICD-10-CM | POA: Diagnosis not present

## 2021-04-11 DIAGNOSIS — B20 Human immunodeficiency virus [HIV] disease: Secondary | ICD-10-CM

## 2021-04-11 NOTE — Progress Notes (Signed)
Patient ID: Cynthia [name], female   DOB: 1966/05/13, 55 y.o.   MRN: [id number]  HPI Cynthia [name] is a 55yo F with well controlled HIV disease, HTN, when we last saw her in clinic, we changed her to doravarine-descovy. She is tolerating the new regimen. In the beginning of July, she had found a large tick on her scalp on Tuesday (July 8th?) then subsequently had fever, headache, abdominal pain and rash 4 days later. Treated for presumed RMSF. Sicker than when she had covid. Felt sick for roughly 10-14 days. Incidentally found small kidney stone at that time of work up. She is now back to her baseline health. .  That week she also had rolled ankle, and sustained a small lateral malleolus fracture.   Noticing that she is getting gerd with doravirine but now improved if you take dinner time (no later than 7pm)   Outpatient Encounter Medications as of 04/11/2021  Medication Sig   cetirizine (ZYRTEC) 10 MG tablet Take 10 mg by mouth daily.   Cholecalciferol (VITAMIN D) 2000 UNITS tablet Take 2,000 Units by mouth daily.   doravirine (PIFELTRO) 100 MG TABS tablet Take 1 tablet (100 mg total) by mouth daily.   doxycycline (VIBRAMYCIN) 100 MG capsule Take 1 capsule (100 mg total) by mouth 2 (two) times daily.   emtricitabine-tenofovir AF (DESCOVY) 200-25 MG tablet Take 1 tablet by mouth daily.   lisinopril (ZESTRIL) 10 MG tablet TAKE 1 TABLET BY MOUTH DAILY   nitrofurantoin, macrocrystal-monohydrate, (MACROBID) 100 MG capsule Take 1 capsule (100 mg total) by mouth 2 (two) times daily.   ondansetron (ZOFRAN ODT) 4 MG disintegrating tablet Take 1 tablet (4 mg total) by mouth every 8 (eight) hours as needed for nausea or vomiting. (Patient not taking: No sig reported)   rosuvastatin (CRESTOR) 10 MG tablet Take 1 tablet by mouth daily.   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  HENT: Negative for congestion, sore throat, rhinorrhea, sneezing, trouble swallowing and sinus pressure.  Eyes: Negative for photophobia and visual disturbance.  Respiratory: Negative for cough, chest tightness, shortness of breath, wheezing and stridor.  Cardiovascular: Negative for chest pain, palpitations and leg swelling.  Gastrointestinal: Negative for nausea, vomiting, abdominal pain, diarrhea, constipation, blood in stool, abdominal distention and anal bleeding.  Genitourinary: Negative for dysuria, hematuria, flank pain and difficulty urinating.  Musculoskeletal: Negative for myalgias, back pain, joint swelling, arthralgias and gait problem.  Skin: Negative for color change, pallor, rash and wound.  Neurological: Negative for dizziness, tremors, weakness and light-headedness.  Hematological: Negative for adenopathy. Does not bruise/bleed easily.  Psychiatric/Behavioral: Negative for behavioral problems, confusion, sleep disturbance, dysphoric mood, decreased concentration and agitation.   Physical Exam  BP 112/74   Pulse 84   Temp 98  F (36.7 C)   Ht '5\' 5"'$  (1.651 m)   Wt 137 lb (62.1 kg)   LMP 05/07/2019   BMI 22.80 kg/m  Physical Exam  Constitutional:  oriented to person, place, and time. appears well-developed and well-nourished. No distress.  HENT: [place]/AT, PERRLA, no scleral icterus Mouth/Throat: Oropharynx  is clear and moist. No oropharyngeal exudate.  Cardiovascular: Normal rate, regular rhythm and normal heart sounds. Exam reveals no gallop and no friction rub.  No murmur heard.  Pulmonary/Chest: Effort normal and breath sounds normal. No respiratory distress.  has no wheezes.  Neck = supple, no nuchal rigidity Abdominal: Soft. Bowel sounds are normal.  exhibits no distension. There is no tenderness.  Lymphadenopathy: no cervical adenopathy. No axillary adenopathy Neurological: alert and oriented to person, place, and time.  Skin: Skin is warm and dry. No rash noted. No erythema.  Psychiatric: a normal mood and affect.  behavior is normal.    Lab Results  Component Value Date   CD4TCELL 46 01/26/2021   Lab Results  Component Value Date   CD4TABS 890 01/26/2021   CD4TABS 601 07/27/2020   CD4TABS 698 01/12/2020   Lab Results  Component Value Date   HIV1RNAQUANT Not Detected 01/26/2021   Lab Results  Component Value Date   HEPBSAB No 09/30/2006   Lab Results  Component Value Date   LABRPR NON-REACTIVE 07/27/2020    CBC Lab Results  Component Value Date   WBC 7.5 02/13/2021   RBC 3.55 (L) 02/13/2021   HGB 11.4 (L) 02/13/2021   HCT 32.7 (L) 02/13/2021   PLT 160 02/13/2021   MCV 92.1 02/13/2021   MCH 32.1 02/13/2021   MCHC 34.9 02/13/2021   RDW 11.8 02/13/2021   LYMPHSABS 0.2 (L) 02/13/2021   MONOABS 0.4 02/13/2021   EOSABS 0.3 02/13/2021    BMET Lab Results  Component Value Date   NA 135 02/13/2021   K 3.9 02/13/2021   CL 102 02/13/2021   CO2 20 (L) 02/13/2021   GLUCOSE 130 (H) 02/13/2021   BUN 26 (H) 02/13/2021   CREATININE 1.45 (H) 02/13/2021   CALCIUM 8.9 02/13/2021   GFRNONAA 43 (L) 02/13/2021   GFRAA 54 (L) 01/26/2021      Assessment and Plan  HIV disease= will check labs since she has been changed to a new regimen - will check cd 4 count/VL -   AKI = likely from febrile events leading to going to ED visit. Will repeat cr function. Will check  cr function as part of new regimen as well  RMSF, presumed = will check RMSF ab- convalescent serology. Clinically consistent with tick-borne illness.

## 2021-04-12 ENCOUNTER — Other Ambulatory Visit (HOSPITAL_COMMUNITY): Payer: Self-pay

## 2021-04-12 LAB — T-HELPER CELL (CD4) - (RCID CLINIC ONLY)
CD4 % Helper T Cell: 49 % (ref 33–65)
CD4 T Cell Abs: 701 /uL (ref 400–1790)

## 2021-04-13 ENCOUNTER — Other Ambulatory Visit (HOSPITAL_COMMUNITY): Payer: Self-pay

## 2021-04-17 LAB — COMPLETE METABOLIC PANEL WITH GFR
AG Ratio: 1.9 (calc) (ref 1.0–2.5)
ALT: 15 U/L (ref 6–29)
AST: 22 U/L (ref 10–35)
Albumin: 4.6 g/dL (ref 3.6–5.1)
Alkaline phosphatase (APISO): 46 U/L (ref 37–153)
BUN/Creatinine Ratio: 23 (calc) — ABNORMAL HIGH (ref 6–22)
BUN: 28 mg/dL — ABNORMAL HIGH (ref 7–25)
CO2: 27 mmol/L (ref 20–32)
Calcium: 9.6 mg/dL (ref 8.6–10.4)
Chloride: 104 mmol/L (ref 98–110)
Creat: 1.23 mg/dL — ABNORMAL HIGH (ref 0.50–1.03)
Globulin: 2.4 g/dL (calc) (ref 1.9–3.7)
Glucose, Bld: 88 mg/dL (ref 65–99)
Potassium: 4.6 mmol/L (ref 3.5–5.3)
Sodium: 138 mmol/L (ref 135–146)
Total Bilirubin: 0.3 mg/dL (ref 0.2–1.2)
Total Protein: 7 g/dL (ref 6.1–8.1)
eGFR: 53 mL/min/{1.73_m2} — ABNORMAL LOW (ref >=60)

## 2021-04-17 LAB — ROCKY MTN SPOTTED FVR ABS PNL(IGG+IGM)
RMSF IgG: NOT DETECTED
RMSF IgM: NOT DETECTED

## 2021-04-17 LAB — HIV-1 RNA QUANT-NO REFLEX-BLD
HIV 1 RNA Quant: NOT DETECTED Copies/mL
HIV-1 RNA Quant, Log: NOT DETECTED Log cps/mL

## 2021-04-17 LAB — CBC WITH DIFFERENTIAL/PLATELET
Absolute Monocytes: 350 cells/uL (ref 200–950)
Basophils Absolute: 32 cells/uL (ref 0–200)
Basophils Relative: 0.6 %
Eosinophils Absolute: 122 cells/uL (ref 15–500)
Eosinophils Relative: 2.3 %
HCT: 34.6 % — ABNORMAL LOW (ref 35.0–45.0)
Hemoglobin: 11.9 g/dL (ref 11.7–15.5)
Lymphs Abs: 1505 cells/uL (ref 850–3900)
MCH: 32.1 pg (ref 27.0–33.0)
MCHC: 34.4 g/dL (ref 32.0–36.0)
MCV: 93.3 fL (ref 80.0–100.0)
MPV: 9.6 fL (ref 7.5–12.5)
Monocytes Relative: 6.6 %
Neutro Abs: 3291 cells/uL (ref 1500–7800)
Neutrophils Relative %: 62.1 %
Platelets: 216 10*3/uL (ref 140–400)
RBC: 3.71 10*6/uL — ABNORMAL LOW (ref 3.80–5.10)
RDW: 12.9 % (ref 11.0–15.0)
Total Lymphocyte: 28.4 %
WBC: 5.3 10*3/uL (ref 3.8–10.8)

## 2021-05-10 ENCOUNTER — Other Ambulatory Visit (HOSPITAL_COMMUNITY): Payer: Self-pay

## 2021-05-11 ENCOUNTER — Other Ambulatory Visit (HOSPITAL_COMMUNITY): Payer: Self-pay

## 2021-05-30 DIAGNOSIS — D225 Melanocytic nevi of trunk: Secondary | ICD-10-CM | POA: Diagnosis not present

## 2021-05-30 DIAGNOSIS — L814 Other melanin hyperpigmentation: Secondary | ICD-10-CM | POA: Diagnosis not present

## 2021-05-30 DIAGNOSIS — L821 Other seborrheic keratosis: Secondary | ICD-10-CM | POA: Diagnosis not present

## 2021-05-30 DIAGNOSIS — L578 Other skin changes due to chronic exposure to nonionizing radiation: Secondary | ICD-10-CM | POA: Diagnosis not present

## 2021-06-04 ENCOUNTER — Other Ambulatory Visit: Payer: Self-pay | Admitting: Internal Medicine

## 2021-06-04 DIAGNOSIS — I10 Essential (primary) hypertension: Secondary | ICD-10-CM

## 2021-06-05 NOTE — Telephone Encounter (Signed)
Okay to refill? 

## 2021-06-06 ENCOUNTER — Other Ambulatory Visit (HOSPITAL_COMMUNITY): Payer: Self-pay

## 2021-06-07 ENCOUNTER — Other Ambulatory Visit (HOSPITAL_COMMUNITY): Payer: Self-pay

## 2021-06-08 ENCOUNTER — Other Ambulatory Visit (HOSPITAL_COMMUNITY): Payer: Self-pay

## 2021-06-08 MED ORDER — LISINOPRIL 10 MG PO TABS
ORAL_TABLET | Freq: Every day | ORAL | 0 refills | Status: DC
  Filled 2021-06-08: qty 90, 90d supply, fill #0

## 2021-06-08 NOTE — Telephone Encounter (Signed)
Patient will establish care with new PCP this month. Please defer future refills to new PCP

## 2021-06-09 ENCOUNTER — Other Ambulatory Visit (HOSPITAL_COMMUNITY): Payer: Self-pay

## 2021-06-14 ENCOUNTER — Other Ambulatory Visit (HOSPITAL_COMMUNITY): Payer: Self-pay

## 2021-06-23 ENCOUNTER — Encounter (HOSPITAL_BASED_OUTPATIENT_CLINIC_OR_DEPARTMENT_OTHER): Payer: Self-pay | Admitting: Nurse Practitioner

## 2021-06-23 ENCOUNTER — Other Ambulatory Visit (HOSPITAL_COMMUNITY): Payer: Self-pay

## 2021-06-23 ENCOUNTER — Ambulatory Visit (INDEPENDENT_AMBULATORY_CARE_PROVIDER_SITE_OTHER): Payer: 59 | Admitting: Nurse Practitioner

## 2021-06-23 ENCOUNTER — Other Ambulatory Visit: Payer: Self-pay

## 2021-06-23 VITALS — BP 122/82 | HR 99 | Ht 65.0 in | Wt 139.6 lb

## 2021-06-23 DIAGNOSIS — K219 Gastro-esophageal reflux disease without esophagitis: Secondary | ICD-10-CM | POA: Diagnosis not present

## 2021-06-23 DIAGNOSIS — M5432 Sciatica, left side: Secondary | ICD-10-CM | POA: Diagnosis not present

## 2021-06-23 DIAGNOSIS — I10 Essential (primary) hypertension: Secondary | ICD-10-CM | POA: Diagnosis not present

## 2021-06-23 DIAGNOSIS — Z21 Asymptomatic human immunodeficiency virus [HIV] infection status: Secondary | ICD-10-CM | POA: Diagnosis not present

## 2021-06-23 DIAGNOSIS — E785 Hyperlipidemia, unspecified: Secondary | ICD-10-CM | POA: Insufficient documentation

## 2021-06-23 DIAGNOSIS — Z0000 Encounter for general adult medical examination without abnormal findings: Secondary | ICD-10-CM

## 2021-06-23 DIAGNOSIS — R635 Abnormal weight gain: Secondary | ICD-10-CM

## 2021-06-23 DIAGNOSIS — E559 Vitamin D deficiency, unspecified: Secondary | ICD-10-CM | POA: Insufficient documentation

## 2021-06-23 MED ORDER — ROSUVASTATIN CALCIUM 10 MG PO TABS
ORAL_TABLET | Freq: Every day | ORAL | 3 refills | Status: DC
  Filled 2021-06-23: qty 90, fill #0
  Filled 2021-07-17: qty 90, 90d supply, fill #0
  Filled 2021-10-22: qty 90, 90d supply, fill #1
  Filled 2022-01-30: qty 90, 90d supply, fill #2
  Filled 2022-05-30: qty 90, 90d supply, fill #3

## 2021-06-23 MED ORDER — PANTOPRAZOLE SODIUM 40 MG PO TBEC
40.0000 mg | DELAYED_RELEASE_TABLET | Freq: Every day | ORAL | 5 refills | Status: DC
  Filled 2021-06-23: qty 30, 30d supply, fill #0

## 2021-06-23 MED ORDER — LISINOPRIL 10 MG PO TABS
ORAL_TABLET | Freq: Every day | ORAL | 3 refills | Status: DC
  Filled 2021-06-23 – 2021-09-05 (×2): qty 90, 90d supply, fill #0
  Filled 2021-12-14: qty 90, 90d supply, fill #1
  Filled 2022-03-27: qty 90, 90d supply, fill #2

## 2021-06-23 MED ORDER — VITAMIN D 50 MCG (2000 UT) PO TABS
2000.0000 [IU] | ORAL_TABLET | Freq: Every day | ORAL | 3 refills | Status: DC
  Filled 2021-06-23: qty 90, 90d supply, fill #0

## 2021-06-23 NOTE — Assessment & Plan Note (Signed)
Currently taking rosuvastatin 10 mg daily and tolerating well. Is been quite sometime since she had a lipid panel performed therefore we will obtain this today. Refills provided for medication today. Follow-up in 1 year.

## 2021-06-23 NOTE — Assessment & Plan Note (Signed)
Currently stable on Descovy and Pifeltro. Medications are currently managed by infectious disease. Most recent lab results reviewed today from evaluation with Dr. Graylon Good and showed levels remain undetectable. No alarm signs or symptoms are present today. She appears to be tolerating medication well with exception of increased GERD and abdominal weight gain. We will plan to start pantoprazole in the morning for GERD symptoms and continue for 30 days then stop and evaluate if symptoms have improved or if continued dosage as needed. She will follow-up if her symptoms worsen or fail to improve.

## 2021-06-23 NOTE — Assessment & Plan Note (Signed)
Historical diagnosis with current replacement therapy of vitamin D3 2000 IU daily. It has been quite sometime since his level has been checked therefore we will check it again today. If changes need to be made based on lab results we will make recommendations for the change of plan of care.

## 2021-06-23 NOTE — Assessment & Plan Note (Signed)
Blood pressures are stable at this time.  122/82 today.  No alarm symptoms present. Most recent labs performed by Dr. Graylon Good were reviewed today. We will plan to continue current dose of lisinopril. We will plan to follow-up next fall for CPE or sooner if needed.

## 2021-06-23 NOTE — Assessment & Plan Note (Signed)
Symptoms of presentation consistent with acid reflux most likely related to antiviral medications. Discussed the option of starting PPI with a trial for 30 days to see if symptoms improve.  She is agreeable to this. Recommend taking pantoprazole first thing in the morning at the opposite time of day of her antiviral medications to ensure adequate absorption of both medications. At the completion of 30 days evaluate for symptom improvement and may stop to see if symptoms return.  If symptoms do return patient may continue medication or contact the office for further recommendations.

## 2021-06-23 NOTE — Assessment & Plan Note (Signed)
Increased weight gain particularly in the abdomen with use of antiviral medications. BMI is within normal limits today with no concerns for obesity present. Unfortunately this is a side effect of the medications however at this time she has an undetectable status therefore we will continue the current regimen of medication. Recommend daily physical activity with suggestion for 20-minute purposeful walk daily. Will monitor thyroid function today to ensure that there is no concerning finding present that could be adding to this problem. Will follow.

## 2021-06-23 NOTE — Assessment & Plan Note (Signed)
New patient to establish care today. Review of previous medical records and process.  She has not been followed by primary care for quite some time however has had extensive care with infectious disease. She is up-to-date on her mammogram, colonoscopy, and recommended vaccines at this time. We will plan for complete CPE in the fall of next year with labs.  Will get some labs today that have not been consistently drawn with infectious disease for further evaluation.

## 2021-06-23 NOTE — Patient Instructions (Addendum)
Thank you for choosing [facility] for your Primary Care needs. I am excited for the opportunity to partner with you to meet your health care goals. It was a pleasure meeting you today!  Recommendations from today's visit: I have sent refills for your medications today.  If you need refills on your HIV medications feel free to contact me or Dr. Snyder for these.  I sent in pantoprazole for you to take in the mornings to help prevent GERD We will check your cholesterol, thyroid, and VItamin D levels today to make sure these are looking OK as they have not been checked in quite some time.  I have included sciatica exercises for you- I recommend a TENS unit to help with symptoms. These can be purchased on amazon or from our pharmacy downstairs inexpensively.  We can plan for a full physical exam next fall.   Information on diet, exercise, and health maintenance recommendations are listed below. This is information to help you be sure you are on track for optimal health and monitoring.   Please look over this and let us know if you have any questions or if you have completed any of the health maintenance outside of [facility] so that we can be sure your records are up to date.  ___________________________________________________________ About Me: I am an Adult-Geriatric Nurse Practitioner with a background in caring for patients for more than 20 years with a strong intensive care background. I provide primary care and sports medicine services to patients age 13 and older within this office. My education had a strong focus on caring for the older adult population, which I am passionate about. I am also the director of the APP [employer] with [employer].   My desire is to provide you with the best service through preventive medicine and supportive care. I consider you a part of the medical team and value your input. I work diligently to ensure that you are heard and  your needs are met in a safe and effective manner. I want you to feel comfortable with me as your provider and want you to know that your health concerns are important to me.  For your information, our office hours are: Monday, Tuesday, and Thursday 8:00 AM - 5:00 PM Wednesday and Friday 8:00 AM - 12:00 PM.   In my time away from the office I am teaching new APP's within the system and am unavailable, but my partner, Dr. deCuba is in the office for emergent needs.   If you have questions or concerns, please call our office at [phone] or send us a MyChart message and we will respond as quickly as possible.  ____________________________________________________________ MyChart:  For all urgent or time sensitive needs we ask that you please call the office to avoid delays. Our number is [phone]. MyChart is not constantly monitored and due to the large volume of messages a day, replies may take up to 72 business hours.  MyChart Policy: MyChart allows for you to see your visit notes, after visit summary, provider recommendations, lab and tests results, make an appointment, request refills, and contact your provider or the office for non-urgent questions or concerns. Providers are seeing patients during normal business hours and do not have built in time to review MyChart messages.  We ask that you allow a minimum of 3 business days for responses to MyChart messages. For this reason, please do not send urgent requests through MyChart. Please call the office   at [phone]. New and ongoing conditions may require a visit. We have virtual and in person visit available for your convenience.  Complex MyChart concerns may require a visit. Your provider may request you schedule a virtual or in person visit to ensure we are providing the best care possible. MyChart messages sent after 11:00 AM on Friday will not be received by the provider until Monday morning.    Lab and Test Results: You will  receive your lab and test results on MyChart as soon as they are completed and results have been sent by the lab or testing facility. Due to this service, you will receive your results BEFORE your provider.  I review lab and tests results each morning prior to seeing patients. Some results require collaboration with other providers to ensure you are receiving the most appropriate care. For this reason, we ask that you please allow a minimum of 3-5 business days from the time the ALL results have been received for your provider to receive and review lab and test results and contact you about these.  Most lab and test result comments from the provider will be sent through MyChart. Your provider may recommend changes to the plan of care, follow-up visits, repeat testing, ask questions, or request an office visit to discuss these results. You may reply directly to this message or call the office at [phone] to provide information for the provider or set up an appointment. In some instances, you will be called with test results and recommendations. Please let us know if this is preferred and we will make note of this in your chart to provide this for you.    If you have not heard a response to your lab or test results in 5 business days from all results returning to MyChart, please call the office to let us know. We ask that you please avoid calling prior to this time unless there is an emergent concern. Due to high call volumes, this can delay the resulting process.  After Hours: For all non-emergency after hours needs, please call the office at [phone] and select the option to reach the on-call provider service. On-call services are shared between multiple [facility] offices and therefore it will not be possible to speak directly with your provider. On-call providers may provide medical advice and recommendations, but are unable to provide refills for maintenance medications.  For all emergency or  urgent medical needs after normal business hours, we recommend that you seek care at the closest Urgent Care or Emergency Department to ensure appropriate treatment in a timely manner.  [facility] has a 24 hour emergency room located on the ground floor for your convenience.   Urgent Concerns During the Business Day Providers are seeing patients from 8AM to 5PM with a busy schedule and are most often not able to respond to non-urgent calls until the end of the day or the next business day. If you should have URGENT concerns during the day, please call and speak to the nurse or schedule a same day appointment so that we can address your concern without delay.   Thank you, again, for choosing me as your health care partner. I appreciate your trust and look forward to learning more about you.   SaraBeth Early, DNP, AGNP-c ___________________________________________________________  Health Maintenance Recommendations Screening Testing Mammogram Every 1 -2 years based on history and risk factors Starting at age 40 Pap Smear Ages 21-39 every 3 years Ages 30-65 every 5   years with HPV testing More frequent testing may be required based on results and history Colon Cancer Screening Every 1-10 years based on test performed, risk factors, and history Starting at age 45 Bone Density Screening Every 2-10 years based on history Starting at age 65 for women Recommendations for men differ based on medication usage, history, and risk factors AAA Screening One time ultrasound Men 65-75 years old who have every smoked Lung Cancer Screening Low Dose Lung CT every 12 months Age 55-80 years with a 30 pack-year smoking history who still smoke or who have quit within the last 15 years  Screening Labs Routine  Labs: Complete Blood Count (CBC), Complete Metabolic Panel (CMP), Cholesterol (Lipid Panel) Every 6-12 months based on history and medications May be recommended more  frequently based on current conditions or previous results Hemoglobin A1c Lab Every 3-12 months based on history and previous results Starting at age 45 or earlier with diagnosis of diabetes, high cholesterol, BMI >26, and/or risk factors Frequent monitoring for patients with diabetes to ensure blood sugar control Thyroid Panel (TSH w/ T3 & T4) Every 6 months based on history, symptoms, and risk factors May be repeated more often if on medication HIV One time testing for all patients 13 and older May be repeated more frequently for patients with increased risk factors or exposure Hepatitis C One time testing for all patients 18 and older May be repeated more frequently for patients with increased risk factors or exposure Gonorrhea, Chlamydia Every 12 months for all sexually active persons 13-24 years Additional monitoring may be recommended for those who are considered high risk or who have symptoms PSA Men 40-54 years old with risk factors Additional screening may be recommended from age 55-69 based on risk factors, symptoms, and history  Vaccine Recommendations Tetanus Booster All adults every 10 years Flu Vaccine All patients 6 months and older every year COVID Vaccine All patients 12 years and older Initial dosing with booster May recommend additional booster based on age and health history HPV Vaccine 2 doses all patients age 9-26 Dosing may be considered for patients over 26 Shingles Vaccine (Shingrix) 2 doses all adults 55 years and older Pneumonia (Pneumovax 23) All adults 65 years and older May recommend earlier dosing based on health history Pneumonia (Prevnar 13) All adults 65 years and older Dosed 1 year after Pneumovax 23  Additional Screening, Testing, and Vaccinations may be recommended on an individualized basis based on family history, health history, risk factors, and/or exposure.  __________________________________________________________  Diet  Recommendations for All Patients  I recommend that all patients maintain a diet low in saturated fats, carbohydrates, and cholesterol. While this can be challenging at first, it is not impossible and small changes can make big differences.  Things to try: Decreasing the amount of soda, sweet tea, and/or juice to one or less per day and replace with water While water is always the first choice, if you do not like water you may consider adding a water additive without sugar to improve the taste other sugar free drinks Replace potatoes with a brightly colored vegetable at dinner Use healthy oils, such as canola oil or olive oil, instead of butter or hard margarine Limit your bread intake to two pieces or less a day Replace regular pasta with low carb pasta options Bake, broil, or grill foods instead of frying Monitor portion sizes  Eat smaller, more frequent meals throughout the day instead of large meals  An important thing to remember is,   if you love foods that are not great for your health, you don't have to give them up completely. Instead, allow these foods to be a reward when you have done well. Allowing yourself to still have special treats every once in a while is a nice way to tell yourself thank you for working hard to keep yourself healthy.   Also remember that every day is a new day. If you have a bad day and "fall off the wagon", you can still climb right back up and keep moving along on your journey!  We have resources available to help you!  Some websites that may be helpful include: www.MyPlate.gov  Www.VeryWellFit.com _____________________________________________________________  Activity Recommendations for All Patients  I recommend that all adults get at least 20 minutes of moderate physical activity that elevates your heart rate at least 5 days out of the week.  Some examples include: Walking or jogging at a pace that allows you to carry on a conversation Cycling  (stationary bike or outdoors) Water aerobics Yoga Weight lifting Dancing If physical limitations prevent you from putting stress on your joints, exercise in a pool or seated in a chair are excellent options.  Do determine your MAXIMUM heart rate for activity: YOUR AGE - 220 = MAX HeartRate   Remember! Do not push yourself too hard.  Start slowly and build up your pace, speed, weight, time in exercise, etc.  Allow your body to rest between exercise and get good sleep. You will need more water than normal when you are exerting yourself. Do not wait until you are thirsty to drink. Drink with a purpose of getting in at least 8, 8 ounce glasses of water a day plus more depending on how much you exercise and sweat.    If you begin to develop dizziness, chest pain, abdominal pain, jaw pain, shortness of breath, headache, vision changes, lightheadedness, or other concerning symptoms, stop the activity and allow your body to rest. If your symptoms are severe, seek emergency evaluation immediately. If your symptoms are concerning, but not severe, please let us know so that we can recommend further evaluation.     

## 2021-06-23 NOTE — Progress Notes (Signed)
Orma Render, DNP, AGNP-c [facility] Medicine [address]  [place], [place] [address] [phone]  New patient visit   Patient: Cynthia [name]   DOB: [birth year]   55 y.o. Female  MRN: [id number] Visit Date: 06/23/2021  Patient Care Team: Jermia Rigsby, Coralee Pesa, NP as PCP - General (Nurse Practitioner) Carlyle Basques, MD as Consulting Physician (Infectious Diseases)  Today's healthcare provider: Orma Render, NP   Chief Complaint  Patient presents with   New Patient (Initial Visit)    Patient is here to establish care. She would like for all her medications be filled by you. NO RF's needed at this time.    Subjective    Cynthia [name] is a 55 y.o. female who presents today as a new patient to establish care.  HPI L[place]on is currently followed by Dr. Baxter Flattery with ID and has been receiving the majority of her care from that office over the past several years. She is currently on antiviral medications to treat her HIV infection and status is currently undetectable.  She was previously on Biktarvy however this medication was stopped due to increased weight gain.  She meets with ID once a year for follow-up.  She has no symptoms of fatigue, weight loss, rash, sweating, new cough, or shortness of breath.   She is followed by Marny Lowenstein for her gynecologic needs.  Her most recent Pap was in January of this year.  She does endorse increased symptoms of reflux at nighttime after taking her medication for HIV.  She has moved the dosing of this medication to dinnertime to see if this helps with symptoms but she is still experiencing significant reflux.  She has not taking anything for this.  She has a history of hypertension and hyperlipidemia for which she is taking lisinopril and rosuvastatin.  She does not check her blood pressures at home but denies any symptoms of dizziness, headache, blurred vision, chest pain, lower extremity edema.  She endorses  intermittent concerns with sciatic type pain in the left buttock.  She recently had an exacerbation of this but reports it is now getting better.  She has no concerning symptoms present today. She also has concerns over weight gain related to her antiviral medication however this has improved since stopping Biktarvy.  Her last colonoscopy was about 5 years ago.  They did detect the presence of polyps and her next colonoscopy should be repeated at year 7. Her last mammogram was earlier this year.  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[place])    COVID-19    DISEASE, HYPERTENSIVE HEART, BENIGN, W/HF 11/07/2006   Qualifier: Diagnosis of  By: Cline Cools RN, Margaretmary Bayley     GERD (gastroesophageal reflux disease)    HIV (human immunodeficiency virus infection) ([place])    HIV infection ([place])    Hypertension    Past Surgical History:  Procedure Laterality Date   EYE SURGERY     Six surgeries   FINGER SURGERY  05/2010   "SCREWS IN AND OUT OF FINGER"   LASER VAPORIZATION OF OF CONDYLOMA     MANDIBLE SURGERY     NOSE SURGERY     Family Status  Relation Name Status   Father  Alive   Mother  Alive   MGM  Deceased   PGM  (Not Specified)   [place]  Deceased   Neg Hx  (Not Specified)   Family History  Problem Relation Age of Onset   Diabetes Father    Hypertension Father    Hypertension Mother    Cancer Maternal Grandmother        COLON   Heart disease Maternal Grandmother    Colon cancer Maternal Grandmother    Breast cancer Paternal Grandmother    Diabetes Paternal Grandmother    Colon cancer Cousin    Esophageal cancer Neg Hx    Stomach cancer Neg Hx    Rectal cancer Neg Hx    Social History   Socioeconomic History   Marital status: Married    Spouse name: Not on file   Number of children: Not on file   Years of education: Not on file   Highest education level: Not on file  Occupational History   Not on file  Tobacco Use   Smoking status: Former    Smokeless tobacco: Never   Tobacco comments:    Quit 1996  Vaping Use   Vaping Use: Never used  Substance and Sexual Activity   Alcohol use: Yes    Alcohol/week: 0.0 standard drinks    Comment: socially   Drug use: No   Sexual activity: Yes    Partners: Male    Birth control/protection: Condom    Comment: intercourse age 61, sexual partners more than 5 - declined condoms 6.23.2022  Other Topics Concern   Not on file  Social History Narrative   Not on file   Social Determinants of Health   Financial Resource Strain: Not on file  Food Insecurity: Not on file  Transportation Needs: Not on file  Physical Activity: Not on file  Stress: Not on file  Social Connections: Not on file   Outpatient Medications Prior to Visit  Medication Sig   doravirine (PIFELTRO) 100 MG TABS tablet Take 1 tablet (100 mg total) by mouth daily.   emtricitabine-tenofovir AF (DESCOVY) 200-25 MG tablet Take 1 tablet by mouth daily.   [DISCONTINUED] bictegravir-emtricitabine-tenofovir AF (BIKTARVY) 50-200-25 MG TABS tablet Biktarvy 50 mg-200 mg-25 mg tablet   [DISCONTINUED] cetirizine (ZYRTEC) 10 MG tablet Take 10 mg by mouth daily.   [DISCONTINUED] Cholecalciferol (VITAMIN D) 2000 UNITS tablet Take 2,000 Units by mouth daily.   [DISCONTINUED] doxycycline (VIBRAMYCIN) 100 MG capsule Take 1 capsule (100 mg total) by mouth 2 (two) times daily.   [DISCONTINUED] lisinopril (ZESTRIL) 10 MG tablet TAKE 1 TABLET BY MOUTH DAILY   [DISCONTINUED] nitrofurantoin, macrocrystal-monohydrate, (MACROBID) 100 MG capsule Take 1 capsule (100 mg total) by mouth 2 (two) times daily.   [DISCONTINUED] ondansetron (ZOFRAN ODT) 4 MG disintegrating tablet Take 1 tablet (4 mg total) by mouth every 8 (eight) hours as needed for nausea or vomiting.   [DISCONTINUED] rosuvastatin (CRESTOR) 10 MG tablet Take 1 tablet by mouth daily.   [DISCONTINUED] 0.9 %  sodium chloride infusion    No facility-administered medications prior to visit.    Allergies  Allergen Reactions   Dapsone Shortness Of Breath   Penicillins    Amoxicillin Rash    Immunization History  Administered Date(s) Administered   Hepatitis B 08/06/1993, 08/22/2001   Influenza Split 05/21/2011, 05/06/2012   Influenza Whole 05/03/2003, 05/08/2007, 04/20/2010   Influenza,inj,Quad PF,6+ Mos 05/08/2013, 05/06/2014   Influenza-Unspecified 05/10/2016, 05/06/2017, 04/17/2019   Meningococcal Mcv4o 05/16/2017   PFIZER Comirnaty(Gray Top)Covid-19 Tri-Sucrose Vaccine 10/02/2019, 10/23/2019, 05/27/2020   Pneumococcal Polysaccharide-23 08/21/2000, 11/14/2009   Tdap 08/07/2016    Health Maintenance  Topic Date Due   Zoster Vaccines- Shingrix (1 of 2) Never done  Pneumococcal Vaccine 8-20 Years old (3 - PCV) 11/15/2010   PAP SMEAR-Modifier  07/17/2019   COVID-19 Vaccine (4 - Booster for Pfizer series) 07/22/2020   INFLUENZA VACCINE  03/06/2021   COLONOSCOPY (Pts 45-6yrs Insurance coverage will need to be confirmed)  04/03/2023   MAMMOGRAM  04/05/2023   TETANUS/TDAP  08/07/2026   Hepatitis C Screening  Completed   HIV Screening  Completed   HPV VACCINES  Aged Out    Patient Care Team: Rubena Roseman, Coralee Pesa, NP as PCP - General (Nurse Practitioner) Carlyle Basques, MD as Consulting Physician (Infectious Diseases)  Review of Systems All review of systems negative except what is listed in the HPI    Objective    BP 122/82   Pulse 99   Ht 5\' 5"  (1.651 m)   Wt 139 lb 9.6 oz (63.3 kg)   LMP 05/07/2019   SpO2 (!) 82%   BMI 23.23 kg/m  Physical Exam Vitals and nursing note reviewed.  Constitutional:      General: She is not in acute distress.    Appearance: Normal appearance.  Eyes:     Extraocular Movements: Extraocular movements intact.     Conjunctiva/sclera: Conjunctivae normal.     Pupils: Pupils are equal, round, and reactive to light.  Neck:     Vascular: No carotid bruit.  Cardiovascular:     Rate and Rhythm: Normal rate and regular rhythm.      Pulses: Normal pulses.     Heart sounds: Normal heart sounds. No murmur heard. Pulmonary:     Effort: Pulmonary effort is normal.     Breath sounds: Normal breath sounds. No wheezing.  Abdominal:     General: Bowel sounds are normal.     Palpations: Abdomen is soft.  Musculoskeletal:        General: Normal range of motion.     Cervical back: Normal range of motion.     Right lower leg: No edema.     Left lower leg: No edema.  Skin:    General: Skin is warm and dry.     Capillary Refill: Capillary refill takes less than 2 seconds.  Neurological:     General: No focal deficit present.     Mental Status: She is alert and oriented to person, place, and time.  Psychiatric:        Mood and Affect: Mood normal.        Behavior: Behavior normal.        Thought Content: Thought content normal.        Judgment: Judgment normal.     Depression Screen PHQ 2/9 Scores 06/23/2021 01/26/2021 07/27/2020 01/25/2020  PHQ - 2 Score 0 0 0 0  PHQ- 9 Score 2 - - -   No results found for any visits on 06/23/21.  Assessment & Plan      Problem List Items Addressed This Visit     Asymptomatic HIV infection, CD4 >=500 ([place])    Currently stable on Descovy and Pifeltro. Medications are currently managed by infectious disease. Most recent lab results reviewed today from evaluation with Dr. Graylon Good and showed levels remain undetectable. No alarm signs or symptoms are present today. She appears to be tolerating medication well with exception of increased GERD and abdominal weight gain. We will plan to start pantoprazole in the morning for GERD symptoms and continue for 30 days then stop and evaluate if symptoms have improved or if continued dosage as needed. She will follow-up if her symptoms worsen  or fail to improve.      Hypertension    Blood pressures are stable at this time.  122/82 today.  No alarm symptoms present. Most recent labs performed by Dr. Graylon Good were reviewed today. We will plan to  continue current dose of lisinopril. We will plan to follow-up next fall for CPE or sooner if needed.      Relevant Medications   lisinopril (ZESTRIL) 10 MG tablet   rosuvastatin (CRESTOR) 10 MG tablet   Encounter for medical examination to establish care - Primary    New patient to establish care today. Review of previous medical records and process.  She has not been followed by primary care for quite some time however has had extensive care with infectious disease. She is up-to-date on her mammogram, colonoscopy, and recommended vaccines at this time. We will plan for complete CPE in the fall of next year with labs.  Will get some labs today that have not been consistently drawn with infectious disease for further evaluation.      Relevant Orders   Lipid panel   VITAMIN D 25 Hydroxy (Vit-D Deficiency, Fractures)   Thyroid Panel With TSH   Sciatica of left side    History of intermittent sciatica in the left side.  This is not currently bothering her at this time. Patient provided with at home exercises that she can do in the event that this becomes bothersome we can. Also recommend use of TENS unit to help with the pain and decrease the length of time that symptoms are present. She will follow-up if her symptoms worsen or fail to improve.      Gastroesophageal reflux disease    Symptoms of presentation consistent with acid reflux most likely related to antiviral medications. Discussed the option of starting PPI with a trial for 30 days to see if symptoms improve.  She is agreeable to this. Recommend taking pantoprazole first thing in the morning at the opposite time of day of her antiviral medications to ensure adequate absorption of both medications. At the completion of 30 days evaluate for symptom improvement and may stop to see if symptoms return.  If symptoms do return patient may continue medication or contact the office for further recommendations.       Relevant  Medications   pantoprazole (PROTONIX) 40 MG tablet   Weight gain    Increased weight gain particularly in the abdomen with use of antiviral medications. BMI is within normal limits today with no concerns for obesity present. Unfortunately this is a side effect of the medications however at this time she has an undetectable status therefore we will continue the current regimen of medication. Recommend daily physical activity with suggestion for 20-minute purposeful walk daily. Will monitor thyroid function today to ensure that there is no concerning finding present that could be adding to this problem. Will follow.      Relevant Orders   Lipid panel   Thyroid Panel With TSH   Hyperlipidemia    Currently taking rosuvastatin 10 mg daily and tolerating well. Is been quite sometime since she had a lipid panel performed therefore we will obtain this today. Refills provided for medication today. Follow-up in 1 year.      Relevant Medications   lisinopril (ZESTRIL) 10 MG tablet   rosuvastatin (CRESTOR) 10 MG tablet   Other Relevant Orders   Lipid panel   Vitamin D deficiency    Historical diagnosis with current replacement therapy of vitamin D3 2000 IU  daily. It has been quite sometime since his level has been checked therefore we will check it again today. If changes need to be made based on lab results we will make recommendations for the change of plan of care.      Relevant Medications   Cholecalciferol (VITAMIN D) 50 MCG (2000 UT) tablet   Other Relevant Orders   VITAMIN D 25 Hydroxy (Vit-D Deficiency, Fractures)   Other Visit Diagnoses     Essential hypertension       Relevant Medications   lisinopril (ZESTRIL) 10 MG tablet   rosuvastatin (CRESTOR) 10 MG tablet   Other Relevant Orders   Lipid panel        Return for Next fall for CPE.      Tavaughn Silguero, Coralee Pesa, NP, DNP, AGNP-C [facility] at [facility]

## 2021-06-23 NOTE — Assessment & Plan Note (Signed)
History of intermittent sciatica in the left side.  This is not currently bothering her at this time. Patient provided with at home exercises that she can do in the event that this becomes bothersome we can. Also recommend use of TENS unit to help with the pain and decrease the length of time that symptoms are present. She will follow-up if her symptoms worsen or fail to improve.

## 2021-06-24 LAB — LIPID PANEL
Chol/HDL Ratio: 3.1 ratio (ref 0.0–4.4)
Cholesterol, Total: 156 mg/dL (ref 100–199)
HDL: 51 mg/dL (ref >39)
LDL Chol Calc (NIH): 88 mg/dL (ref 0–99)
Triglycerides: 92 mg/dL (ref 0–149)
VLDL Cholesterol Cal: 17 mg/dL (ref 5–40)

## 2021-06-24 LAB — VITAMIN D 25 HYDROXY (VIT D DEFICIENCY, FRACTURES): Vit D, 25-Hydroxy: 33 ng/mL (ref 30.0–100.0)

## 2021-07-05 ENCOUNTER — Other Ambulatory Visit (HOSPITAL_COMMUNITY): Payer: Self-pay

## 2021-07-10 ENCOUNTER — Other Ambulatory Visit (HOSPITAL_COMMUNITY): Payer: Self-pay

## 2021-07-11 ENCOUNTER — Other Ambulatory Visit (HOSPITAL_COMMUNITY): Payer: Self-pay

## 2021-07-14 ENCOUNTER — Other Ambulatory Visit (HOSPITAL_COMMUNITY): Payer: Self-pay

## 2021-07-17 ENCOUNTER — Other Ambulatory Visit (HOSPITAL_COMMUNITY): Payer: Self-pay

## 2021-08-01 ENCOUNTER — Other Ambulatory Visit (HOSPITAL_COMMUNITY): Payer: Self-pay

## 2021-08-31 ENCOUNTER — Other Ambulatory Visit (HOSPITAL_COMMUNITY): Payer: Self-pay

## 2021-09-01 ENCOUNTER — Other Ambulatory Visit (HOSPITAL_COMMUNITY): Payer: Self-pay

## 2021-09-04 ENCOUNTER — Encounter: Payer: Self-pay | Admitting: Internal Medicine

## 2021-09-04 ENCOUNTER — Other Ambulatory Visit (HOSPITAL_COMMUNITY): Payer: Self-pay

## 2021-09-06 ENCOUNTER — Other Ambulatory Visit (HOSPITAL_COMMUNITY): Payer: Self-pay

## 2021-09-26 ENCOUNTER — Other Ambulatory Visit (HOSPITAL_COMMUNITY): Payer: Self-pay

## 2021-10-03 ENCOUNTER — Other Ambulatory Visit (HOSPITAL_COMMUNITY): Payer: Self-pay

## 2021-10-04 ENCOUNTER — Telehealth: Payer: Self-pay

## 2021-10-04 ENCOUNTER — Ambulatory Visit (INDEPENDENT_AMBULATORY_CARE_PROVIDER_SITE_OTHER): Payer: 59 | Admitting: Nurse Practitioner

## 2021-10-04 ENCOUNTER — Encounter: Payer: Self-pay | Admitting: Nurse Practitioner

## 2021-10-04 ENCOUNTER — Other Ambulatory Visit: Payer: Self-pay

## 2021-10-04 VITALS — BP 112/76 | Ht 65.0 in | Wt 137.0 lb

## 2021-10-04 DIAGNOSIS — R635 Abnormal weight gain: Secondary | ICD-10-CM

## 2021-10-04 DIAGNOSIS — Z01419 Encounter for gynecological examination (general) (routine) without abnormal findings: Secondary | ICD-10-CM

## 2021-10-04 DIAGNOSIS — Z780 Asymptomatic menopausal state: Secondary | ICD-10-CM

## 2021-10-04 DIAGNOSIS — R150 Incomplete defecation: Secondary | ICD-10-CM

## 2021-10-04 NOTE — Progress Notes (Signed)
? ?Cynthia [name] [birth year] [id number] ? ? ?History:  56 y.o. [id number] presents for annual exam. She complains of intermittent difficulty emptying bowels, sometimes having to splint. Says bowels move easy in the mornings but with more difficulty in the afternoons. Denies constipation, pain, or bleeding. Postmenopausal - no HRT. 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 Normal mammogram history. HIV infection (2000) managed by ID, on antivirals, currently undetected. She complains of difficulty losing weight. ID switched antiviral regimen to help with this. HTN, HLD managed by PCP.  ? ?Gynecologic History ?No LMP recorded. Patient is postmenopausal. ?  ?Contraception/Family planning: post menopausal status ?Sexually active: Yes ? ?Health Maintenance ?Last Pap: 08/29/2020. Results were: Normal, 3-year repeat ?Last mammogram: 04/04/2021. Results were: Normal ?Last colonoscopy: 04/02/2018. Results were: Polyp. 5-year recall ?Last Dexa: N/A ? ?Past medical history, past surgical history, family history and social history were all reviewed and documented in the EPIC chart. Married. 52 yo daughter, married with 3 children. Moving back to this area soon. Surveyor, quantity for [employer].  ? ?ROS:  A ROS was performed and pertinent positives and negatives are included. ? ?Exam: ? ?Vitals:  ? 10/04/21 1456  ?BP: 112/76  ?Weight: 137 lb (62.1 kg)  ?Height: 5\' 5"  (1.651 m)  ? ? ?Body mass index is 22.8 kg/m?. ? ?General appearance:  Normal ?Thyroid:  Symmetrical, normal in size, without palpable masses or nodularity. ?Respiratory ? Auscultation:  Clear without wheezing or rhonchi ?Cardiovascular ? Auscultation:  Regular rate, without rubs, murmurs or gallops ? Edema/varicosities:  Not grossly evident ?Abdominal ? Soft,nontender, without masses, guarding or rebound. ? Liver/spleen:  No organomegaly noted ? Hernia:  None appreciated ? Skin ? Inspection:  Grossly normal ?  ?Breasts: Examined lying and sitting.  ? Right: Without masses,  retractions, discharge or axillary adenopathy. ? ? Left: Without masses, retractions, discharge or axillary adenopathy. ?Genitourinary  ? Inguinal/mons:  Normal without inguinal adenopathy ? External genitalia:  Normal appearing vulva with no masses, tenderness, or lesions ? BUS/Urethra/Skene's glands:  Normal ? Vagina:  Normal appearing with normal color and discharge, no lesions No evidence of rectocele ? Cervix:  Normal appearing without discharge or lesions ? Uterus:  Normal in size, shape and contour.  Midline and mobile, nontender ? Adnexa/parametria:   ?  Rt: Normal in size, without masses or tenderness. ?  Lt: Normal in size, without masses or tenderness. ? Anus and perineum: Normal ? Digital rectal exam: Normal sphincter tone without palpated masses or tenderness ? ?Patient informed chaperone available to be present for breast and pelvic exam. Patient has requested no chaperone to be present. Patient has been advised what will be completed during breast and pelvic exam.  ? ?Assessment/Plan:  56 y.o. [id number] for annual exam.  ? ?Well female exam with routine gynecological exam - Education provided on SBEs, importance of preventative screenings, current guidelines, high calcium diet, regular exercise, and multivitamin daily. Labs with PCP.  ? ?Postmenopausal - no HRT, no bleeding ? ?Weight gain - Difficulty losing weight. Normal BMI. Feels weight is central. Could be from antiviral medications and/or postmenopausal changes related to visceral fat. She does walk some. Recommend 150 minutes of vigorous walking/exercise each week making sure to incorporate weight-training. Also discussed intermittent fasting and avoiding late night eating. ? ?Incomplete defecation - She complains of intermittent difficulty emptying bowels, sometimes having to splint. Says bowels move easy in the mornings but with more difficulty in the afternoons. Denies constipation, pain, or bleeding. No evidence of rectocele. Would  like  referral to pelvic floor PT.  ? ?Screening for cervical cancer - 1995 CIN-1 LEEP, subsequent paps normal. Will repeat pap at 3-year interval due to HIV status.  ? ?Screening for breast cancer - Normal mammogram history.  Continue annual screenings.  Normal breast exam today. ? ?Screening for colon cancer - 2019 colonoscopy. Will repeat at GI's recommended interval.  ? ?Follow up in 1 year for annual.  ? ? ? ? ?Tamela Gammon [facility], 3:28 PM 10/04/2021 ? ?

## 2021-10-04 NOTE — Telephone Encounter (Signed)
Pelvic floor PT referral ?Received: Today ?Tamela Gammon, NP  P [facility] Triage ? ?Please send referral for pelvic floor PT for incomplete defecation.  ?

## 2021-10-20 ENCOUNTER — Other Ambulatory Visit (HOSPITAL_COMMUNITY): Payer: Self-pay

## 2021-10-23 ENCOUNTER — Other Ambulatory Visit (HOSPITAL_COMMUNITY): Payer: Self-pay

## 2021-10-24 ENCOUNTER — Other Ambulatory Visit (HOSPITAL_COMMUNITY): Payer: Self-pay

## 2021-11-04 ENCOUNTER — Telehealth: Payer: 59 | Admitting: Nurse Practitioner

## 2021-11-04 DIAGNOSIS — N3000 Acute cystitis without hematuria: Secondary | ICD-10-CM

## 2021-11-04 MED ORDER — NITROFURANTOIN MONOHYD MACRO 100 MG PO CAPS: 100.0000 mg | ORAL_CAPSULE | Freq: Two times a day (BID) | ORAL | 0 refills | Status: AC

## 2021-11-04 NOTE — Progress Notes (Signed)
I have spent 5 minutes in review of e-visit questionnaire, review and updating patient chart, medical decision making and response to patient.  ° °Lakenya Riendeau W Anaka Beazer, NP ° °  °

## 2021-11-04 NOTE — Progress Notes (Signed)

## 2021-11-06 ENCOUNTER — Ambulatory Visit: Payer: 59 | Admitting: Physical Therapy

## 2021-11-07 ENCOUNTER — Telehealth: Payer: 59 | Admitting: Nurse Practitioner

## 2021-11-07 ENCOUNTER — Encounter: Payer: Self-pay | Admitting: Emergency Medicine

## 2021-11-07 ENCOUNTER — Encounter (HOSPITAL_BASED_OUTPATIENT_CLINIC_OR_DEPARTMENT_OTHER): Payer: Self-pay | Admitting: Nurse Practitioner

## 2021-11-07 ENCOUNTER — Ambulatory Visit
Admission: EM | Admit: 2021-11-07 | Discharge: 2021-11-07 | Disposition: A | Discharge status: Home / Self Care | Payer: 59 | Attending: Emergency Medicine | Admitting: Emergency Medicine

## 2021-11-07 DIAGNOSIS — R112 Nausea with vomiting, unspecified: Secondary | ICD-10-CM

## 2021-11-07 DIAGNOSIS — R21 Rash and other nonspecific skin eruption: Secondary | ICD-10-CM

## 2021-11-07 DIAGNOSIS — R300 Dysuria: Secondary | ICD-10-CM

## 2021-11-07 LAB — POCT URINALYSIS DIP (MANUAL ENTRY)
Glucose, UA: NEGATIVE mg/dL
Nitrite, UA: NEGATIVE
Protein Ur, POC: 100 mg/dL — AB
Spec Grav, UA: 1.015 (ref 1.010–1.025)
Urobilinogen, UA: 0.2 E.U./dL
pH, UA: 5.5 (ref 5.0–8.0)

## 2021-11-07 MED ORDER — ONDANSETRON 4 MG PO TBDP
4.0000 mg | ORAL_TABLET | Freq: Once | ORAL | Status: AC
  Administered 2021-11-07: 4 mg via ORAL

## 2021-11-07 MED ORDER — ONDANSETRON 4 MG PO TBDP: 4.0000 mg | ORAL_TABLET | Freq: Three times a day (TID) | ORAL | 0 refills | Status: DC | PRN

## 2021-11-07 NOTE — ED Triage Notes (Signed)
Pt here with fever, N/V x 2 days with rash on feet and legs. Macrobid started on Saturday for UTI.  ?

## 2021-11-07 NOTE — Discharge Instructions (Addendum)
Stop taking the Macrobid.  A urine culture is pending.  We will call you if it shows the need for treatment. ? ?Take the antinausea medication as directed.  Keep yourself hydrated with clear liquids, such as water and Gatorade.   ? ?Take Benadryl for the rash.  ? ?Go to the emergency department if you have acute worsening symptoms.  Follow up with your primary care provider.   ? ? ? ?

## 2021-11-07 NOTE — Progress Notes (Signed)
Cynthia [name], ? ?Your nausea and vomiting may be part of a reaction to the medication, it may also be a symptom associated with an untreated UTI. Due to multiple things going on today and with the recent UTI I would feel best if you are seen in person today. If your primary care cannot see you I am attaching a list of urgent care facilities. Sorry for the inconvenience but we want to assure you are getting the best care.  ? ?Based on what you shared with me, I feel your condition warrants further evaluation and I recommend that you be seen in a face to face visit. ?  ?NOTE: There will be NO CHARGE for this eVisit ?  ?If you are having a true medical emergency please call 911.   ?  ? For an urgent face to face visit, [facility] has six urgent care centers for your convenience:  ?  ? [facility] [place] [facility] ?Get Driving Directions ?[phone] ?[address] ?[place], [place] [address] ?  ? [facility]) ?Get Driving Directions ?[phone] ?[address] ?[place], [place] [address] ? ?[facility] [place] ([place]) ?Get Driving Directions ?[address] [place],  [place]  [address] ? ?[facility] ?Get Driving Directions ?[phone] ?[address], [address] ?[place], [place] [address] ?  ?[facility] [place] ?Get Driving Directions  ?[phone] ?[address] ?[address] ?[place], [place] [address] ?  ?[facility] ?Get Driving Directions ?[phone] ?[address] ?[place], [place] [address] ? ?Your MyChart E-visit questionnaire answers were reviewed by a board certified advanced clinical practitioner to complete your personal care plan based on your specific symptoms.  Thank you for using e-Visits. ?

## 2021-11-07 NOTE — ED Provider Notes (Signed)
?[facility] ? ? ? ?CSN: [id number] ?Arrival date & time: 11/07/21  6734 ? ? ?  ? ?History   ?Chief Complaint ?Chief Complaint  ?Patient presents with  ? Fever  ?  Entered by patient  ? Emesis  ? Nausea  ? Rash  ? ? ?HPI ?Cynthia [name] is a 56 y.o. female.  Patient presents with 3-day history of nausea, vomiting, fever, rash.  1 episode of vomiting this morning.  Tmax 102 on 11/04/2021; no fever in the last 24 hours.  No OTC medications taken today.  Patient denies sore throat, cough, chest pain, shortness of breath, abdominal pain, dysuria, hematuria, diarrhea, constipation, or other symptoms.  Last bowel movement today.  Patient had an E-visit on 11/04/2021; was diagnosed with acute cystitis; treated with Macrobid.  She had an E-visit today; diagnosed with rash; she was instructed to be seen in person.  Patient stopped taking the Macrobid yesterday due to the possibility that her symptoms were an allergic reaction.  Her medical history includes hypertension, HIV, seasonal allergies, GERD. ? ?The history is provided by the patient and medical records.  ? ?Past Medical History:  ?Diagnosis Date  ? Allergy   ? 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 COVID-19   ? DISEASE, HYPERTENSIVE HEART, BENIGN, W/HF 11/07/2006  ? Qualifier: Diagnosis of  By: Nat Math    ? GERD (gastroesophageal reflux disease)   ? HIV (human immunodeficiency virus infection) ([place])   ? HIV infection ([place])   ? Hypertension   ? ? ?Patient Active Problem List  ? Diagnosis Date Noted  ? Encounter for medical examination to establish care 06/23/2021  ? Sciatica of left side 06/23/2021  ? Gastroesophageal reflux disease 06/23/2021  ? Weight gain 06/23/2021  ? Hyperlipidemia 06/23/2021  ? Vitamin D deficiency 06/23/2021  ? Avulsion of ligament with bony fragment of lateral malleolus 02/15/2021  ? Gastroenteritis 11/17/2020  ? Status post intraocular lens implant 09/10/2013  ? 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 Hypertension   ? Presence of intraocular lens 08/23/2011  ?  Retinal detachment of right eye with multiple breaks 08/23/2011  ? Asymptomatic HIV infection, CD4 >=500 ([place]) 11/07/2006  ? ? ?Past Surgical History:  ?Procedure Laterality Date  ? EYE SURGERY    ? Six surgeries  ? FINGER SURGERY  05/2010  ? "SCREWS IN AND OUT OF FINGER"  ? LASER VAPORIZATION OF OF CONDYLOMA    ? MANDIBLE SURGERY    ? NOSE SURGERY    ? ? ?OB History   ? ? Gravida  ?4  ? Para  ?1  ? Term  ?   ? Preterm  ?   ? AB  ?3  ? Living  ?1  ?  ? ? SAB  ?3  ? IAB  ?   ? Ectopic  ?   ? Multiple  ?   ? Live Births  ?   ?   ?  ?  ? ? ? ?Home Medications   ? ?Prior to Admission medications   ?Medication Sig Start Date End Date Taking? Authorizing Provider  ?ondansetron (ZOFRAN-ODT) 4 MG disintegrating tablet Take 1 tablet (4 mg total) by mouth every 8 (eight) hours as needed for nausea or vomiting. 11/07/21  Yes Sharion Balloon, NP  ?Cholecalciferol (VITAMIN D) 50 MCG (2000 UT) tablet Take 1 tablet (2,000 Units total) by mouth daily. 06/23/21   Orma Render, NP  ?doravirine (PIFELTRO) 100 MG TABS tablet Take 1 tablet (100 mg  total) by mouth daily. 01/26/21   Carlyle Basques, MD  ?emtricitabine-tenofovir AF (DESCOVY) 200-25 MG tablet Take 1 tablet by mouth daily. 01/26/21   Carlyle Basques, MD  ?lisinopril (ZESTRIL) 10 MG tablet TAKE 1 TABLET BY MOUTH DAILY 06/23/21 06/23/22  Early, Coralee Pesa, NP  ?nitrofurantoin, macrocrystal-monohydrate, (MACROBID) 100 MG capsule Take 1 capsule (100 mg total) by mouth 2 (two) times daily for 5 days. 11/04/21 11/09/21  Gildardo Pounds, NP  ?rosuvastatin (CRESTOR) 10 MG tablet Take 1 tablet by mouth daily. 06/23/21 06/23/22  Orma Render, NP  ? ? ?Family History ?Family History  ?Problem Relation Age of Onset  ? Diabetes Father   ? Hypertension Father   ? Hypertension Mother   ? Cancer Maternal Grandmother   ?     COLON  ? Heart disease Maternal Grandmother   ? Colon cancer Maternal Grandmother   ? Breast cancer Paternal Grandmother   ? Diabetes Paternal Grandmother   ? Colon cancer Cousin    ? Esophageal cancer Neg Hx   ? Stomach cancer Neg Hx   ? Rectal cancer Neg Hx   ? ? ?Social History ?Social History  ? ?Tobacco Use  ? Smoking status: Former  ? Smokeless tobacco: Never  ? Tobacco comments:  ?  Quit 1996  ?Vaping Use  ? Vaping Use: Never used  ?Substance Use Topics  ? Alcohol use: Yes  ?  Alcohol/week: 0.0 standard drinks  ?  Comment: socially  ? Drug use: No  ? ? ? ?Allergies   ?Dapsone and Amoxicillin ? ? ?Review of Systems ?Review of Systems  ?Constitutional:  Positive for fever. Negative for chills.  ?HENT:  Negative for ear pain and sore throat.   ?Respiratory:  Negative for cough and shortness of breath.   ?Cardiovascular:  Negative for chest pain and palpitations.  ?Gastrointestinal:  Positive for nausea and vomiting. Negative for abdominal pain, blood in stool, constipation and diarrhea.  ?Genitourinary:  Negative for dysuria, flank pain, frequency, hematuria, pelvic pain and vaginal discharge.  ?Skin:  Positive for rash. Negative for color change.  ?All other systems reviewed and are negative. ? ? ?Physical Exam ?Triage Vital Signs ?ED Triage Vitals  ?Enc Vitals Group  ?   BP 11/07/21 0936 104/71  ?   Pulse Rate 11/07/21 0936 68  ?   Resp 11/07/21 0936 20  ?   Temp 11/07/21 0936 98.3 ?F (36.8 ?C)  ?   Temp src --   ?   SpO2 11/07/21 0936 98 %  ?   Weight --   ?   Height --   ?   Head Circumference --   ?   Peak Flow --   ?   Pain Score 11/07/21 0931 0  ?   Pain Loc --   ?   Pain Edu? --   ?   Excl. in [place]? --   ? ?No data found. ? ?Updated Vital Signs ?BP 104/71   Pulse 68   Temp 98.3 ?F (36.8 ?C)   Resp 20   LMP 05/07/2019   SpO2 98%  ? ?Visual Acuity ?Right Eye Distance:   ?Left Eye Distance:   ?Bilateral Distance:   ? ?Right Eye Near:   ?Left Eye Near:    ?Bilateral Near:    ? ?Physical Exam ?Vitals and nursing note reviewed.  ?Constitutional:   ?   General: She is not in acute distress. ?   Appearance: Normal appearance. She is well-developed. She is not  ill-appearing.  ?HENT:  ?    Right Ear: Tympanic membrane normal.  ?   Left Ear: Tympanic membrane normal.  ?   Nose: Nose normal.  ?   Mouth/Throat:  ?   Mouth: Mucous membranes are moist.  ?   Pharynx: Oropharynx is clear.  ?Eyes:  ?   Conjunctiva/sclera: Conjunctivae normal.  ?Cardiovascular:  ?   Rate and Rhythm: Normal rate and regular rhythm.  ?   Heart sounds: Normal heart sounds.  ?Pulmonary:  ?   Effort: Pulmonary effort is normal. No respiratory distress.  ?   Breath sounds: Normal breath sounds.  ?Abdominal:  ?   General: Bowel sounds are normal.  ?   Palpations: Abdomen is soft.  ?   Tenderness: There is no abdominal tenderness. There is no right CVA tenderness, left CVA tenderness, guarding or rebound.  ?Musculoskeletal:  ?   Cervical back: Neck supple.  ?Skin: ?   General: Skin is warm and dry.  ?   Findings: Rash present.  ?   Comments: Fine red macular rash on bilateral lower legs and feet.  ?Neurological:  ?   Mental Status: She is alert.  ?Psychiatric:     ?   Mood and Affect: Mood normal.     ?   Behavior: Behavior normal.  ? ? ? ?UC Treatments / Results  ?Labs ?(all labs ordered are listed, but only abnormal results are displayed) ?Labs Reviewed  ?POCT URINALYSIS DIP (MANUAL ENTRY) - Abnormal; Notable for the following components:  ?    Result Value  ? Bilirubin, UA small (*)   ? Ketones, POC UA moderate (40) (*)   ? Blood, UA moderate (*)   ? Protein Ur, POC =100 (*)   ? Leukocytes, UA Trace (*)   ? All other components within normal limits  ?URINE CULTURE  ? ? ?EKG ? ? ?Radiology ?No results found. ? ?Procedures ?Procedures (including critical care time) ? ?Medications Ordered in UC ?Medications  ?ondansetron (ZOFRAN-ODT) disintegrating tablet 4 mg (4 mg Oral Given 11/07/21 0945)  ? ? ?Initial Impression / Assessment and Plan / UC Course  ?I have reviewed the triage vital signs and the nursing notes. ? ?Pertinent labs & imaging results that were available during my care of the patient were reviewed by me and considered in my  medical decision making (see chart for details). ? ?Dysuria, nausea and vomiting, rash.  Patient stopped taking the [place] yesterday.  Urine culture pending; discussed with patient that we will call h

## 2021-11-09 ENCOUNTER — Other Ambulatory Visit (HOSPITAL_COMMUNITY): Payer: Self-pay

## 2021-11-09 ENCOUNTER — Telehealth (HOSPITAL_COMMUNITY): Payer: Self-pay | Admitting: Emergency Medicine

## 2021-11-09 LAB — URINE CULTURE: Culture: 60000 — AB

## 2021-11-09 MED ORDER — CEPHALEXIN 500 MG PO CAPS: 500.0000 mg | ORAL_CAPSULE | Freq: Two times a day (BID) | ORAL | 0 refills | Status: AC

## 2021-11-10 ENCOUNTER — Other Ambulatory Visit (HOSPITAL_COMMUNITY): Payer: Self-pay

## 2021-11-15 ENCOUNTER — Ambulatory Visit: Payer: 59 | Admitting: Physical Therapy

## 2021-11-16 ENCOUNTER — Encounter: Payer: Self-pay | Admitting: Physical Therapy

## 2021-11-16 ENCOUNTER — Ambulatory Visit: Payer: 59 | Attending: Nurse Practitioner | Admitting: Physical Therapy

## 2021-11-16 DIAGNOSIS — R150 Incomplete defecation: Secondary | ICD-10-CM | POA: Diagnosis not present

## 2021-11-16 DIAGNOSIS — M6281 Muscle weakness (generalized): Secondary | ICD-10-CM | POA: Insufficient documentation

## 2021-11-16 DIAGNOSIS — R293 Abnormal posture: Secondary | ICD-10-CM | POA: Diagnosis not present

## 2021-11-16 DIAGNOSIS — R279 Unspecified lack of coordination: Secondary | ICD-10-CM | POA: Insufficient documentation

## 2021-11-16 NOTE — Patient Instructions (Signed)
? ? ?Moisturizers ?They are used in the vagina to hydrate the mucous membrane that make up the vaginal canal. ?Designed to keep a more normal acid balance (ph) ?Once placed in the vagina, it will last between two to three days.  ?Use 2-3 times per week at bedtime  ?Ingredients to avoid is glycerin and fragrance, can increase chance of infection ?Should not be used just before sex due to causing irritation ?Most are gels administered either in a tampon-shaped applicator or as a vaginal suppository. They are non-hormonal. ? ? ?Types of Moisturizers(internal use) ? ?Vitamin E vaginal suppositories- Whole foods, Amazon ?Moist Again ?Coconut oil- can break down condoms ?Julva- (Do no use if on Tamoxifen) amazon ?Yes moisturizer- amazon ?NeuEve Silk , NeuEve Silver for menopausal or over 65 (if have severe vaginal atrophy or cancer treatments use NeuEve Silk for  1 month than move to The Pepsi)- [employer], Bronson.com ?Olive and Bee intimate cream- www.oliveandbee.com.au ?Mae vaginal moisturizer- Amazon ?Aloe ? ? ? ?Creams to use externally on the Vulva area ?Desert Sara Lee (good for for cancer patients that had radiation to the area)- [place] or [employer].FlyingBasics.com.br ?V-magic cream - amazon ?Julva-amazon ?Vital "V Wild Yam salve ( help moisturize and help with thinning vulvar area, does have Beeswax ?[place] ?Desert Conseco ?Cleo by Damiva labial moisturizer ([place],  ?Coconut or olive oil ?aloe ? ? ?Things to avoid in the vaginal area ?Do not use things to irritate the vulvar area ?No lotions just specialized creams for the vulva area- Neogyn, V-magic, No soaps; can use Aveeno or Calendula cleanser if needed. Must be gentle ?No deodorants ?No douches ?Good to sleep without underwear to let the vaginal area to air out ?No scrubbing: spread the lips to let warm water rinse over labias and pat dry ? ? ?Lubrication ?Used for intercourse to reduce friction ?Avoid ones  that have glycerin, nonoxynol-9, petroleum, propylene glycol, chlorhexidine gluconate, warming gels, tingling gels, icing or cooling gel, scented ?Avoid parabens due to a preservative similar to female sex hormone ?May need to be reapplied once or several times during sexual activity ?Can be applied to both partners genitals prior to vaginal penetration to minimize friction or irritation ?Prevent irritation and mucosal tears that cause post coital pain and increased the risk of vaginal and urinary tract infections ?Oil-based lubricants cannot be used with condoms due to breaking them down.  Least likely to irritate vaginal tissue.  ?Plant based-lubes are safe ?Silicone-based lubrication are thicker and last long and used for post-menopausal women ? ?Vaginal Lubricators ?Here is a list of some suggested lubricators you can use for intercourse. Use the most hypoallergenic product.  You can place on you or your partner.  ?Slippery Stuff ( water based) ?Sylk or Sliquid Natural H2O ( good  if frequent UTI?s)- walmart, amazon ?Sliquid organics silk-(aloe and silicone based ) ?Blossom Organics (www.blossom-organics.com)- (aloe based ) ?Coconut oil, olive oil -not good with condoms  ?PJur Woman Nude- (water based) amazon ?Uberlube- ( silicon) Amazon ?Aloe Vera- Sprouts has an organic one ?Yes lubricant- (water based and has plant oil based similar to silicone) Amazon ?Wet Platinum-Silicone, Target, Walgreens ?Olive and Bee intimate cream-  www.oliveandbee.com.au ?[place] ?Wet stuff ?Erosense Sync- walmart, amazon ?Coconu- FailLinks.co.uk ? ?Things to avoid in lubricants are glycerin, warming gels, tingling gels, icing or cooling ? gels, and scented gels.  Also avoid Vaseline. ?KY jelly, Replens, and Astroglide contain chlorhexidine which kills good bacteria(lactobacilli) ? ?Things to avoid  in the vaginal area ?Do not use things to irritate the vulvar area ?No lotions- see below ?Soaps you  can use :Aveeno, Calendula, Good  Clean Love cleanser if needed. Must be gentle ?No deodorants ?No douches ?Good to sleep without underwear to let the vaginal area to air out ?No scrubbing: spread the lips to let warm water rinse over labias and pat dry ? ?Creams that can be used on the Vulva Area ?V magic-amazon, walmart ?[place] ?Julva- Amazon ?MoonMaid Botanical Pro-Meno Wild Yam Cream ?Coconut oil, olive oil ?Cleo by Damiva labial moisturizer -[place],  ?Desert Havest Releveum ( lidocaine) or Desert Conseco ?Yes Moisturizer ? ?   ?

## 2021-11-16 NOTE — Therapy (Signed)
?OUTPATIENT PHYSICAL THERAPY FEMALE PELVIC EVALUATION ? ? ?Patient Name: Cynthia [name] ?MRN: [id number] ?DOB:06-10-1966, 56 y.o., female ?Today's Date: 11/16/2021 ? ? PT End of Session - 11/16/21 1527   ? ? Visit Number 1   ? Date for PT Re-Evaluation 02/15/22   ? Authorization Type [place] [facility]   ? PT Start Time 2992   ? PT Stop Time 1524   ? PT Time Calculation (min) 39 min   ? Activity Tolerance Patient tolerated treatment well   ? Behavior During Therapy [facility] for tasks assessed/performed   ? ?  ?  ? ?  ? ? ?Past Medical History:  ?Diagnosis Date  ? Allergy   ? 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 COVID-19   ? DISEASE, HYPERTENSIVE HEART, BENIGN, W/HF 11/07/2006  ? Qualifier: Diagnosis of  By: Nat Math    ? GERD (gastroesophageal reflux disease)   ? HIV (human immunodeficiency virus infection) ([place])   ? HIV infection ([place])   ? Hypertension   ? ?Past Surgical History:  ?Procedure Laterality Date  ? EYE SURGERY    ? Six surgeries  ? FINGER SURGERY  05/2010  ? "SCREWS IN AND OUT OF FINGER"  ? LASER VAPORIZATION OF OF CONDYLOMA    ? MANDIBLE SURGERY    ? NOSE SURGERY    ? ?Patient Active Problem List  ? Diagnosis Date Noted  ? Encounter for medical examination to establish care 06/23/2021  ? Sciatica of left side 06/23/2021  ? Gastroesophageal reflux disease 06/23/2021  ? Weight gain 06/23/2021  ? Hyperlipidemia 06/23/2021  ? Vitamin D deficiency 06/23/2021  ? Avulsion of ligament with bony fragment of lateral malleolus 02/15/2021  ? Gastroenteritis 11/17/2020  ? Status post intraocular lens implant 09/10/2013  ? 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 Hypertension   ? Presence of intraocular lens 08/23/2011  ? Retinal detachment of right eye with multiple breaks 08/23/2011  ? Asymptomatic HIV infection, CD4 >=500 ([place]) 11/07/2006  ? ? ?PCP: Orma Render, NP ? ?REFERRING PROVIDER: Tamela Gammon, NP ? ?REFERRING DIAG: R15.0 (ICD-10-CM) - Incomplete defecation ? ?THERAPY DIAG:  ?Muscle weakness (generalized) ? ?Unspecified lack of  coordination ? ?Abnormal posture ? ?ONSET DATE: one year ? ?SUBJECTIVE:                                                                                                                                                                                          ? ?SUBJECTIVE STATEMENT: ?Pt reports she sometimes feels like she needs to splint to have a BM, worse at the end of the day.  ? ?Fluid intake: Yes: 12oz of  water and one large coffee   ? ?Patient confirms identification and approves PT to assess pelvic floor and treatment Yes ? ? ?PAIN:  ?Are you having pain? No ? ? ?PRECAUTIONS: None ? ?WEIGHT BEARING RESTRICTIONS No ? ?FALLS:  ?Has patient fallen in last 6 months? No ? ?LIVING ENVIRONMENT: ?Lives with: lives with their family ?Lives in: House/apartment ? ? ?OCCUPATION: works for [employer]  ? ?PLOF: Independent ? ?PATIENT GOALS to have complete BMs ? ?PERTINENT HISTORY:  ?[id number], HIV infection (2000) managed by ID, on antivirals, currently undetected. ?Sexual abuse: No ? ?BOWEL MOVEMENT ?Pain with bowel movement: No ?Type of bowel movement:Type (Bristol Stool Scale) 4, Frequency 2-3x per day, Strain Yes, and Splinting yes  ?Fully empty rectum: No ?Leakage: No ?Pads: No ?Fiber supplement: No ? ?URINATION ?Pain with urination: No ?Fully empty bladder: No ?Stream: Strong ?Urgency: Yes:   ?Frequency: no ?Leakage: Urge to void, Coughing, Sneezing, Laughing, and Lifting ?Pads: No not usually but with cold will ? ?INTERCOURSE ?Pain with intercourse: Initial Penetration and During Penetration due to dryness ?Ability to have vaginal penetration:  Yes:   ?Climax: not painful ?Marinoff Scale: 0/3 ? ?PREGNANCY ?Vaginal deliveries 1 ?Tearing Yes: episiotomy  ?C-section deliveries 0 ?Currently pregnant No ? ?PROLAPSE ?None ? ? ? ?OBJECTIVE:  ? ?DIAGNOSTIC FINDINGS:  ? ?COGNITION: ? Overall cognitive status: Within functional limits for tasks assessed   ?  ?SENSATION: ? Light touch: Appears intact ? Proprioception: Appears  intact ? ?MUSCLE LENGTH: ?Hamstrings and adductors limited by 25% ? ? ? ?POSTURE:  ?WFL ? ?LUMBARAROM/PROM ? ?Mildly tight in flexion and side bending however functional and without pain ? ?LE ROM: ? ?Bil LE WFL ? ?LE MMT: ? ?Bil hip abduction 4/5 all others 5/5 ? ?PELVIC MMT: ?  ?MMT  ?11/16/2021  ?Vaginal 3/5; 6 reps; 4s holds  ?Internal Anal Sphincter   ?External Anal Sphincter   ?Puborectalis   ?Diastasis Recti   ?(Blank rows = not tested) ? ?      PALPATION: ?  General  mild fascial restrictions noted in upper quadrants of abdomen ? ?              External Perineal Exam WFL, mild dryness ?              ?              Internal Pelvic Floor no TTP ? ?TONE: ?WFL ? ?PROLAPSE: ?None seen in hooklying  ? ?TODAY'S TREATMENT  ? ?11/16/2021 EVAL Examination completed, findings reviewed, pt educated on POC, HEP, voiding mechanics, feminine moisturizers and lubricants. Pt motivated to participate in PT and agreeable to attempt recommendations. Pt given handouts and educated on these topics and denied additional questions.  ? ? ? ?PATIENT EDUCATION:  ?Education details: [id number], voiding mechanics, feminine moisturizers and lubricants ?Person educated: Patient ?Education method: Explanation, Demonstration, Tactile cues, Verbal cues, and Handouts ?Education comprehension: verbalized understanding and returned demonstration ? ? ?HOME EXERCISE PROGRAM: ?[place] ? ?ASSESSMENT: ? ?CLINICAL IMPRESSION: ?Patient is a 56 y.o. female  who was seen today for physical therapy evaluation and treatment for incomplete defecation. During evaluation intake, pt reports she also has urinary leakage with sneezing/laughing/coughing, strong urgency and inability to get to bathroom quickly enough, lifting and only wears pads with a cold due to coughing more. Pt also reports she does have pain with intercourse due to dryness. Pt consented to internal vaginal assessment this date, found to have decreased strength, endurance, and coordination with  need of  cues to decreased compensatory strategies. Pt attempts to hold breath during all attempts to contract pelvic floor, max cues and pt demonstrated improvement. Pt did have signs of prolapse with testing in hooklying. PT also had mild decreased flexibility with spine and bil hips. Pt would benefit from additional PT to further address deficits.   ? ?OBJECTIVE IMPAIRMENTS decreased coordination, decreased endurance, decreased strength, increased fascial restrictions, impaired flexibility, improper body mechanics, and postural dysfunction.  ? ?ACTIVITY LIMITATIONS community activity and intercourse .  ? ?PERSONAL FACTORS Time since onset of injury/illness/exacerbation and 1 comorbidity: one vaginal birth with episiotomy   are also affecting patient's functional outcome.  ? ? ?REHAB POTENTIAL: Good ? ?CLINICAL DECISION MAKING: Stable/uncomplicated ? ?EVALUATION COMPLEXITY: Low ? ? ?GOALS: ?Goals reviewed with patient? Yes ? ?SHORT TERM GOALS: Target date: 12/14/2021 ? ?Pt to be I with HEP. ?Baseline: ?Goal status: INITIAL ? ?2.  Pt to demonstrate at least 5/5 bil hip strength for improved pelvic stability and functional squats without leakage. ? ?Baseline:  ?Goal status: INITIAL ? ?3.  Pt to be I with recall of improved voiding mechanics and breathing mechanics to decrease straining and improve bowel habits to complete defecation.  ?Baseline:  ?Goal status: INITIAL ? ? ? ?LONG TERM GOALS: Target date: 02/15/2022 ? ?Pt to be I with advanced HEP. ?Baseline:  ?Goal status: INITIAL ? ?2.  Pt to report improvement with voiding mechanics and decreased need of splinting or straining by at least 75% to improve bowel habits.  ?Baseline:  ?Goal status: INITIAL ? ?3.  Pt to report no more than one urinary leakage instance in a week to decrease symptoms.  ?Baseline:  ?Goal status: INITIAL ? ?4.  Pt to demonstrate at least 4/5 pelvic floor strength for improved pelvic stability and decreased strain at pelvic floor/ decrease  leakage.  ?Baseline:  ?Goal status: INITIAL ? ? ?PLAN: ?PT FREQUENCY:  every other week  ? ?PT DURATION:  4 sessions ? ?PLANNED INTERVENTIONS: Therapeutic exercises, Therapeutic activity, Neuromuscular re-education,

## 2021-12-04 ENCOUNTER — Other Ambulatory Visit (HOSPITAL_COMMUNITY): Payer: Self-pay

## 2021-12-11 ENCOUNTER — Other Ambulatory Visit (HOSPITAL_COMMUNITY): Payer: Self-pay

## 2021-12-14 ENCOUNTER — Other Ambulatory Visit (HOSPITAL_COMMUNITY): Payer: Self-pay

## 2021-12-15 ENCOUNTER — Other Ambulatory Visit (HOSPITAL_COMMUNITY): Payer: Self-pay

## 2021-12-28 ENCOUNTER — Other Ambulatory Visit (HOSPITAL_COMMUNITY): Payer: Self-pay

## 2022-01-04 ENCOUNTER — Other Ambulatory Visit (HOSPITAL_COMMUNITY): Payer: Self-pay

## 2022-01-07 ENCOUNTER — Encounter: Payer: Self-pay | Admitting: Infectious Diseases

## 2022-01-30 ENCOUNTER — Other Ambulatory Visit: Payer: Self-pay | Admitting: Internal Medicine

## 2022-01-30 ENCOUNTER — Other Ambulatory Visit (HOSPITAL_COMMUNITY): Payer: Self-pay

## 2022-01-30 DIAGNOSIS — B20 Human immunodeficiency virus [HIV] disease: Secondary | ICD-10-CM

## 2022-01-30 MED ORDER — DESCOVY 200-25 MG PO TABS
1.0000 | ORAL_TABLET | Freq: Every day | ORAL | 3 refills | Status: DC
  Filled 2022-02-09: qty 30, 30d supply, fill #0
  Filled 2022-03-01: qty 30, 30d supply, fill #1
  Filled 2022-03-27: qty 30, 30d supply, fill #2

## 2022-01-30 MED ORDER — DORAVIRINE 100 MG PO TABS
100.0000 mg | ORAL_TABLET | Freq: Every day | ORAL | 3 refills | Status: DC
  Filled 2022-02-09: qty 30, 30d supply, fill #0
  Filled 2022-03-01: qty 30, 30d supply, fill #1
  Filled 2022-03-27: qty 30, 30d supply, fill #2

## 2022-02-09 ENCOUNTER — Other Ambulatory Visit (HOSPITAL_COMMUNITY): Payer: Self-pay

## 2022-02-22 ENCOUNTER — Other Ambulatory Visit: Payer: Self-pay | Admitting: Nurse Practitioner

## 2022-02-22 DIAGNOSIS — Z1231 Encounter for screening mammogram for malignant neoplasm of breast: Secondary | ICD-10-CM

## 2022-03-01 ENCOUNTER — Other Ambulatory Visit (HOSPITAL_COMMUNITY): Payer: Self-pay

## 2022-03-08 ENCOUNTER — Other Ambulatory Visit (HOSPITAL_COMMUNITY): Payer: Self-pay

## 2022-03-27 ENCOUNTER — Other Ambulatory Visit (HOSPITAL_COMMUNITY): Payer: Self-pay

## 2022-04-05 ENCOUNTER — Ambulatory Visit
Admission: RE | Admit: 2022-04-05 | Discharge: 2022-04-05 | Disposition: A | Discharge status: Home / Self Care | Payer: 59 | Source: Ambulatory Visit

## 2022-04-05 DIAGNOSIS — Z1231 Encounter for screening mammogram for malignant neoplasm of breast: Secondary | ICD-10-CM | POA: Diagnosis not present

## 2022-04-06 ENCOUNTER — Other Ambulatory Visit (HOSPITAL_COMMUNITY): Payer: Self-pay

## 2022-04-11 DIAGNOSIS — H5212 Myopia, left eye: Secondary | ICD-10-CM | POA: Diagnosis not present

## 2022-04-11 DIAGNOSIS — H52222 Regular astigmatism, left eye: Secondary | ICD-10-CM | POA: Diagnosis not present

## 2022-04-11 DIAGNOSIS — H524 Presbyopia: Secondary | ICD-10-CM | POA: Diagnosis not present

## 2022-04-12 ENCOUNTER — Encounter: Payer: Self-pay | Admitting: Internal Medicine

## 2022-04-12 ENCOUNTER — Other Ambulatory Visit: Payer: Self-pay

## 2022-04-12 ENCOUNTER — Ambulatory Visit: Payer: 59 | Admitting: Internal Medicine

## 2022-04-12 VITALS — BP 104/71 | HR 86 | Temp 97.9°F | Resp 16 | Ht 65.0 in | Wt 140.2 lb

## 2022-04-12 DIAGNOSIS — B20 Human immunodeficiency virus [HIV] disease: Secondary | ICD-10-CM

## 2022-04-12 DIAGNOSIS — Z23 Encounter for immunization: Secondary | ICD-10-CM | POA: Diagnosis not present

## 2022-04-12 NOTE — Progress Notes (Unsigned)
RFV: follow up for hiv disease- annual visit  Patient ID: Cynthia [name], female   DOB: 11-26-1965, 56 y.o.   MRN: [id number]  HPI 56yo F currently on descovy-doravirine  Had GERD with meds but improved if takes meds at bedtime Left side of neck intermittently swollen  Outpatient Encounter Medications as of 04/12/2022  Medication Sig   Cholecalciferol (VITAMIN D) 50 MCG (2000 UT) tablet Take 1 tablet (2,000 Units total) by mouth daily.   doravirine (PIFELTRO) 100 MG TABS tablet Take 1 tablet (100 mg total) by mouth daily.   emtricitabine-tenofovir AF (DESCOVY) 200-25 MG tablet Take 1 tablet by mouth daily.   lisinopril (ZESTRIL) 10 MG tablet TAKE 1 TABLET BY MOUTH DAILY   rosuvastatin (CRESTOR) 10 MG tablet Take 1 tablet by mouth daily.   ondansetron (ZOFRAN-ODT) 4 MG disintegrating tablet Take 1 tablet (4 mg total) by mouth every 8 (eight) hours as needed for nausea or vomiting. (Patient not taking: Reported on 04/12/2022)   No facility-administered encounter medications on file as of 04/12/2022.     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[place])    Hypertension    Presence of intraocular lens 08/23/2011   Retinal detachment of right eye with multiple breaks 08/23/2011   Asymptomatic HIV infection, CD4 >=500 ([place]) 11/07/2006     Health Maintenance Due  Topic Date Due   Zoster Vaccines- Shingrix (1 of 2) Never done   PAP SMEAR-Modifier  07/17/2019   COVID-19 Vaccine (4 - Pfizer risk series) 07/22/2020   INFLUENZA VACCINE  03/06/2022     Review of Systems  Physical  Exam   Resp 16   Ht '5\' 5"'$  (1.651 m)   Wt 140 lb 3.2 oz (63.6 kg)   LMP 05/07/2019   BMI 23.33 kg/m    Lab Results  Component Value Date   CD4TCELL 49 04/11/2021   Lab Results  Component Value Date   CD4TABS 701 04/11/2021   CD4TABS 890 01/26/2021   CD4TABS 601 07/27/2020   Lab Results  Component Value Date   HIV1RNAQUANT Not Detected 04/11/2021   Lab Results  Component Value Date   HEPBSAB No 09/30/2006   Lab Results  Component Value Date   LABRPR NON-REACTIVE 07/27/2020    CBC Lab Results  Component Value Date   WBC 5.3 04/11/2021   RBC 3.71 (L) 04/11/2021   HGB 11.9 04/11/2021   HCT 34.6 (L) 04/11/2021   PLT 216 04/11/2021   MCV 93.3 04/11/2021   MCH 32.1 04/11/2021   MCHC 34.4 04/11/2021   RDW 12.9 04/11/2021   LYMPHSABS 1,505 04/11/2021   MONOABS 0.4 02/13/2021   EOSABS 122 04/11/2021    BMET Lab Results  Component Value Date   NA 138 04/11/2021   K 4.6 04/11/2021   CL 104 04/11/2021   CO2 27 04/11/2021   GLUCOSE 88 04/11/2021   BUN 28 (H) 04/11/2021   CREATININE 1.23 (H) 04/11/2021   CALCIUM 9.6 04/11/2021   GFRNONAA 43 (L) 02/13/2021   GFRAA 54 (L) 01/26/2021      Assessment and Plan Hiv disease= will check labs  today to see that she is undetectable. If undetectable, will give refills Still hot flashes post-menopausal: Will get bone scan to looks for osteoporesis Neck fullness = even.  Health maintenance = will give her prevnar 20

## 2022-04-13 LAB — T-HELPER CELL (CD4) - (RCID CLINIC ONLY)
CD4 % Helper T Cell: 52 % (ref 33–65)
CD4 T Cell Abs: 764 /uL (ref 400–1790)

## 2022-04-14 LAB — COMPLETE METABOLIC PANEL WITH GFR
AG Ratio: 1.7 (calc) (ref 1.0–2.5)
ALT: 10 U/L (ref 6–29)
AST: 14 U/L (ref 10–35)
Albumin: 4.5 g/dL (ref 3.6–5.1)
Alkaline phosphatase (APISO): 51 U/L (ref 37–153)
BUN/Creatinine Ratio: 20 (calc) (ref 6–22)
BUN: 21 mg/dL (ref 7–25)
CO2: 29 mmol/L (ref 20–32)
Calcium: 9.7 mg/dL (ref 8.6–10.4)
Chloride: 104 mmol/L (ref 98–110)
Creat: 1.07 mg/dL — ABNORMAL HIGH (ref 0.50–1.03)
Globulin: 2.6 g/dL (calc) (ref 1.9–3.7)
Glucose, Bld: 142 mg/dL — ABNORMAL HIGH (ref 65–99)
Potassium: 4 mmol/L (ref 3.5–5.3)
Sodium: 139 mmol/L (ref 135–146)
Total Bilirubin: 0.4 mg/dL (ref 0.2–1.2)
Total Protein: 7.1 g/dL (ref 6.1–8.1)
eGFR: 62 mL/min/{1.73_m2} (ref >=60)

## 2022-04-14 LAB — CBC WITH DIFFERENTIAL/PLATELET
Absolute Monocytes: 342 cells/uL (ref 200–950)
Basophils Absolute: 29 cells/uL (ref 0–200)
Basophils Relative: 0.5 %
Eosinophils Absolute: 162 cells/uL (ref 15–500)
Eosinophils Relative: 2.8 %
HCT: 37.6 % (ref 35.0–45.0)
Hemoglobin: 12.9 g/dL (ref 11.7–15.5)
Lymphs Abs: 1502 cells/uL (ref 850–3900)
MCH: 32.6 pg (ref 27.0–33.0)
MCHC: 34.3 g/dL (ref 32.0–36.0)
MCV: 94.9 fL (ref 80.0–100.0)
MPV: 9.6 fL (ref 7.5–12.5)
Monocytes Relative: 5.9 %
Neutro Abs: 3764 cells/uL (ref 1500–7800)
Neutrophils Relative %: 64.9 %
Platelets: 235 10*3/uL (ref 140–400)
RBC: 3.96 10*6/uL (ref 3.80–5.10)
RDW: 11.4 % (ref 11.0–15.0)
Total Lymphocyte: 25.9 %
WBC: 5.8 10*3/uL (ref 3.8–10.8)

## 2022-04-14 LAB — HIV-1 RNA QUANT-NO REFLEX-BLD
HIV 1 RNA Quant: NOT DETECTED Copies/mL
HIV-1 RNA Quant, Log: NOT DETECTED Log cps/mL

## 2022-04-14 LAB — RPR: RPR Ser Ql: NONREACTIVE

## 2022-04-15 MED ORDER — DORAVIRINE 100 MG PO TABS
100.0000 mg | ORAL_TABLET | Freq: Every day | ORAL | 11 refills | Status: DC
  Filled 2022-04-15 – 2022-04-27 (×2): qty 30, 30d supply, fill #0
  Filled 2022-06-01: qty 30, 30d supply, fill #1
  Filled 2022-07-03: qty 30, 30d supply, fill #2
  Filled 2022-08-01 (×2): qty 30, 30d supply, fill #3
  Filled 2022-08-31: qty 30, 30d supply, fill #4
  Filled 2022-10-02: qty 30, 30d supply, fill #5
  Filled 2022-10-25 – 2022-11-01 (×2): qty 30, 30d supply, fill #6
  Filled 2022-11-27: qty 30, 30d supply, fill #7
  Filled 2022-12-28: qty 30, 30d supply, fill #8
  Filled 2023-01-25: qty 30, 30d supply, fill #9
  Filled 2023-02-26: qty 30, 30d supply, fill #10
  Filled 2023-03-27: qty 30, 30d supply, fill #11

## 2022-04-15 MED ORDER — DESCOVY 200-25 MG PO TABS
1.0000 | ORAL_TABLET | Freq: Every day | ORAL | 11 refills | Status: DC
  Filled 2022-04-15 – 2022-04-27 (×2): qty 30, 30d supply, fill #0
  Filled 2022-06-01: qty 30, 30d supply, fill #1
  Filled 2022-07-03: qty 30, 30d supply, fill #2
  Filled 2022-08-01 (×2): qty 30, 30d supply, fill #3
  Filled 2022-08-31: qty 30, 30d supply, fill #4
  Filled 2022-10-02: qty 30, 30d supply, fill #5
  Filled 2022-10-25 – 2022-11-01 (×2): qty 30, 30d supply, fill #6
  Filled 2022-11-27: qty 30, 30d supply, fill #7
  Filled 2022-12-28: qty 30, 30d supply, fill #8
  Filled 2023-01-25: qty 30, 30d supply, fill #9
  Filled 2023-02-26: qty 30, 30d supply, fill #10
  Filled 2023-03-27: qty 30, 30d supply, fill #11

## 2022-04-16 ENCOUNTER — Other Ambulatory Visit (HOSPITAL_COMMUNITY): Payer: Self-pay

## 2022-04-16 ENCOUNTER — Other Ambulatory Visit: Payer: Self-pay | Admitting: Internal Medicine

## 2022-04-16 DIAGNOSIS — Z0000 Encounter for general adult medical examination without abnormal findings: Secondary | ICD-10-CM

## 2022-04-27 ENCOUNTER — Other Ambulatory Visit (HOSPITAL_COMMUNITY): Payer: Self-pay

## 2022-05-01 ENCOUNTER — Other Ambulatory Visit (HOSPITAL_COMMUNITY): Payer: Self-pay

## 2022-05-02 ENCOUNTER — Other Ambulatory Visit (HOSPITAL_COMMUNITY): Payer: Self-pay

## 2022-05-03 ENCOUNTER — Other Ambulatory Visit (HOSPITAL_COMMUNITY): Payer: Self-pay

## 2022-05-10 DIAGNOSIS — Z961 Presence of intraocular lens: Secondary | ICD-10-CM | POA: Diagnosis not present

## 2022-05-10 DIAGNOSIS — Z21 Asymptomatic human immunodeficiency virus [HIV] infection status: Secondary | ICD-10-CM | POA: Diagnosis not present

## 2022-05-10 DIAGNOSIS — H33021 Retinal detachment with multiple breaks, right eye: Secondary | ICD-10-CM | POA: Diagnosis not present

## 2022-05-10 DIAGNOSIS — H26491 Other secondary cataract, right eye: Secondary | ICD-10-CM | POA: Diagnosis not present

## 2022-05-30 ENCOUNTER — Other Ambulatory Visit (HOSPITAL_COMMUNITY): Payer: Self-pay

## 2022-05-31 ENCOUNTER — Other Ambulatory Visit (HOSPITAL_COMMUNITY): Payer: Self-pay

## 2022-06-01 ENCOUNTER — Other Ambulatory Visit (HOSPITAL_COMMUNITY): Payer: Self-pay

## 2022-06-05 DIAGNOSIS — L578 Other skin changes due to chronic exposure to nonionizing radiation: Secondary | ICD-10-CM | POA: Diagnosis not present

## 2022-06-05 DIAGNOSIS — L814 Other melanin hyperpigmentation: Secondary | ICD-10-CM | POA: Diagnosis not present

## 2022-06-05 DIAGNOSIS — L821 Other seborrheic keratosis: Secondary | ICD-10-CM | POA: Diagnosis not present

## 2022-06-05 DIAGNOSIS — D225 Melanocytic nevi of trunk: Secondary | ICD-10-CM | POA: Diagnosis not present

## 2022-06-07 ENCOUNTER — Other Ambulatory Visit (HOSPITAL_COMMUNITY): Payer: Self-pay

## 2022-07-03 ENCOUNTER — Other Ambulatory Visit (HOSPITAL_COMMUNITY): Payer: Self-pay

## 2022-07-06 ENCOUNTER — Other Ambulatory Visit (HOSPITAL_COMMUNITY): Payer: Self-pay

## 2022-07-09 ENCOUNTER — Other Ambulatory Visit (HOSPITAL_COMMUNITY): Payer: Self-pay

## 2022-07-09 ENCOUNTER — Encounter: Payer: Self-pay | Admitting: Nurse Practitioner

## 2022-07-09 ENCOUNTER — Ambulatory Visit (INDEPENDENT_AMBULATORY_CARE_PROVIDER_SITE_OTHER): Payer: 59 | Admitting: Nurse Practitioner

## 2022-07-09 VITALS — BP 112/72 | HR 78 | Temp 98.4°F | Ht 65.5 in | Wt 143.8 lb

## 2022-07-09 DIAGNOSIS — Z0000 Encounter for general adult medical examination without abnormal findings: Secondary | ICD-10-CM

## 2022-07-09 DIAGNOSIS — Z21 Asymptomatic human immunodeficiency virus [HIV] infection status: Secondary | ICD-10-CM

## 2022-07-09 DIAGNOSIS — Z23 Encounter for immunization: Secondary | ICD-10-CM

## 2022-07-09 DIAGNOSIS — R7309 Other abnormal glucose: Secondary | ICD-10-CM

## 2022-07-09 DIAGNOSIS — R635 Abnormal weight gain: Secondary | ICD-10-CM | POA: Diagnosis not present

## 2022-07-09 DIAGNOSIS — E785 Hyperlipidemia, unspecified: Secondary | ICD-10-CM | POA: Diagnosis not present

## 2022-07-09 DIAGNOSIS — I10 Essential (primary) hypertension: Secondary | ICD-10-CM | POA: Diagnosis not present

## 2022-07-09 DIAGNOSIS — R5383 Other fatigue: Secondary | ICD-10-CM | POA: Diagnosis not present

## 2022-07-09 MED ORDER — ROSUVASTATIN CALCIUM 10 MG PO TABS
10.0000 mg | ORAL_TABLET | Freq: Every day | ORAL | 3 refills | Status: DC
  Filled 2022-07-09: qty 30, 30d supply, fill #0
  Filled 2022-08-31: qty 90, 90d supply, fill #0
  Filled 2022-12-28: qty 90, 90d supply, fill #1
  Filled 2023-04-11: qty 90, 90d supply, fill #2
  Filled 2023-07-16: qty 90, 90d supply, fill #3

## 2022-07-09 MED ORDER — ZOSTER VAC RECOMB ADJUVANTED 50 MCG/0.5ML IM SUSR: 0.5000 mL | Freq: Once | INTRAMUSCULAR | 1 refills | Status: AC

## 2022-07-09 MED ORDER — LISINOPRIL 10 MG PO TABS
10.0000 mg | ORAL_TABLET | Freq: Every day | ORAL | 3 refills | Status: DC
  Filled 2022-07-09: qty 90, 90d supply, fill #0
  Filled 2022-10-04: qty 90, 90d supply, fill #1
  Filled 2022-12-28: qty 90, 90d supply, fill #2
  Filled 2023-04-11: qty 90, 90d supply, fill #3

## 2022-07-09 NOTE — Progress Notes (Signed)
Cynthia Keeler, DNP, AGNP-c [place], [place] [address] Main Office [phone]  BP 112/72   Pulse 78   Temp 98.4 F (36.9 C)   Ht 5' 5.5" (1.664 m)   Wt 143 lb 12.8 oz (65.2 kg)   LMP 05/07/2019   BMI 23.57 kg/m    Subjective:    Patient ID: Cynthia [name], female    DOB: Dec 23, 1965, 56 y.o.   MRN: [id number]  HPI: Cynthia [name] is a 56 y.o. female presenting on 07/09/2022 for comprehensive medical examination.   Current medical concerns include: Weight She tells me that she has had significant weight gain since menopause.She tells me that she was 117lb for years, but her weight has slowly creped up. She endorses "middle" weight gain.  She tells me that she gets thirsty in the middle of the night quite often, but denies increased hunger or urination.   She reports regular vision exams q1-5y: Yes  She reports regular dental exams q 59m  Yes  The patient eats a regular, healthy diet. She endorses exercise and/or activity of:  she works on walking daily and she gets her steps in daily. She usually takes a mile and a half walk daily and she does one class a week of exercise.  She endorses the following: Marital Status: married Occupation: AMusicianfor [employer] She denies concerns with STI today, testing was not ordered  A comprehensive review of systems was negative.  Most Recent Depression Screen:     07/09/2022    9:03 AM 04/12/2022    2:52 PM 06/23/2021    1:48 PM 01/26/2021    3:31 PM 07/27/2020    8:48 AM  Depression screen PHQ 2/9  Decreased Interest 0 0 0 0 0  Down, Depressed, Hopeless 0 0 0 0 0  PHQ - 2 Score 0 0 0 0 0  Altered sleeping   1    Tired, decreased energy   1    Change in appetite   0    Feeling bad or failure about yourself    0    Trouble concentrating   0    Moving slowly or fidgety/restless   0    Suicidal thoughts   0    PHQ-9 Score   2    Difficult doing work/chores   Not  difficult at all     Most Recent Anxiety Screen:     06/23/2021    1:48 PM  GAD 7 : Generalized Anxiety Score  Nervous, Anxious, on Edge 0  Control/stop worrying 1  Worry too much - different things 1  Trouble relaxing 0  Restless 0  Easily annoyed or irritable 1  Afraid - awful might happen 0  Total GAD 7 Score 3  Anxiety Difficulty Not difficult at all   Most Recent Fall Screen:    07/09/2022    9:03 AM 04/12/2022    2:52 PM 06/23/2021    1:47 PM 04/11/2021    3:03 PM 01/26/2021    3:31 PM  Fall Risk   Falls in the past year? 0 0 1 1 0  Number falls in past yr: 0 0 0 0   Injury with Fall? 0 0 1 1   Risk for fall due to : No Fall Risks No Fall Risks No Fall Risks;Other (Comment) History of fall(s)   Risk for fall due to: Comment   Most recent fall incidental.    Follow  up Falls evaluation completed Falls evaluation completed Falls evaluation completed;Education provided Education provided     Past medical history, surgical history, medications, allergies, family history and social history reviewed with patient today and changes made to appropriate areas of the chart.  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   doravirine (PIFELTRO) 100 MG TABS tablet Take 1 tablet (100 mg total) by mouth daily.   emtricitabine-tenofovir AF (DESCOVY) 200-25 MG tablet Take 1 tablet by mouth daily.   No current facility-administered medications on file prior to visit.   Surgical History:  Past  Surgical History:  Procedure Laterality Date   EYE SURGERY     Six surgeries   FINGER SURGERY  05/2010   "SCREWS IN AND OUT OF FINGER"   LASER VAPORIZATION OF OF CONDYLOMA     MANDIBLE SURGERY     NOSE SURGERY     Allergies:  Allergies  Allergen Reactions   Dapsone Shortness Of Breath   Amoxicillin Rash   Nitrofurantoin Nausea And Vomiting and Rash   Family History:  Family History  Problem Relation Age of Onset   Diabetes Father    Hypertension Father    Hypertension Mother    Cancer Maternal Grandmother        COLON   Heart disease Maternal Grandmother    Colon cancer Maternal Grandmother    Breast cancer Paternal Grandmother    Diabetes Paternal Grandmother    Colon cancer Cousin    Esophageal cancer Neg Hx    Stomach cancer Neg Hx    Rectal cancer Neg Hx        Objective:    BP 112/72   Pulse 78   Temp 98.4 F (36.9 C)   Ht 5' 5.5" (1.664 m)   Wt 143 lb 12.8 oz (65.2 kg)   LMP 05/07/2019   BMI 23.57 kg/m   Wt Readings from Last 3 Encounters:  07/09/22 143 lb 12.8 oz (65.2 kg)  04/12/22 140 lb 3.2 oz (63.6 kg)  10/04/21 137 lb (62.1 kg)    Physical Exam  Results for orders placed or performed in visit on 04/12/22  CBC with Differential/Platelet  Result Value Ref Range   WBC 5.8 3.8 - 10.8 Thousand/uL   RBC 3.96 3.80 - 5.10 Million/uL   Hemoglobin 12.9 11.7 - 15.5 g/dL   HCT 37.6 35.0 - 45.0 %   MCV 94.9 80.0 - 100.0 fL   MCH 32.6 27.0 - 33.0 pg   MCHC 34.3 32.0 - 36.0 g/dL   RDW 11.4 11.0 - 15.0 %   Platelets 235 140 - 400 Thousand/uL   MPV 9.6 7.5 - 12.5 fL   Neutro Abs 3,764 1,500 - 7,800 cells/uL   Lymphs Abs 1,502 850 - 3,900 cells/uL   Absolute Monocytes 342 200 - 950 cells/uL   Eosinophils Absolute 162 15 - 500 cells/uL   Basophils Absolute 29 0 - 200 cells/uL   Neutrophils Relative %  64.9 %   Total Lymphocyte 25.9 %   Monocytes Relative 5.9 %   Eosinophils Relative 2.8 %   Basophils Relative 0.5 %  COMPLETE METABOLIC PANEL WITH  GFR  Result Value Ref Range   Glucose, Bld 142 (H) 65 - 99 mg/dL   BUN 21 7 - 25 mg/dL   Creat 1.07 (H) 0.50 - 1.03 mg/dL   eGFR 62 > OR = 60 mL/min/1.81m   BUN/Creatinine Ratio 20 6 - 22 (calc)   Sodium 139 135 - 146 mmol/L   Potassium 4.0 3.5 - 5.3 mmol/L   Chloride 104 98 - 110 mmol/L   CO2 29 20 - 32 mmol/L   Calcium 9.7 8.6 - 10.4 mg/dL   Total Protein 7.1 6.1 - 8.1 g/dL   Albumin 4.5 3.6 - 5.1 g/dL   Globulin 2.6 1.9 - 3.7 g/dL (calc)   AG Ratio 1.7 1.0 - 2.5 (calc)   Total Bilirubin 0.4 0.2 - 1.2 mg/dL   Alkaline phosphatase (APISO) 51 37 - 153 U/L   AST 14 10 - 35 U/L   ALT 10 6 - 29 U/L  HIV-1 RNA quant-no reflex-bld  Result Value Ref Range   HIV 1 RNA Quant Not Detected Copies/mL   HIV-1 RNA Quant, Log Not Detected Log cps/mL  RPR  Result Value Ref Range   RPR Ser Ql NON-REACTIVE NON-REACTIVE  T-helper cell (CD4)- ([facility] clinic only)  Result Value Ref Range   CD4 T Cell Abs 764 400 - 1,790 /uL   CD4 % Helper T Cell 52 33 - 65 %    IMMUNIZATIONS:   Flu: Flu vaccine completed elsewhere this season Prevnar 13: Prevnar 13 N/A for this patient Pneumovax 23: Pneumovax completed, documentation in chart Vac Shingrix: Shingrix due, prescription provided HPV: HPV N/A for this patient Tetanus: Tetanus completed in the last 10 years Prevnar 20: completed, documentation in chart  HEALTH MAINTENANCE: Pap Smear HM Status: patient would like these completed annually due to HIV status Mammogram HM Status: is up to date Colon Cancer Screening HM Status: is up to date Bone Density HM Status: N/A STI Testing HM Status: was declined   Eye Exam HM Status: is up to date Urine Micro HM Status: is not applicable for this patient  Spirometry HM Status: is up to date and is not applicable for this patient      Assessment & Plan:   Problem List Items Addressed This Visit     Asymptomatic HIV infection, CD4 >=500 ([facility])    Chronic.  Currently well controlled on current regimen.   Following closely with pain.  No alarm symptoms time.  Recommend continuation of current treatment and close monitoring for any new symptoms.      Relevant Medications   Zoster Vaccine Adjuvanted ([facility] injection   Hypertension    Chronic.  Well-controlled at this time.  Patient did inquire about coming off of blood pressure medication however given that she is not experiencing hypotension I do feel that medication is keeping her blood pressure from appropriate level.  We will plan to maintain medication for now.  Labs pending.      Relevant Medications   lisinopril (ZESTRIL) 10 MG tablet   rosuvastatin (CRESTOR) 10 MG tablet   Other Relevant Orders   Hemoglobin A1c   Lipid panel   Comprehensive metabolic panel   Encounter for annual physical exam - Primary    CPE today with no abnormalities noted on exam.  Labs pending. Will make changes  as necessary based on results.  Review of HM activities and recommendations discussed and provided on AVS Anticipatory guidance, diet, and exercise recommendations provided.  Medications, allergies, and hx reviewed and updated as necessary.  Plan to f/u with CPE in 1 year or sooner for acute/chronic health needs as directed.        Relevant Medications   lisinopril (ZESTRIL) 10 MG tablet   rosuvastatin (CRESTOR) 10 MG tablet   Other Relevant Orders   Hemoglobin A1c   Lipid panel   Comprehensive metabolic panel   TSH   T4, free   Weight gain    She endorses concerns with weight gain particularly in the midsection.  Unclear if weight gain is related to postmenopausal status for HIV medications.  Her BMI is currently 23.5%.  Patient was reassured that her BMI is appropriate.  Recommend dietary changes and exercise; information provided on AVS for her to review.  Labs are pending today.  We will make changes to plan of care based on laboratory findings as appropriate.      Relevant Orders   Hemoglobin A1c   Lipid panel   Comprehensive  metabolic panel   TSH   T4, free   Hyperlipidemia    Chronic.  Currently on statin therapy.  No alarm symptoms present at this time.  We will obtain labs today.      Relevant Medications   lisinopril (ZESTRIL) 10 MG tablet   rosuvastatin (CRESTOR) 10 MG tablet   Other Relevant Orders   Hemoglobin A1c   Lipid panel   Comprehensive metabolic panel   Other Visit Diagnoses     Elevated glucose level       Relevant Orders   Hemoglobin A1c   Fatigue, unspecified type       Relevant Orders   Hemoglobin A1c   Lipid panel   Comprehensive metabolic panel   TSH   T4, free   Health care maintenance       Relevant Medications   lisinopril (ZESTRIL) 10 MG tablet   rosuvastatin (CRESTOR) 10 MG tablet   Other Relevant Orders   Hemoglobin A1c   Lipid panel   Comprehensive metabolic panel   TSH   T4, free   Need for shingles vaccine       Relevant Medications   Zoster Vaccine Adjuvanted [facility]) injection          Follow up plan: Return in about 1 year (around 07/10/2023) for CPE.  NEXT PREVENTATIVE PHYSICAL DUE IN 1 YEAR.  PATIENT COUNSELING PROVIDED FOR ALL ADULT PATIENTS:  Consume a well balanced diet low in saturated fats, cholesterol, and moderation in carbohydrates.   This can be as simple as monitoring portion sizes and cutting back on sugary beverages such as soda and juice to start with.    Daily water consumption of at least 64 ounces.  Physical activity at least 180 minutes per week, if just starting out.   This can be as simple as taking the stairs instead of the elevator and walking 2-3 laps around the office  purposefully every day.   STD protection, partner selection, and regular testing if high risk.  Limited consumption of alcoholic beverages if alcohol is consumed.  For women, I recommend no more than 7 alcoholic beverages per week, spread out throughout the week.  Avoid "binge" drinking or consuming large quantities of alcohol in one setting.    Please let me know if you feel you may need help with reduction or quitting alcohol  consumption.   Avoidance of nicotine, if used.  Please let me know if you feel you may need help with reduction or quitting nicotine use.   Daily mental health attention.  This can be in the form of 5 minute daily meditation, prayer, journaling, yoga, reflection, etc.   Purposeful attention to your emotions and mental state can significantly improve your overall wellbeing and Health.  Please know that I am here to help you with all of your health care goals and am happy to work with you to find a solution that works best for you.  The greatest advice I have received with any changes in life are to take it one step at a time, that even means if all you can focus on is the next 60 seconds, then do that and celebrate your victories.  With any changes in life, you will have set backs, and that is OK. The important thing to remember is, if you have a set back, it is not a failure, it is an opportunity to try again!  Health Maintenance Recommendations Screening Testing Mammogram Every 1 -2 years based on history and risk factors Starting at age 32 Pap Smear Ages 21-39 every 3 years Ages 75-65 every 5 years with HPV testing More frequent testing may be required based on results and history Colon Cancer Screening Every 1-10 years based on test performed, risk factors, and history Starting at age 44 Bone Density Screening Every 2-10 years based on history Starting at age 69 for women Recommendations for men differ based on medication usage, history, and risk factors AAA Screening One time ultrasound Men 50-79 years old who have every smoked Lung Cancer Screening Low Dose Lung CT every 12 months Age 89-80 years with a 30 pack-year smoking history who still smoke or who have quit within the last 15 years  Screening Labs Routine  Labs: Complete Blood Count (CBC), Complete Metabolic Panel (CMP), Cholesterol  (Lipid Panel) Every 6-12 months based on history and medications May be recommended more frequently based on current conditions or previous results Hemoglobin A1c Lab Every 3-12 months based on history and previous results Starting at age 77 or earlier with diagnosis of diabetes, high cholesterol, BMI >26, and/or risk factors Frequent monitoring for patients with diabetes to ensure blood sugar control Thyroid Panel (TSH w/ T3 & T4) Every 6 months based on history, symptoms, and risk factors May be repeated more often if on medication HIV One time testing for all patients 34 and older May be repeated more frequently for patients with increased risk factors or exposure Hepatitis C One time testing for all patients 42 and older May be repeated more frequently for patients with increased risk factors or exposure Gonorrhea, Chlamydia Every 12 months for all sexually active persons 13-24 years Additional monitoring may be recommended for those who are considered high risk or who have symptoms PSA Men 49-32 years old with risk factors Additional screening may be recommended from age 14-69 based on risk factors, symptoms, and history  Vaccine Recommendations Tetanus Booster All adults every 10 years Flu Vaccine All patients 6 months and older every year COVID Vaccine All patients 12 years and older Initial dosing with booster May recommend additional booster based on age and health history HPV Vaccine 2 doses all patients age 68-26 Dosing may be considered for patients over 26 Shingles Vaccine (Shingrix) 2 doses all adults 49 years and older Pneumonia (Pneumovax 23) All adults 65 years and older May recommend earlier dosing  based on health history Pneumonia (Prevnar 80) All adults 70 years and older Dosed 1 year after Pneumovax 23  Additional Screening, Testing, and Vaccinations may be recommended on an individualized basis based on family history, health history, risk factors,  and/or exposure.

## 2022-07-09 NOTE — Assessment & Plan Note (Signed)
Chronic.  Well-controlled at this time.  Patient did inquire about coming off of blood pressure medication however given that she is not experiencing hypotension I do feel that medication is keeping her blood pressure from appropriate level.  We will plan to maintain medication for now.  Labs pending.

## 2022-07-09 NOTE — Assessment & Plan Note (Signed)
She endorses concerns with weight gain particularly in the midsection.  Unclear if weight gain is related to postmenopausal status for HIV medications.  Her BMI is currently 23.5%.  Patient was reassured that her BMI is appropriate.  Recommend dietary changes and exercise; information provided on AVS for her to review.  Labs are pending today.  We will make changes to plan of care based on laboratory findings as appropriate.

## 2022-07-09 NOTE — Assessment & Plan Note (Signed)
Chronic.  Currently well controlled on current regimen.  Following closely with pain.  No alarm symptoms time.  Recommend continuation of current treatment and close monitoring for any new symptoms.

## 2022-07-09 NOTE — Assessment & Plan Note (Signed)
Chronic.  Currently on statin therapy.  No alarm symptoms present at this time.  We will obtain labs today.

## 2022-07-09 NOTE — Patient Instructions (Addendum)
You are looking great!  WEIGHT LOSS PLANNING Your progress today shows:     07/09/2022    9:01 AM 04/12/2022    2:49 PM 11/07/2021    9:36 AM  Vitals with BMI  Height 5' 5.5" '5\' 5"'$    Weight 143 lbs 13 oz 140 lbs 3 oz   BMI 40.10 27.25   Systolic 366 440 347  Diastolic 72 71 71  Pulse 78 86 68    For best management of weight, it is vital to balance intake versus output. This means the number of calories burned per day must be less than the calories you take in with food and drink.   I recommend trying to follow a diet with the following: Calories: 1200-1500 calories per day Carbohydrates: 150-180 grams of carbohydrates per day  Why: Gives your body enough "quick fuel" for cells to maintain normal function without sending them into starvation mode.  Protein: At least 45-55 grams of protein per day Why: Protein takes longer and uses more energy than carbohydrates to break down for fuel. The carbohydrates in your meals serves as quick energy sources and proteins help use some of that extra quick energy to break down to produce long term energy. This helps you not feel hungry as quickly and protein breakdown burns calories.  Water: Drink AT LEAST 64 ounces of water per day  Why: Water is essential to healthy metabolism. Water helps to fill the stomach and keep you fuller longer. Water is required for healthy digestion and filtering of waste in the body.  Fat: Limit fats in your diet- when choosing fats, choose foods with lower fats content such as lean meats (chicken, fish, [place]).  Why: Increased fat intake leads to storage "for later". Once you burn your carbohydrate energy, your body goes into fat and protein breakdown mode to help you loose weight.  Cholesterol: Fats and oils that are LIQUID at room temperature are best. Choose vegetable oils (olive oil, avocado oil, nuts). Avoid fats that are SOLID at room temperature (animal fats, processed meats). Healthy fats are often found in whole  grains, beans, nuts, seeds, and berries.  Why: Elevated cholesterol levels lead to build up of cholesterol on the inside of your blood vessels. This will eventually cause the blood vessels to become hard and can lead to high blood pressure and damage to your organs. When the blood flow is reduced, but the pressure is high from cholesterol buildup, parts of the cholesterol can break off and form clots that can go to the brain or heart leading to a stroke or heart attack.  Fiber: Increase amount of SOLUBLE the fiber in your diet. This helps to fill you up, lowers cholesterol, and helps with digestion. Some foods high in soluble fiber are oats, peas, beans, apples, carrots, barley, and citrus fruits.   Why: Fiber fills you up, helps remove excess cholesterol, and aids in healthy digestion which are all very important in weight management.   I recommend the following as a minimum activity routine: Purposeful walk or other physical activity at least 20 minutes every single day. This means purposefully taking a walk, jog, bike, swim, treadmill, elliptical, dance, etc.  This activity should be ABOVE your normal daily activities, such as walking at work. Goal exercise should be at least 150 minutes a week- work your way up to this.   Heart Rate: Your maximum exercise heart rate should be 220 - Your Age in Years. When exercising, get your heart rate  up, but avoid going over the maximum targeted heart rate.  60-70% of your maximum heart rate is where you tend to burn the most fat. To find this number:  220 - Age In Years= Max HR  Max HR x 0.6 (or 0.7) = Fat Burning HR The Fat Burning HR is your goal heart rate while working out to burn the most fat.  NEVER exercise to the point your feel lightheaded, weak, nauseated, dizzy. If you experience ANY of these symptoms- STOP exercise! Allow yourself to cool down and your heart rate to come down. Then restart slower next time.  If at [place] you feel chest pain  or chest pressure during exercise, STOP IMMEDIATELY and seek medical attention.

## 2022-07-09 NOTE — Assessment & Plan Note (Signed)

## 2022-07-10 LAB — HEMOGLOBIN A1C
Est. average glucose Bld gHb Est-mCnc: 111 mg/dL
Hgb A1c MFr Bld: 5.5 % (ref 4.8–5.6)

## 2022-07-10 LAB — COMPREHENSIVE METABOLIC PANEL
ALT: 17 IU/L (ref 0–32)
AST: 19 IU/L (ref 0–40)
Albumin/Globulin Ratio: 2.1 (ref 1.2–2.2)
Albumin: 4.7 g/dL (ref 3.8–4.9)
Alkaline Phosphatase: 60 IU/L (ref 44–121)
BUN/Creatinine Ratio: 26 — ABNORMAL HIGH (ref 9–23)
BUN: 26 mg/dL — ABNORMAL HIGH (ref 6–24)
Bilirubin Total: 0.3 mg/dL (ref 0.0–1.2)
CO2: 22 mmol/L (ref 20–29)
Calcium: 9.7 mg/dL (ref 8.7–10.2)
Chloride: 104 mmol/L (ref 96–106)
Creatinine, Ser: 1 mg/dL (ref 0.57–1.00)
Globulin, Total: 2.2 g/dL (ref 1.5–4.5)
Glucose: 92 mg/dL (ref 70–99)
Potassium: 4.7 mmol/L (ref 3.5–5.2)
Sodium: 142 mmol/L (ref 134–144)
Total Protein: 6.9 g/dL (ref 6.0–8.5)
eGFR: 67 mL/min/{1.73_m2} (ref >59)

## 2022-07-10 LAB — LIPID PANEL
Chol/HDL Ratio: 3.5 ratio (ref 0.0–4.4)
Cholesterol, Total: 178 mg/dL (ref 100–199)
HDL: 51 mg/dL (ref >39)
LDL Chol Calc (NIH): 111 mg/dL — ABNORMAL HIGH (ref 0–99)
Triglycerides: 89 mg/dL (ref 0–149)
VLDL Cholesterol Cal: 16 mg/dL (ref 5–40)

## 2022-07-10 LAB — TSH: TSH: 1.4 u[IU]/mL (ref 0.450–4.500)

## 2022-07-10 LAB — T4, FREE: Free T4: 1.05 ng/dL (ref 0.82–1.77)

## 2022-07-31 ENCOUNTER — Other Ambulatory Visit (HOSPITAL_COMMUNITY): Payer: Self-pay

## 2022-08-01 ENCOUNTER — Other Ambulatory Visit (HOSPITAL_COMMUNITY): Payer: Self-pay

## 2022-08-02 ENCOUNTER — Other Ambulatory Visit: Payer: Self-pay

## 2022-08-07 ENCOUNTER — Other Ambulatory Visit (HOSPITAL_COMMUNITY): Payer: Self-pay

## 2022-08-07 ENCOUNTER — Telehealth: Payer: Self-pay

## 2022-08-07 ENCOUNTER — Other Ambulatory Visit: Payer: Self-pay

## 2022-08-07 NOTE — Telephone Encounter (Signed)
[facility] Patient Advocate Encounter   Was successful in obtaining a [employer] copay card for [employer].  This copay card will make the patients copay $0.00.  I have spoken with the patient.    The billing information is as follows and has been shared with [facility].  RxBin: [id number] PCN: [id number] Member ID: [id number] Group ID: [id number]  Ileene Patrick, [place] [facility] [place] [facility] Phone: [phone] Fax:  [phone]

## 2022-08-28 ENCOUNTER — Other Ambulatory Visit (HOSPITAL_COMMUNITY): Payer: Self-pay

## 2022-08-31 ENCOUNTER — Other Ambulatory Visit (HOSPITAL_COMMUNITY): Payer: Self-pay

## 2022-08-31 ENCOUNTER — Other Ambulatory Visit: Payer: Self-pay

## 2022-09-04 ENCOUNTER — Other Ambulatory Visit (HOSPITAL_COMMUNITY): Payer: Self-pay

## 2022-10-02 ENCOUNTER — Ambulatory Visit
Admission: RE | Admit: 2022-10-02 | Discharge: 2022-10-02 | Disposition: A | Discharge status: Home / Self Care | Payer: Commercial Managed Care - PPO | Source: Ambulatory Visit | Attending: Internal Medicine | Admitting: Internal Medicine

## 2022-10-02 ENCOUNTER — Other Ambulatory Visit (HOSPITAL_COMMUNITY): Payer: Self-pay

## 2022-10-02 DIAGNOSIS — M8589 Other specified disorders of bone density and structure, multiple sites: Secondary | ICD-10-CM | POA: Diagnosis not present

## 2022-10-02 DIAGNOSIS — Z0000 Encounter for general adult medical examination without abnormal findings: Secondary | ICD-10-CM

## 2022-10-02 DIAGNOSIS — Z780 Asymptomatic menopausal state: Secondary | ICD-10-CM | POA: Diagnosis not present

## 2022-10-04 ENCOUNTER — Other Ambulatory Visit (HOSPITAL_COMMUNITY): Payer: Self-pay

## 2022-10-25 ENCOUNTER — Other Ambulatory Visit (HOSPITAL_COMMUNITY): Payer: Self-pay

## 2022-11-01 ENCOUNTER — Other Ambulatory Visit: Payer: Self-pay

## 2022-11-01 ENCOUNTER — Other Ambulatory Visit (HOSPITAL_COMMUNITY): Payer: Self-pay

## 2022-11-20 ENCOUNTER — Encounter: Payer: Self-pay | Admitting: Nurse Practitioner

## 2022-11-22 ENCOUNTER — Encounter: Payer: Self-pay | Admitting: Nurse Practitioner

## 2022-11-22 ENCOUNTER — Other Ambulatory Visit (HOSPITAL_COMMUNITY)
Admission: RE | Admit: 2022-11-22 | Discharge: 2022-11-22 | Disposition: A | Discharge status: Home / Self Care | Payer: Commercial Managed Care - PPO | Source: Ambulatory Visit | Attending: Nurse Practitioner | Admitting: Nurse Practitioner

## 2022-11-22 ENCOUNTER — Other Ambulatory Visit (HOSPITAL_COMMUNITY): Payer: Self-pay

## 2022-11-22 ENCOUNTER — Ambulatory Visit (INDEPENDENT_AMBULATORY_CARE_PROVIDER_SITE_OTHER): Payer: Commercial Managed Care - PPO | Admitting: Nurse Practitioner

## 2022-11-22 VITALS — BP 108/72 | HR 86 | Ht 65.0 in | Wt 143.0 lb

## 2022-11-22 DIAGNOSIS — Z01419 Encounter for gynecological examination (general) (routine) without abnormal findings: Secondary | ICD-10-CM

## 2022-11-22 DIAGNOSIS — M8589 Other specified disorders of bone density and structure, multiple sites: Secondary | ICD-10-CM

## 2022-11-22 DIAGNOSIS — Z124 Encounter for screening for malignant neoplasm of cervix: Secondary | ICD-10-CM | POA: Diagnosis not present

## 2022-11-22 DIAGNOSIS — Z780 Asymptomatic menopausal state: Secondary | ICD-10-CM

## 2022-11-22 NOTE — Progress Notes (Signed)
   Cynthia [name] 12-08-1965 [id number]   History:  57 y.o. G4P0031 presents for annual exam. Postmenopausal - no HRT. 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 Normal mammogram history. HIV infection (2000) managed by ID, on antivirals, currently undetectable. HTN, HLD managed by PCP.   Gynecologic History No LMP recorded. Patient is postmenopausal.   Contraception/Family planning: post menopausal status Sexually active: Yes  Health Maintenance Last Pap: 08/29/2020. Results were: Normal, 3-year repeat Last mammogram: 04/05/2022. Results were: Normal Last colonoscopy: 04/02/2018. Results were: Polyp. 5-year recall Last Dexa: 10/02/2022. Results were: T-score -1.7, FRAX 6.5% / 0.5%  Past medical history, past surgical history, family history and social history were all reviewed and documented in the EPIC chart. 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 59 yo daughter, 4 children ages 41 months, 9, 8, and 62 yo. SAHM, home schooling.   ROS:  A ROS was performed and pertinent positives and negatives are included.  Exam:  Vitals:   11/22/22 1354  BP: 108/72  Pulse: 86  SpO2: 97%  Weight: 143 lb (64.9 kg)  Height:  (1.651 m)     Body mass index is 23.8 kg/m.  General appearance:  Normal Thyroid:  Symmetrical, normal in size, without palpable masses or nodularity. Respiratory  Auscultation:  Clear without wheezing or rhonchi Cardiovascular  Auscultation:  Regular rate, without rubs, murmurs or gallops  Edema/varicosities:  Not grossly evident Abdominal  Soft,nontender, without masses, guarding or rebound.  Liver/spleen:  No organomegaly noted  Hernia:  None appreciated  Skin  Inspection:  Grossly normal Breasts: Examined lying and sitting.   Right: Without masses, retractions, discharge or axillary adenopathy.   Left: Without masses, retractions, discharge or axillary adenopathy. Genitourinary   Inguinal/mons:  Normal without inguinal adenopathy  External genitalia:   Normal appearing vulva with no masses, tenderness, or lesions  BUS/Urethra/Skene's glands:  Normal  Vagina:  Normal appearing with normal color and discharge, no lesions  Cervix:  Normal appearing without discharge or lesions  Uterus:  Normal in size, shape and contour.  Midline and mobile, nontender  Adnexa/parametria:     Rt: Normal in size, without masses or tenderness.   Lt: Normal in size, without masses or tenderness.  Anus and perineum: Normal  Digital rectal exam: Deferred  Patient informed chaperone available to be present for breast and pelvic exam. Patient has requested no chaperone to be present. Patient has been advised what will be completed during breast and pelvic exam.   Assessment/Plan:  57 y.o. [id number] for annual exam.   Well female exam with routine gynecological exam - Education provided on SBEs, importance of preventative screenings, current guidelines, high calcium diet, regular exercise, and multivitamin daily. Labs with PCP.   Postmenopausal - no HRT, no bleeding  Osteopenia of multiple sites - T-score -1.7 without elevated FRAX. Continue Vit D, high calcium diet and regular exercise.   Screening for cervical cancer - Plan: Cytology - PAP( [facility]). 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 3-year interval recommended due to HIV status. Requests pap today.   Screening for breast cancer - Normal mammogram history.  Continue annual screenings.  Normal breast exam today.  Screening for colon cancer - 2019 colonoscopy. Will repeat at GI's recommended interval.   Follow up in 1 year for annual.      Olivia Mackie [place], 2:01 PM 11/22/2022

## 2022-11-26 ENCOUNTER — Other Ambulatory Visit (HOSPITAL_COMMUNITY): Payer: Self-pay

## 2022-11-27 ENCOUNTER — Other Ambulatory Visit: Payer: Self-pay

## 2022-11-27 ENCOUNTER — Other Ambulatory Visit (HOSPITAL_COMMUNITY): Payer: Self-pay

## 2022-11-30 LAB — CYTOLOGY - PAP
Comment: NEGATIVE
Diagnosis: UNDETERMINED — AB
High risk HPV: NEGATIVE

## 2022-12-28 ENCOUNTER — Other Ambulatory Visit: Payer: Self-pay

## 2022-12-28 ENCOUNTER — Other Ambulatory Visit (HOSPITAL_COMMUNITY): Payer: Self-pay

## 2023-01-01 ENCOUNTER — Other Ambulatory Visit: Payer: Self-pay

## 2023-01-17 ENCOUNTER — Other Ambulatory Visit (HOSPITAL_COMMUNITY): Payer: Self-pay

## 2023-01-21 ENCOUNTER — Other Ambulatory Visit: Payer: Self-pay

## 2023-01-23 ENCOUNTER — Other Ambulatory Visit: Payer: Self-pay

## 2023-01-25 ENCOUNTER — Other Ambulatory Visit: Payer: Self-pay

## 2023-02-01 ENCOUNTER — Other Ambulatory Visit (HOSPITAL_COMMUNITY): Payer: Self-pay

## 2023-02-06 ENCOUNTER — Encounter: Payer: Self-pay | Admitting: Gastroenterology

## 2023-02-26 ENCOUNTER — Other Ambulatory Visit (HOSPITAL_COMMUNITY): Payer: Self-pay

## 2023-03-01 ENCOUNTER — Other Ambulatory Visit: Payer: Self-pay

## 2023-03-21 ENCOUNTER — Other Ambulatory Visit: Payer: Self-pay | Admitting: Nurse Practitioner

## 2023-03-21 DIAGNOSIS — Z1231 Encounter for screening mammogram for malignant neoplasm of breast: Secondary | ICD-10-CM

## 2023-03-27 ENCOUNTER — Other Ambulatory Visit (HOSPITAL_COMMUNITY): Payer: Self-pay

## 2023-03-30 IMAGING — MG MM DIGITAL SCREENING BILAT W/ TOMO AND CAD
8 series · 8 of 24 positions shown · non-contrast
Comparison: Previous exam(s).

CLINICAL DATA: Screening.

EXAM:
DIGITAL SCREENING BILATERAL MAMMOGRAM WITH TOMOSYNTHESIS AND CAD
TECHNIQUE: Bilateral screening digital craniocaudal and mediolateral oblique
mammograms were obtained. Bilateral screening digital breast
tomosynthesis was performed. The images were evaluated with
computer-aided detection.

[R CC synth-2D]
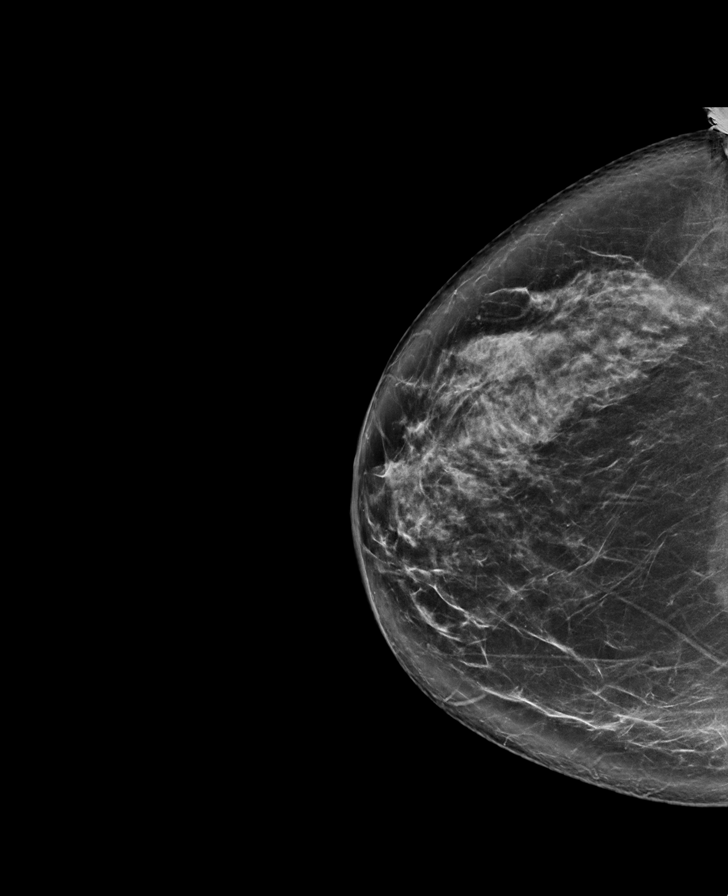

[L CC synth-2D]
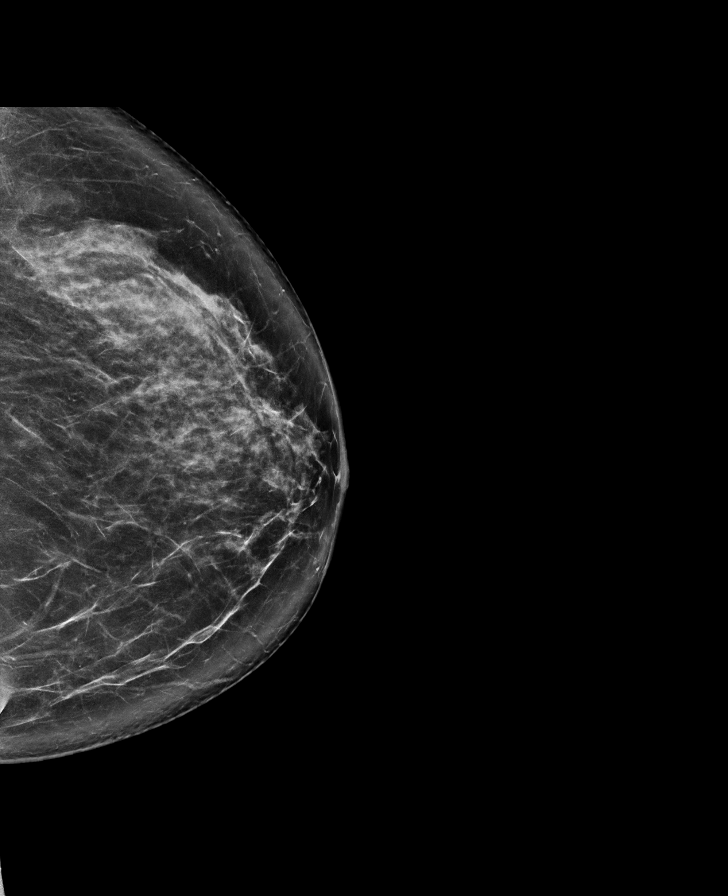

[L MLO synth-2D]
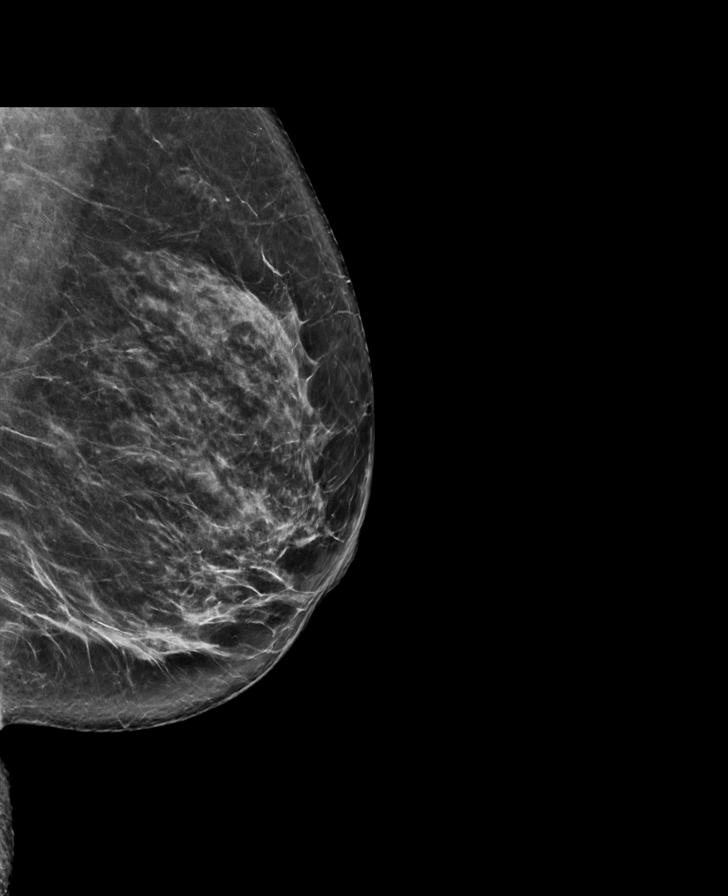

[R MLO synth-2D]
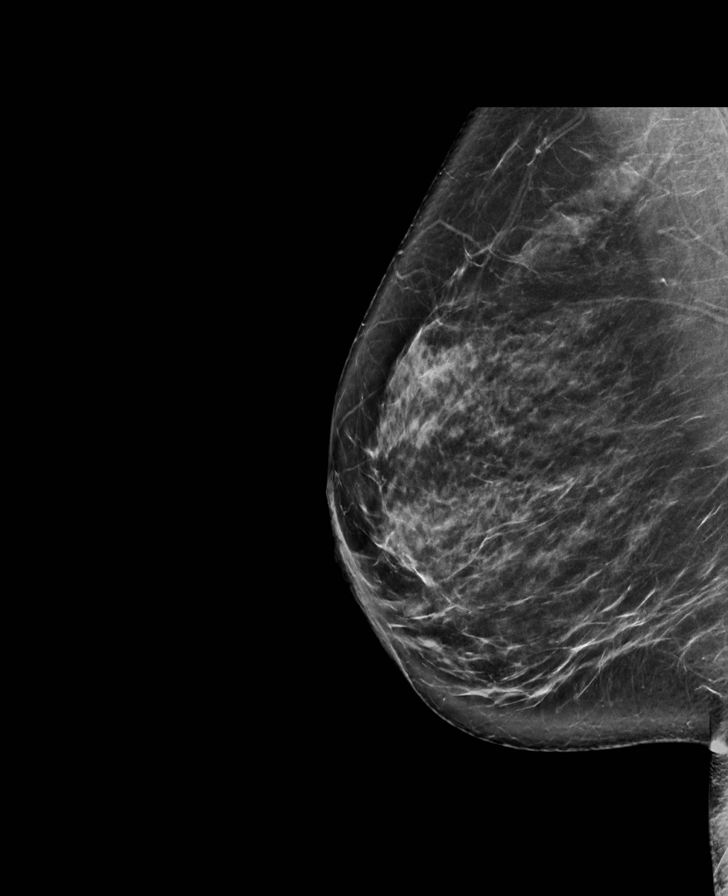

[R CC tomo · tomo slice 44/87.0]
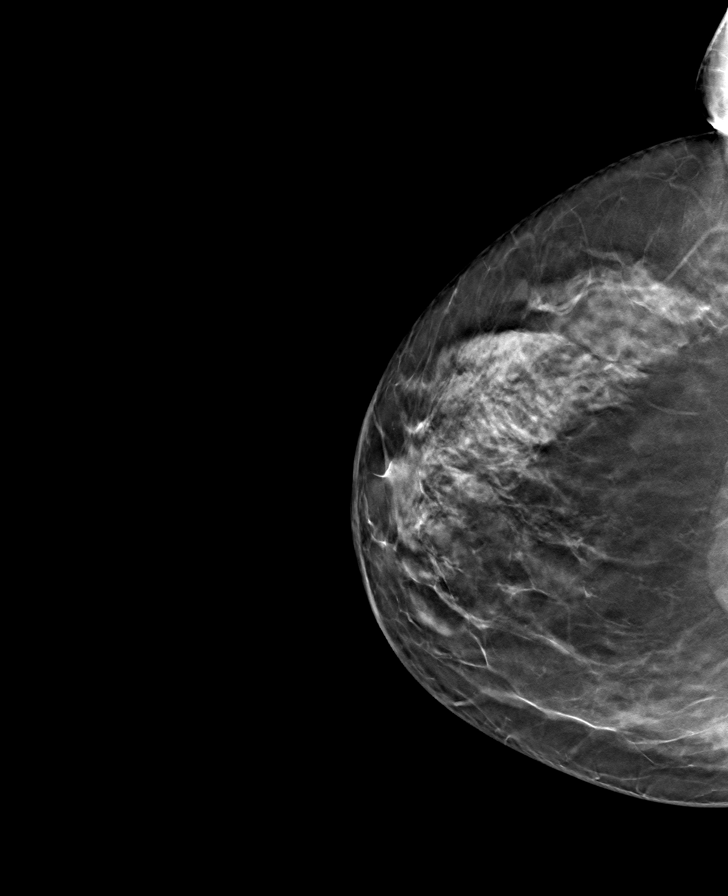

[L MLO tomo · tomo slice 42/83.0]
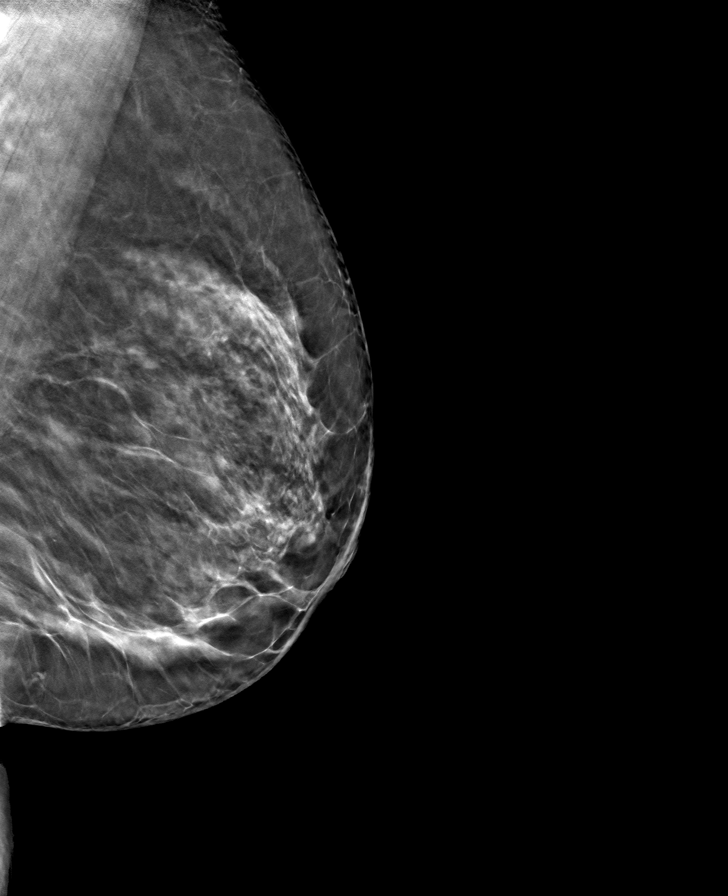

[R MLO tomo · tomo slice 45/88.0]
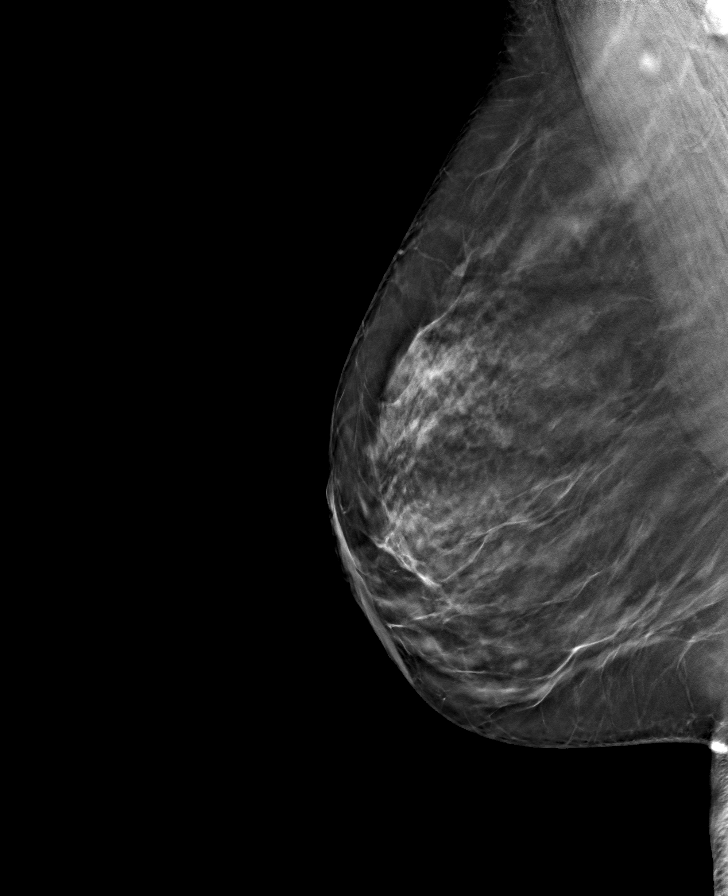

[L CC tomo · tomo slice 43/86.0]
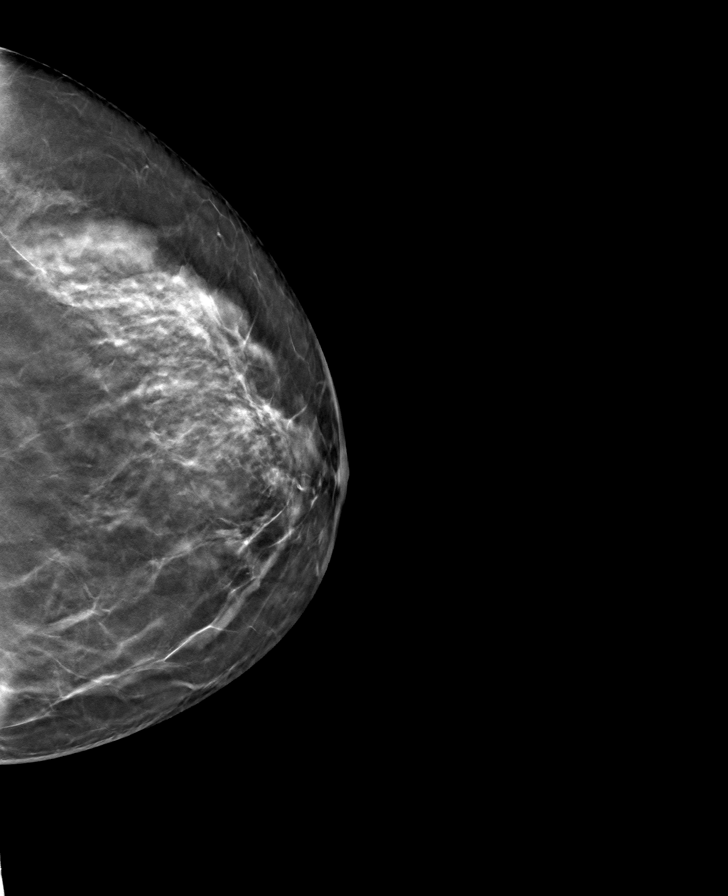

[8 of 24 positions shown; findings below may reference images not displayed]

ACR Breast Density Category c: The breast tissue is heterogeneously
dense, which may obscure small masses.
FINDINGS: There are no findings suspicious for malignancy.
IMPRESSION: No mammographic evidence of malignancy. A result letter of this
screening mammogram will be mailed directly to the patient.

RECOMMENDATION:
Screening mammogram in one year. (Code:[id number])

BI-RADS CATEGORY  1: Negative.

## 2023-04-01 ENCOUNTER — Other Ambulatory Visit (HOSPITAL_COMMUNITY): Payer: Self-pay

## 2023-04-11 ENCOUNTER — Ambulatory Visit
Admission: RE | Admit: 2023-04-11 | Discharge: 2023-04-11 | Disposition: A | Discharge status: Home / Self Care | Payer: Commercial Managed Care - PPO | Source: Ambulatory Visit

## 2023-04-11 DIAGNOSIS — Z1231 Encounter for screening mammogram for malignant neoplasm of breast: Secondary | ICD-10-CM | POA: Diagnosis not present

## 2023-04-16 ENCOUNTER — Other Ambulatory Visit: Payer: Self-pay

## 2023-04-16 ENCOUNTER — Other Ambulatory Visit (HOSPITAL_COMMUNITY): Payer: Self-pay

## 2023-04-16 ENCOUNTER — Encounter: Payer: Self-pay | Admitting: Internal Medicine

## 2023-04-16 ENCOUNTER — Ambulatory Visit: Payer: Commercial Managed Care - PPO | Admitting: Internal Medicine

## 2023-04-16 VITALS — BP 106/72 | HR 77 | Temp 97.9°F | Wt 147.2 lb

## 2023-04-16 DIAGNOSIS — N951 Menopausal and female climacteric states: Secondary | ICD-10-CM

## 2023-04-16 DIAGNOSIS — B20 Human immunodeficiency virus [HIV] disease: Secondary | ICD-10-CM

## 2023-04-16 DIAGNOSIS — Z79899 Other long term (current) drug therapy: Secondary | ICD-10-CM

## 2023-04-16 MED ORDER — DESCOVY 200-25 MG PO TABS
1.0000 | ORAL_TABLET | Freq: Every day | ORAL | 11 refills | Status: DC
  Filled 2023-04-16 – 2023-04-23 (×2): qty 30, 30d supply, fill #0
  Filled 2023-05-28: qty 30, 30d supply, fill #1
  Filled 2023-06-25: qty 30, 30d supply, fill #2
  Filled 2023-07-23: qty 30, 30d supply, fill #3
  Filled 2023-08-28 (×2): qty 30, 30d supply, fill #4
  Filled 2023-09-24: qty 30, 30d supply, fill #5
  Filled 2023-10-28: qty 30, 30d supply, fill #6
  Filled 2023-11-25: qty 30, 30d supply, fill #7
  Filled 2023-12-26: qty 30, 30d supply, fill #8
  Filled 2024-01-27: qty 30, 30d supply, fill #9
  Filled 2024-03-02 (×2): qty 30, 30d supply, fill #10
  Filled 2024-03-30: qty 30, 30d supply, fill #11

## 2023-04-16 MED ORDER — DORAVIRINE 100 MG PO TABS
100.0000 mg | ORAL_TABLET | Freq: Every day | ORAL | 11 refills | Status: DC
  Filled 2023-04-16 – 2023-04-23 (×2): qty 30, 30d supply, fill #0
  Filled 2023-05-28: qty 30, 30d supply, fill #1
  Filled 2023-06-25: qty 30, 30d supply, fill #2
  Filled 2023-07-23: qty 30, 30d supply, fill #3
  Filled 2023-08-28 (×2): qty 30, 30d supply, fill #4
  Filled 2023-09-24: qty 30, 30d supply, fill #5
  Filled 2023-10-28: qty 30, 30d supply, fill #6
  Filled 2023-11-25: qty 30, 30d supply, fill #7
  Filled 2023-12-26: qty 30, 30d supply, fill #8
  Filled 2024-01-27: qty 30, 30d supply, fill #9
  Filled 2024-03-02 (×2): qty 30, 30d supply, fill #10
  Filled 2024-03-30: qty 30, 30d supply, fill #11

## 2023-04-16 NOTE — Patient Instructions (Signed)
  Please start taking calcium 1000-1200mg  daily plus vitamin D 3 [id number] international to help prevent osteoporosis
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
## 2023-04-18 LAB — CBC WITH DIFFERENTIAL/PLATELET
Absolute Monocytes: 289 {cells}/uL (ref 200–950)
Basophils Absolute: 20 {cells}/uL (ref 0–200)
Basophils Relative: 0.6 %
Eosinophils Absolute: 139 {cells}/uL (ref 15–500)
Eosinophils Relative: 4.1 %
HCT: 35.1 % (ref 35.0–45.0)
Hemoglobin: 12.1 g/dL (ref 11.7–15.5)
Lymphs Abs: 2067 {cells}/uL (ref 850–3900)
MCH: 31.5 pg (ref 27.0–33.0)
MCHC: 34.5 g/dL (ref 32.0–36.0)
MCV: 91.4 fL (ref 80.0–100.0)
MPV: 9.7 fL (ref 7.5–12.5)
Monocytes Relative: 8.5 %
Neutro Abs: 884 {cells}/uL — ABNORMAL LOW (ref 1500–7800)
Neutrophils Relative %: 26 %
Platelets: 174 10*3/uL (ref 140–400)
RBC: 3.84 10*6/uL (ref 3.80–5.10)
RDW: 11.3 % (ref 11.0–15.0)
Total Lymphocyte: 60.8 %
WBC: 3.4 10*3/uL — ABNORMAL LOW (ref 3.8–10.8)

## 2023-04-18 LAB — COMPLETE METABOLIC PANEL WITH GFR
AG Ratio: 1.8 (calc) (ref 1.0–2.5)
ALT: 24 U/L (ref 6–29)
AST: 26 U/L (ref 10–35)
Albumin: 4.6 g/dL (ref 3.6–5.1)
Alkaline phosphatase (APISO): 52 U/L (ref 37–153)
BUN/Creatinine Ratio: 27 (calc) — ABNORMAL HIGH (ref 6–22)
BUN: 26 mg/dL — ABNORMAL HIGH (ref 7–25)
CO2: 25 mmol/L (ref 20–32)
Calcium: 9.7 mg/dL (ref 8.6–10.4)
Chloride: 106 mmol/L (ref 98–110)
Creat: 0.97 mg/dL (ref 0.50–1.03)
Globulin: 2.5 g/dL (ref 1.9–3.7)
Glucose, Bld: 98 mg/dL (ref 65–99)
Potassium: 4.4 mmol/L (ref 3.5–5.3)
Sodium: 139 mmol/L (ref 135–146)
Total Bilirubin: 0.3 mg/dL (ref 0.2–1.2)
Total Protein: 7.1 g/dL (ref 6.1–8.1)
eGFR: 69 mL/min/{1.73_m2} (ref >=60)

## 2023-04-18 LAB — T-HELPER CELL (CD4) - (RCID CLINIC ONLY)
CD4 % Helper T Cell: 37 % (ref 33–65)
CD4 T Cell Abs: 728 /uL (ref 400–1790)

## 2023-04-18 LAB — HIV-1 RNA QUANT-NO REFLEX-BLD
HIV 1 RNA Quant: NOT DETECTED {copies}/mL
HIV-1 RNA Quant, Log: NOT DETECTED {Log_copies}/mL

## 2023-04-18 LAB — RPR: RPR Ser Ql: NONREACTIVE

## 2023-04-23 ENCOUNTER — Other Ambulatory Visit (HOSPITAL_COMMUNITY): Payer: Self-pay

## 2023-05-02 ENCOUNTER — Other Ambulatory Visit: Payer: Self-pay

## 2023-05-28 ENCOUNTER — Other Ambulatory Visit: Payer: Self-pay

## 2023-05-28 NOTE — Progress Notes (Signed)
Specialty Pharmacy Refill Coordination Note  Cynthia [name] is a 57 y.o. female contacted today regarding refills of specialty medication(s) Doravirine; Emtricitabine-Tenofovir Af   Patient requested [name] at [facility] at [place] date: 06/04/23   Medication will be filled on 06/03/23.

## 2023-06-06 ENCOUNTER — Other Ambulatory Visit (HOSPITAL_COMMUNITY): Payer: Self-pay

## 2023-06-11 DIAGNOSIS — L814 Other melanin hyperpigmentation: Secondary | ICD-10-CM | POA: Diagnosis not present

## 2023-06-11 DIAGNOSIS — D225 Melanocytic nevi of trunk: Secondary | ICD-10-CM | POA: Diagnosis not present

## 2023-06-11 DIAGNOSIS — L821 Other seborrheic keratosis: Secondary | ICD-10-CM | POA: Diagnosis not present

## 2023-06-11 DIAGNOSIS — L578 Other skin changes due to chronic exposure to nonionizing radiation: Secondary | ICD-10-CM | POA: Diagnosis not present

## 2023-06-25 ENCOUNTER — Other Ambulatory Visit: Payer: Self-pay

## 2023-06-25 NOTE — Progress Notes (Signed)
Specialty Pharmacy Refill Coordination Note  Cynthia [name] is a 57 y.o. female contacted today regarding refills of specialty medication(s) Doravirine; Emtricitabine-Tenofovir Af   Patient requested [name] at [facility] at [place] date: 07/03/23   Medication will be filled on 07/02/23.

## 2023-07-02 ENCOUNTER — Other Ambulatory Visit: Payer: Self-pay

## 2023-07-07 ENCOUNTER — Other Ambulatory Visit: Payer: Self-pay | Admitting: Nurse Practitioner

## 2023-07-07 DIAGNOSIS — E785 Hyperlipidemia, unspecified: Secondary | ICD-10-CM

## 2023-07-07 DIAGNOSIS — I10 Essential (primary) hypertension: Secondary | ICD-10-CM

## 2023-07-07 DIAGNOSIS — Z0000 Encounter for general adult medical examination without abnormal findings: Secondary | ICD-10-CM

## 2023-07-08 ENCOUNTER — Other Ambulatory Visit (HOSPITAL_COMMUNITY): Payer: Self-pay

## 2023-07-08 MED ORDER — LISINOPRIL 10 MG PO TABS
10.0000 mg | ORAL_TABLET | Freq: Every day | ORAL | 0 refills | Status: DC
  Filled 2023-07-16: qty 90, 90d supply, fill #0

## 2023-07-11 ENCOUNTER — Encounter: Payer: Self-pay | Admitting: Nurse Practitioner

## 2023-07-16 ENCOUNTER — Other Ambulatory Visit (HOSPITAL_COMMUNITY): Payer: Self-pay

## 2023-07-16 ENCOUNTER — Other Ambulatory Visit: Payer: Self-pay

## 2023-07-17 ENCOUNTER — Other Ambulatory Visit: Payer: Self-pay

## 2023-07-17 ENCOUNTER — Other Ambulatory Visit (HOSPITAL_COMMUNITY): Payer: Self-pay

## 2023-07-23 ENCOUNTER — Other Ambulatory Visit (HOSPITAL_COMMUNITY): Payer: Self-pay

## 2023-07-23 ENCOUNTER — Other Ambulatory Visit: Payer: Self-pay | Admitting: Nurse Practitioner

## 2023-07-23 DIAGNOSIS — Z0000 Encounter for general adult medical examination without abnormal findings: Secondary | ICD-10-CM

## 2023-07-23 DIAGNOSIS — E785 Hyperlipidemia, unspecified: Secondary | ICD-10-CM

## 2023-07-23 DIAGNOSIS — I10 Essential (primary) hypertension: Secondary | ICD-10-CM

## 2023-07-23 MED ORDER — ROSUVASTATIN CALCIUM 10 MG PO TABS
10.0000 mg | ORAL_TABLET | Freq: Every day | ORAL | 0 refills | Status: DC
  Filled 2023-07-23: qty 90, 90d supply, fill #0

## 2023-07-23 NOTE — Progress Notes (Signed)
Specialty Pharmacy Ongoing Clinical Assessment Note  Cynthia [name] is a 57 y.o. female who is being followed by the specialty pharmacy service for RxSp HIV   Patient's specialty medication(s) reviewed today: Doravirine (PIFELTRO); Emtricitabine-Tenofovir AF (Descovy)   Missed doses in the last 4 weeks: 0   Patient/Caregiver did not have any additional questions or concerns.   Therapeutic benefit summary: Patient is achieving benefit   Adverse events/side effects summary: No adverse events/side effects   Patient's therapy is appropriate to: Continue    Goals Addressed             This Visit's Progress    Achieve Undetectable HIV Viral Load < 20       Patient is on track. Patient will maintain adherence.  Viral load remains undetectable as of 04/16/23.         Follow up:  6 months  Servando Snare Specialty Pharmacist

## 2023-07-23 NOTE — Progress Notes (Signed)
Specialty Pharmacy Refill Coordination Note  RICHARD KEENEN is a 57 y.o. female contacted today regarding refills of specialty medication(s) Doravirine (PIFELTRO); Emtricitabine-Tenofovir AF (Descovy)   Patient requested Pickup at [facility] at [facility] date: 08/02/23   Medication will be filled on 08/01/23.

## 2023-07-26 ENCOUNTER — Other Ambulatory Visit (HOSPITAL_COMMUNITY): Payer: Self-pay

## 2023-07-30 ENCOUNTER — Encounter: Payer: Self-pay | Admitting: Nurse Practitioner

## 2023-07-30 ENCOUNTER — Other Ambulatory Visit: Payer: Self-pay

## 2023-07-30 ENCOUNTER — Ambulatory Visit (INDEPENDENT_AMBULATORY_CARE_PROVIDER_SITE_OTHER): Payer: Commercial Managed Care - PPO | Admitting: Nurse Practitioner

## 2023-07-30 ENCOUNTER — Other Ambulatory Visit (HOSPITAL_COMMUNITY): Payer: Self-pay

## 2023-07-30 VITALS — BP 106/74 | HR 76 | Ht 65.5 in | Wt 149.4 lb

## 2023-07-30 DIAGNOSIS — K219 Gastro-esophageal reflux disease without esophagitis: Secondary | ICD-10-CM

## 2023-07-30 DIAGNOSIS — R635 Abnormal weight gain: Secondary | ICD-10-CM

## 2023-07-30 DIAGNOSIS — E785 Hyperlipidemia, unspecified: Secondary | ICD-10-CM

## 2023-07-30 DIAGNOSIS — Z21 Asymptomatic human immunodeficiency virus [HIV] infection status: Secondary | ICD-10-CM | POA: Diagnosis not present

## 2023-07-30 DIAGNOSIS — H33021 Retinal detachment with multiple breaks, right eye: Secondary | ICD-10-CM

## 2023-07-30 DIAGNOSIS — E559 Vitamin D deficiency, unspecified: Secondary | ICD-10-CM

## 2023-07-30 DIAGNOSIS — N393 Stress incontinence (female) (male): Secondary | ICD-10-CM | POA: Insufficient documentation

## 2023-07-30 DIAGNOSIS — I10 Essential (primary) hypertension: Secondary | ICD-10-CM

## 2023-07-30 DIAGNOSIS — Z0000 Encounter for general adult medical examination without abnormal findings: Secondary | ICD-10-CM

## 2023-07-30 LAB — LIPID PANEL

## 2023-07-30 MED ORDER — LISINOPRIL 10 MG PO TABS
10.0000 mg | ORAL_TABLET | Freq: Every day | ORAL | 3 refills | Status: DC
  Filled 2023-07-30 – 2023-10-20 (×2): qty 90, 90d supply, fill #0
  Filled 2024-01-29: qty 90, 90d supply, fill #1
  Filled 2024-05-13: qty 90, 90d supply, fill #2

## 2023-07-30 MED ORDER — VITAMIN D 50 MCG (2000 UT) PO TABS
2000.0000 [IU] | ORAL_TABLET | Freq: Every day | ORAL | 3 refills | Status: DC
  Filled 2023-07-30: qty 90, 90d supply, fill #0

## 2023-07-30 MED ORDER — ROSUVASTATIN CALCIUM 10 MG PO TABS
10.0000 mg | ORAL_TABLET | Freq: Every day | ORAL | 3 refills | Status: DC
  Filled 2023-07-30 – 2023-10-20 (×2): qty 90, 90d supply, fill #0
  Filled 2024-01-29: qty 90, 90d supply, fill #1
  Filled 2024-05-13: qty 90, 90d supply, fill #2

## 2023-07-30 NOTE — Patient Instructions (Addendum)
If your labs don't show any obvious concerns for cause of weight gain we can certainly try using phentermine to help with weight loss.   You can get your shingles vaccine when you are ready. This is a two vaccine series 2-6 months apart. We can do this here or you can have this done at the pharmacy at your convenience.    WEIGHT LOSS PLANNING Your progress today shows:     07/30/2023    8:47 AM 04/16/2023    3:00 PM 11/22/2022    1:54 PM  Vitals with BMI  Height 5' 5.5"  5\' 5"   Weight 149 lbs 6 oz 147 lbs 3 oz 143 lbs  BMI 24.47  23.8  Systolic 106 106 725  Diastolic 74 72 72  Pulse 76 77 86    For best management of weight, it is vital to balance intake versus output. This means the number of calories burned per day must be less than the calories you take in with food and drink.   I recommend trying to follow a diet with the following: Calories: 1200-1500 calories per day Carbohydrates: 150-180 grams of carbohydrates per day  Why: Gives your body enough "quick fuel" for cells to maintain normal function without sending them into starvation mode.  Protein: At least 90 grams of protein per day- 30 grams with each meal Why: Protein takes longer and uses more energy than carbohydrates to break down for fuel. The carbohydrates in your meals serves as quick energy sources and proteins help use some of that extra quick energy to break down to produce long term energy. This helps you not feel hungry as quickly and protein breakdown burns calories.  Water: Drink AT LEAST 64 ounces of water per day  Why: Water is essential to healthy metabolism. Water helps to fill the stomach and keep you fuller longer. Water is required for healthy digestion and filtering of waste in the body.  Fat: Limit fats in your diet- when choosing fats, choose foods with lower fats content such as lean meats (chicken, fish, [place]).  Why: Increased fat intake leads to storage "for later". Once you burn your carbohydrate  energy, your body goes into fat and protein breakdown mode to help you loose weight.  Cholesterol: Fats and oils that are LIQUID at room temperature are best. Choose vegetable oils (olive oil, avocado oil, nuts). Avoid fats that are SOLID at room temperature (animal fats, processed meats). Healthy fats are often found in whole grains, beans, nuts, seeds, and berries.  Why: Elevated cholesterol levels lead to build up of cholesterol on the inside of your blood vessels. This will eventually cause the blood vessels to become hard and can lead to high blood pressure and damage to your organs. When the blood flow is reduced, but the pressure is high from cholesterol buildup, parts of the cholesterol can break off and form clots that can go to the brain or heart leading to a stroke or heart attack.  Fiber: Increase amount of SOLUBLE the fiber in your diet. This helps to fill you up, lowers cholesterol, and helps with digestion. Some foods high in soluble fiber are oats, peas, beans, apples, carrots, barley, and citrus fruits.   Why: Fiber fills you up, helps remove excess cholesterol, and aids in healthy digestion which are all very important in weight management.   I recommend the following as a minimum activity routine: Purposeful walk or other physical activity at least 20 minutes every single day. This  means purposefully taking a walk, jog, bike, swim, treadmill, elliptical, dance, etc.  This activity should be ABOVE your normal daily activities, such as walking at work. Goal exercise should be at least 150 minutes a week- work your way up to this.   Heart Rate: Your maximum exercise heart rate should be 220 - Your Age in Years. When exercising, get your heart rate up, but avoid going over the maximum targeted heart rate.  60-70% of your maximum heart rate is where you tend to burn the most fat. To find this number:  220 - Age In Years= Max HR  Max HR x 0.6 (or 0.7) = Fat Burning HR The Fat Burning  HR is your goal heart rate while working out to burn the most fat.  NEVER exercise to the point your feel lightheaded, weak, nauseated, dizzy. If you experience ANY of these symptoms- STOP exercise! Allow yourself to cool down and your heart rate to come down. Then restart slower next time.  If at ANY TIME you feel chest pain or chest pressure during exercise, STOP IMMEDIATELY and seek medical attention.

## 2023-07-30 NOTE — Progress Notes (Signed)
Cynthia Clamp, DNP, AGNP-c [facility] [address] [place], [place] [address] Main Office [phone]  BP 106/74   Pulse 76   Ht 5' 5.5" (1.664 m)   Wt 149 lb 6.4 oz (67.8 kg)   LMP 05/07/2019   SpO2 98%   BMI 24.48 kg/m    Subjective:    Patient ID: Cynthia [name], female    DOB: Feb 09, 1966, 58 y.o.   MRN: [id number]  HPI: Cynthia [name] is a 57 y.o. female presenting on 07/30/2023 for comprehensive medical examination.   Cynthia [name] presents for an annual physical with a primary concern of weight gain, which she attributes to menopause. She expresses a desire to return to a weight 20 pounds lighter but has not had success with current efforts. She reports no significant changes in her lifestyle or diet, which includes one exercise class per week and regular dog walking. She currently works as a Corporate treasurer with a demanding schedule, limiting her time for additional physical activity. She expresses concern that her current medication may be contributing to her weight gain and changes in fat distribution. She reports no significant side effects from her current medication.  Cynthia [name] has a history of migraines with aura, which occur infrequently. When these migraines occur, she manages the symptoms with Tylenol and occasionally Nurtec. She does not need medication prescription at this time.   She also reports occasional symptoms of gastroesophageal reflux disease (GERD), which she manages with Tums.  She reports occasional urinary leakage, particularly during periods of laughter or heavy physical activity. This is managed with wearing a minipad when she expects exacerbation. She has previously attended one session of pelvic floor physical therapy but found the experience uncomfortable. She does not feel medication is needed at this time but will keep this in mind if her symptoms worsen. she is open to returning to pelvic PT.   Pertinent items are noted in  HPI.  IMMUNIZATIONS:   Flu Vaccine: Flu vaccine completed elsewhere this season. Record updated. Prevnar 13: Prevnar 13 N/A for this patient Prevnar 20: Prevnar 20 N/A for this patient Pneumovax 23: Pneumovax 23 N/A for this patient Vac Shingrix: Shingrix declined, patient will complete at a later date HPV: N/A or Aged Out Tetanus: Tetanus completed in the last 10 years COVID: Declined, patient does not wish to have this vaccine RSV: No  HEALTH MAINTENANCE: Pap Smear HM Status: is up to date Mammogram HM Status: is up to date Colon Cancer Screening HM Status: is up to date Bone Density HM Status: N/A STI Testing HM Status: was declined  Lung CT HM Status: N/A  Concerns with vision, hearing, or dentition: No  Most Recent Depression Screen:     07/30/2023    8:47 AM 04/16/2023    3:04 PM 07/09/2022    9:03 AM 04/12/2022    2:52 PM 06/23/2021    1:48 PM  Depression screen PHQ 2/9  Decreased Interest 0 0 0 0 0  Down, Depressed, Hopeless 0 0 0 0 0  PHQ - 2 Score 0 0 0 0 0  Altered sleeping     1  Tired, decreased energy     1  Change in appetite     0  Feeling bad or failure about yourself      0  Trouble concentrating     0  Moving slowly or fidgety/restless     0  Suicidal thoughts     0  PHQ-9 Score  2  Difficult doing work/chores     Not difficult at all   Most Recent Anxiety Screen:     06/23/2021    1:48 PM  GAD 7 : Generalized Anxiety Score  Nervous, Anxious, on Edge 0  Control/stop worrying 1  Worry too much - different things 1  Trouble relaxing 0  Restless 0  Easily annoyed or irritable 1  Afraid - awful might happen 0  Total GAD 7 Score 3  Anxiety Difficulty Not difficult at all   Most Recent Fall Screen:    07/30/2023    8:47 AM 04/16/2023    3:04 PM 11/22/2022    1:54 PM 07/09/2022    9:03 AM 04/12/2022    2:52 PM  Fall Risk   Falls in the past year? 0 0 0 0 0  Number falls in past yr: 0 0 0 0 0  Injury with Fall? 0 0 0 0 0  Risk for fall due  to :    No Fall Risks No Fall Risks  Follow up Falls evaluation completed   Falls evaluation completed Falls evaluation completed    Past medical history, surgical history, medications, allergies, family history and social history reviewed with patient today and changes made to appropriate areas of the chart.  Past Medical History:  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HCC)    COVID-19    DISEASE, HYPERTENSIVE HEART, BENIGN, W/HF 11/07/2006   Qualifier: Diagnosis of  By: Jackolyn Confer RN, Tania Ade     GERD (gastroesophageal reflux disease)    HIV (human immunodeficiency virus infection) (HCC)    HIV infection (HCC)    Hypertension    Medications:  Current Outpatient Medications on File Prior to Visit  Medication Sig   Cetirizine HCl (ZYRTEC PO) Take by mouth.   doravirine (PIFELTRO) 100 MG TABS tablet Take 1 tablet (100 mg total) by mouth daily.   emtricitabine-tenofovir AF (DESCOVY) 200-25 MG tablet Take 1 tablet by mouth daily.   Multiple Vitamin (MULTIVITAMIN PO) Take by mouth.   No current facility-administered medications on file prior to visit.   Surgical History:  Past Surgical History:  Procedure Laterality Date   EYE SURGERY     Six surgeries   FINGER SURGERY  05/2010   "SCREWS IN AND OUT OF FINGER"   LASER VAPORIZATION OF OF CONDYLOMA     MANDIBLE SURGERY     NOSE SURGERY     Allergies:  Allergies  Allergen Reactions   Dapsone Shortness Of Breath   Amoxicillin Rash   Nitrofurantoin Nausea And Vomiting and Rash   Family History:  Family History  Problem Relation Age of Onset   Diabetes Father    Hypertension Father    Hypertension Mother    Cancer Maternal Grandmother        COLON   Heart disease Maternal Grandmother    Colon cancer Maternal Grandmother    Breast cancer Paternal Grandmother    Diabetes Paternal Grandmother    Colon cancer Cousin    Esophageal cancer Neg Hx    Stomach cancer Neg Hx    Rectal cancer Neg Hx         Objective:    BP 106/74   Pulse 76   Ht 5' 5.5" (1.664 m)   Wt 149 lb 6.4 oz (67.8 kg)   LMP 05/07/2019   SpO2 98%   BMI 24.48 kg/m   Wt Readings from Last 3 Encounters:  07/30/23  149 lb 6.4 oz (67.8 kg)  04/16/23 147 lb 3.2 oz (66.8 kg)  11/22/22 143 lb (64.9 kg)    Physical Exam Vitals and nursing note reviewed.  Constitutional:      General: She is not in acute distress.    Appearance: Normal appearance.  HENT:     Head: Normocephalic and atraumatic.     Right Ear: Hearing, tympanic membrane, ear canal and external ear normal.     Left Ear: Hearing, tympanic membrane, ear canal and external ear normal.     Nose: Nose normal.     Right Sinus: No maxillary sinus tenderness or frontal sinus tenderness.     Left Sinus: No maxillary sinus tenderness or frontal sinus tenderness.     Mouth/Throat:     Lips: Pink.     Mouth: Mucous membranes are moist.     Pharynx: Oropharynx is clear.  Eyes:     General: Lids are normal. Vision grossly intact.     Conjunctiva/sclera: Conjunctivae normal.     Funduscopic exam:    Right eye: Red reflex present.        Left eye: Red reflex present.    Visual Fields: Right eye visual fields normal and left eye visual fields normal.  Neck:     Thyroid: No thyromegaly.     Vascular: No carotid bruit.  Cardiovascular:     Rate and Rhythm: Normal rate and regular rhythm.     Chest Wall: PMI is not displaced.     Pulses: Normal pulses.          Dorsalis pedis pulses are 2+ on the right side and 2+ on the left side.       Posterior tibial pulses are 2+ on the right side and 2+ on the left side.     Heart sounds: Normal heart sounds. No murmur heard. Pulmonary:     Effort: Pulmonary effort is normal. No respiratory distress.     Breath sounds: Normal breath sounds.  Abdominal:     General: Abdomen is flat. Bowel sounds are normal. There is no distension.     Palpations: Abdomen is soft. There is no hepatomegaly, splenomegaly or mass.      Tenderness: There is no abdominal tenderness. There is no right CVA tenderness, left CVA tenderness, guarding or rebound.  Musculoskeletal:        General: Normal range of motion.     Cervical back: Full passive range of motion without pain, normal range of motion and neck supple. No tenderness.     Right lower leg: No edema.     Left lower leg: No edema.  Feet:     Left foot:     Toenail Condition: Left toenails are normal.  Lymphadenopathy:     Cervical: No cervical adenopathy.     Upper Body:     Right upper body: No supraclavicular adenopathy.     Left upper body: No supraclavicular adenopathy.  Skin:    General: Skin is warm and dry.     Capillary Refill: Capillary refill takes less than 2 seconds.     Nails: There is no clubbing.  Neurological:     General: No focal deficit present.     Mental Status: She is alert and oriented to person, place, and time.     GCS: GCS eye subscore is 4. GCS verbal subscore is 5. GCS motor subscore is 6.     Sensory: Sensation is intact.     Motor: Motor  function is intact.     Coordination: Coordination is intact.     Gait: Gait is intact.     Deep Tendon Reflexes: Reflexes are normal and symmetric.  Psychiatric:        Attention and Perception: Attention normal.        Mood and Affect: Mood normal.        Speech: Speech normal.        Behavior: Behavior normal. Behavior is cooperative.        Thought Content: Thought content normal.        Cognition and Memory: Cognition and memory normal.        Judgment: Judgment normal.      Results for orders placed or performed in visit on 04/16/23  T-helper cell (CD4)- ([facility] clinic only)   Collection Time: 04/16/23  3:27 PM  Result Value Ref Range   CD4 T Cell Abs 728 400 - 1,790 /uL   CD4 % Helper T Cell 37 33 - 65 %  CBC with Differential/Platelet   Collection Time: 04/16/23  3:41 PM  Result Value Ref Range   WBC 3.4 (L) 3.8 - 10.8 Thousand/uL   RBC 3.84 3.80 - 5.10 Million/uL    Hemoglobin 12.1 11.7 - 15.5 g/dL   HCT 40.9 81.1 - 91.4 %   MCV 91.4 80.0 - 100.0 fL   MCH 31.5 27.0 - 33.0 pg   MCHC 34.5 32.0 - 36.0 g/dL   RDW 78.2 95.6 - 21.3 %   Platelets 174 140 - 400 Thousand/uL   MPV 9.7 7.5 - 12.5 fL   Neutro Abs 884 (L) 1,500 - 7,800 cells/uL   Lymphs Abs 2,067 850 - 3,900 cells/uL   Absolute Monocytes 289 200 - 950 cells/uL   Eosinophils Absolute 139 15 - 500 cells/uL   Basophils Absolute 20 0 - 200 cells/uL   Neutrophils Relative % 26 %   Total Lymphocyte 60.8 %   Monocytes Relative 8.5 %   Eosinophils Relative 4.1 %   Basophils Relative 0.6 %  COMPLETE METABOLIC PANEL WITH GFR   Collection Time: 04/16/23  3:41 PM  Result Value Ref Range   Glucose, Bld 98 65 - 99 mg/dL   BUN 26 (H) 7 - 25 mg/dL   Creat 0.86 5.78 - 4.69 mg/dL   eGFR 69 > OR = 60 GE/XBM/8.41L2   BUN/Creatinine Ratio 27 (H) 6 - 22 (calc)   Sodium 139 135 - 146 mmol/L   Potassium 4.4 3.5 - 5.3 mmol/L   Chloride 106 98 - 110 mmol/L   CO2 25 20 - 32 mmol/L   Calcium 9.7 8.6 - 10.4 mg/dL   Total Protein 7.1 6.1 - 8.1 g/dL   Albumin 4.6 3.6 - 5.1 g/dL   Globulin 2.5 1.9 - 3.7 g/dL (calc)   AG Ratio 1.8 1.0 - 2.5 (calc)   Total Bilirubin 0.3 0.2 - 1.2 mg/dL   Alkaline phosphatase (APISO) 52 37 - 153 U/L   AST 26 10 - 35 U/L   ALT 24 6 - 29 U/L  HIV-1 RNA quant-no reflex-bld   Collection Time: 04/16/23  3:41 PM  Result Value Ref Range   HIV 1 RNA Quant Not Detected Copies/mL   HIV-1 RNA Quant, Log Not Detected Log cps/mL  RPR   Collection Time: 04/16/23  3:41 PM  Result Value Ref Range   RPR Ser Ql NON-REACTIVE NON-REACTIVE       Assessment & Plan:   Problem List Items Addressed This Visit  Retinal detachment of right eye with multiple breaks   Retinal detachment and lens replacement in the right eye, resulting in blindness in that eye. No new issues reported in the left eye. Due for retina specialist appointment. - Follow up with retina specialist       Gastroesophageal reflux disease   Intermittent GERD symptoms managed with Tums. No chest pain or palpitations reported. - Continue current management with Tums as needed      Weight gain   Persistent weight gain, likely multifactorial including menopause and possible medication side effects. BMI remains within a good range. Discussed potential contributors such as metabolic syndrome and medication-induced weight gain. Consideration of phentermine for weight loss kickstart. Discussed risks of phentermine including potential for increased heart rate and hypertension, and the need for diet and exercise changes to maintain weight loss after stopping the medication. - Order A1c and insulin levels - Consider phentermine for 3-6 months if labs are normal      Relevant Orders   Hemoglobin A1c   CBC with Differential/Platelet   Comprehensive metabolic panel   Lipid panel   VITAMIN D 25 Hydroxy (Vit-D Deficiency, Fractures)   Insulin, Free and Total   Stress incontinence   Stress urinary incontinence, particularly with laughing or physical activity. Previous pelvic floor physical therapy session felt uncomfortable. Discussed potential use of Ditropan as needed. Prefers to manage symptoms without medication at this time. - Consider Ditropan as needed if symptoms worsen - Encourage pelvic floor exercises      Asymptomatic HIV infection, CD4 >=500 (HCC)   Relevant Orders   Hemoglobin A1c   CBC with Differential/Platelet   Comprehensive metabolic panel   Lipid panel   VITAMIN D 25 Hydroxy (Vit-D Deficiency, Fractures)   Insulin, Free and Total   Hypertension   Relevant Medications   lisinopril (ZESTRIL) 10 MG tablet   rosuvastatin (CRESTOR) 10 MG tablet   Other Relevant Orders   Hemoglobin A1c   CBC with Differential/Platelet   Comprehensive metabolic panel   Lipid panel   VITAMIN D 25 Hydroxy (Vit-D Deficiency, Fractures)   Insulin, Free and Total   Encounter for annual physical exam -  Primary   Relevant Medications   lisinopril (ZESTRIL) 10 MG tablet   rosuvastatin (CRESTOR) 10 MG tablet   Other Relevant Orders   Hemoglobin A1c   CBC with Differential/Platelet   Comprehensive metabolic panel   Lipid panel   VITAMIN D 25 Hydroxy (Vit-D Deficiency, Fractures)   Insulin, Free and Total   Hyperlipidemia   Relevant Medications   lisinopril (ZESTRIL) 10 MG tablet   rosuvastatin (CRESTOR) 10 MG tablet   Other Relevant Orders   Hemoglobin A1c   CBC with Differential/Platelet   Comprehensive metabolic panel   Lipid panel   VITAMIN D 25 Hydroxy (Vit-D Deficiency, Fractures)   Insulin, Free and Total   Vitamin D deficiency   Relevant Medications   Cholecalciferol (VITAMIN D) 50 MCG (2000 UT) tablet   Other Relevant Orders   Hemoglobin A1c   CBC with Differential/Platelet   Comprehensive metabolic panel   Lipid panel   VITAMIN D 25 Hydroxy (Vit-D Deficiency, Fractures)   Insulin, Free and Total   Other Visit Diagnoses       Health care maintenance       Relevant Medications   lisinopril (ZESTRIL) 10 MG tablet   rosuvastatin (CRESTOR) 10 MG tablet         Follow up plan: Return in about 1 year (around 07/29/2024) for  CPE.  NEXT PREVENTATIVE PHYSICAL DUE IN 1 YEAR.  PATIENT COUNSELING PROVIDED FOR ALL ADULT PATIENTS: A well balanced diet low in saturated fats, cholesterol, and moderation in carbohydrates.  This can be as simple as monitoring portion sizes and cutting back on sugary beverages such as soda and juice to start with.    Daily water consumption of at least 64 ounces.  Physical activity at least 180 minutes per week.  If just starting out, start 10 minutes a day and work your way up.   This can be as simple as taking the stairs instead of the elevator and walking 2-3 laps around the office  purposefully every day.   STD protection, partner selection, and regular testing if high risk.  Limited consumption of alcoholic beverages if alcohol is  consumed. For men, I recommend no more than 14 alcoholic beverages per week, spread out throughout the week (max 2 per day). Avoid "binge" drinking or consuming large quantities of alcohol in one setting.  Please let me know if you feel you may need help with reduction or quitting alcohol consumption.   Avoidance of nicotine, if used. Please let me know if you feel you may need help with reduction or quitting nicotine use.   Daily mental health attention. This can be in the form of 5 minute daily meditation, prayer, journaling, yoga, reflection, etc.  Purposeful attention to your emotions and mental state can significantly improve your overall wellbeing  and  Health.  Please know that I am here to help you with all of your health care goals and am happy to work with you to find a solution that works best for you.  The greatest advice I have received with any changes in life are to take it one step at a time, that even means if all you can focus on is the next 60 seconds, then do that and celebrate your victories.  With any changes in life, you will have set backs, and that is OK. The important thing to remember is, if you have a set back, it is not a failure, it is an opportunity to try again! Screening Testing Mammogram Every 1 -2 years based on history and risk factors Starting at age 83 Pap Smear Ages 21-39 every 3 years Ages 66-65 every 5 years with HPV testing More frequent testing may be required based on results and history Colon Cancer Screening Every 1-10 years based on test performed, risk factors, and history Starting at age 16 Bone Density Screening Every 2-10 years based on history Starting at age 35 for women Recommendations for men differ based on medication usage, history, and risk factors AAA Screening One time ultrasound Men 31-21 years old who have every smoked Lung Cancer Screening Low Dose Lung CT every 12 months Age 43-80 years with a 30 pack-year smoking  history who still smoke or who have quit within the last 15 years   Screening Labs Routine  Labs: Complete Blood Count (CBC), Complete Metabolic Panel (CMP), Cholesterol (Lipid Panel) Every 6-12 months based on history and medications May be recommended more frequently based on current conditions or previous results Hemoglobin A1c Lab Every 3-12 months based on history and previous results Starting at age 72 or earlier with diagnosis of diabetes, high cholesterol, BMI >26, and/or risk factors Frequent monitoring for patients with diabetes to ensure blood sugar control Thyroid Panel (TSH) Every 6 months based on history, symptoms, and risk factors May be repeated more often if on medication  HIV One time testing for all patients 13 and older May be repeated more frequently for patients with increased risk factors or exposure Hepatitis C One time testing for all patients 11 and older May be repeated more frequently for patients with increased risk factors or exposure Gonorrhea, Chlamydia Every 12 months for all sexually active persons 13-24 years Additional monitoring may be recommended for those who are considered high risk or who have symptoms Every 12 months for any woman on birth control, regardless of sexual activity PSA Men 57-31 years old with risk factors Additional screening may be recommended from age 101-69 based on risk factors, symptoms, and history  Vaccine Recommendations Tetanus Booster All adults every 10 years Flu Vaccine All patients 6 months and older every year COVID Vaccine All patients 12 years and older Initial dosing with booster May recommend additional booster based on age and health history HPV Vaccine 2 doses all patients age 57-26 Dosing may be considered for patients over 26 Shingles Vaccine (Shingrix) 2 doses all adults 55 years and older Pneumonia (Pneumovax 65) All adults 65 years and older May recommend earlier dosing based on health  history One year apart from Prevnar 40 Pneumonia (Prevnar 73) All adults 65 years and older Dosed 1 year after Pneumovax 23 Pneumonia (Prevnar 20) One time alternative to the two dosing of 13 and 23 For all adults with initial dose of 23, 20 is recommended 1 year later For all adults with initial dose of 13, 23 is still recommended as second option 1 year later

## 2023-07-30 NOTE — Assessment & Plan Note (Signed)
Stress urinary incontinence, particularly with laughing or physical activity. Previous pelvic floor physical therapy session felt uncomfortable. Discussed potential use of Ditropan as needed. Prefers to manage symptoms without medication at this time. - Consider Ditropan as needed if symptoms worsen - Encourage pelvic floor exercises

## 2023-07-30 NOTE — Assessment & Plan Note (Signed)
Intermittent GERD symptoms managed with Tums. No chest pain or palpitations reported. - Continue current management with Tums as needed

## 2023-07-30 NOTE — Assessment & Plan Note (Signed)
Persistent weight gain, likely multifactorial including menopause and possible medication side effects. BMI remains within a good range. Discussed potential contributors such as metabolic syndrome and medication-induced weight gain. Consideration of phentermine for weight loss kickstart. Discussed risks of phentermine including potential for increased heart rate and hypertension, and the need for diet and exercise changes to maintain weight loss after stopping the medication. - Order A1c and insulin levels - Consider phentermine for 3-6 months if labs are normal

## 2023-07-30 NOTE — Assessment & Plan Note (Signed)
Retinal detachment and lens replacement in the right eye, resulting in blindness in that eye. No new issues reported in the left eye. Due for retina specialist appointment. - Follow up with retina specialist

## 2023-08-02 ENCOUNTER — Other Ambulatory Visit (HOSPITAL_COMMUNITY): Payer: Self-pay

## 2023-08-12 LAB — COMPREHENSIVE METABOLIC PANEL
ALT: 17 [IU]/L (ref 0–32)
AST: 19 [IU]/L (ref 0–40)
Albumin: 4.3 g/dL (ref 3.8–4.9)
Alkaline Phosphatase: 59 [IU]/L (ref 44–121)
BUN/Creatinine Ratio: 19 (ref 9–23)
BUN: 20 mg/dL (ref 6–24)
Bilirubin Total: 0.3 mg/dL (ref 0.0–1.2)
CO2: 22 mmol/L (ref 20–29)
Calcium: 9.8 mg/dL (ref 8.7–10.2)
Chloride: 106 mmol/L (ref 96–106)
Creatinine, Ser: 1.03 mg/dL — ABNORMAL HIGH (ref 0.57–1.00)
Globulin, Total: 2.7 g/dL (ref 1.5–4.5)
Glucose: 99 mg/dL (ref 70–99)
Potassium: 4.9 mmol/L (ref 3.5–5.2)
Sodium: 141 mmol/L (ref 134–144)
Total Protein: 7 g/dL (ref 6.0–8.5)
eGFR: 64 mL/min/{1.73_m2} (ref >59)

## 2023-08-12 LAB — HEMOGLOBIN A1C
Est. average glucose Bld gHb Est-mCnc: 114 mg/dL
Hgb A1c MFr Bld: 5.6 % (ref 4.8–5.6)

## 2023-08-12 LAB — CBC WITH DIFFERENTIAL/PLATELET
Basophils Absolute: 0 10*3/uL (ref 0.0–0.2)
Basos: 1 %
EOS (ABSOLUTE): 0.1 10*3/uL (ref 0.0–0.4)
Eos: 2 %
Hematocrit: 35.7 % (ref 34.0–46.6)
Hemoglobin: 12.1 g/dL (ref 11.1–15.9)
Immature Grans (Abs): 0 10*3/uL (ref 0.0–0.1)
Immature Granulocytes: 0 %
Lymphocytes Absolute: 1.8 10*3/uL (ref 0.7–3.1)
Lymphs: 46 %
MCH: 31.2 pg (ref 26.6–33.0)
MCHC: 33.9 g/dL (ref 31.5–35.7)
MCV: 92 fL (ref 79–97)
Monocytes Absolute: 0.3 10*3/uL (ref 0.1–0.9)
Monocytes: 7 %
Neutrophils Absolute: 1.8 10*3/uL (ref 1.4–7.0)
Neutrophils: 44 %
Platelets: 179 10*3/uL (ref 150–450)
RBC: 3.88 x10E6/uL (ref 3.77–5.28)
RDW: 12.7 % (ref 11.7–15.4)
WBC: 4 10*3/uL (ref 3.4–10.8)

## 2023-08-12 LAB — LIPID PANEL
Chol/HDL Ratio: 3.1 {ratio} (ref 0.0–4.4)
Cholesterol, Total: 156 mg/dL (ref 100–199)
HDL: 50 mg/dL (ref >39)
LDL Chol Calc (NIH): 91 mg/dL (ref 0–99)
Triglycerides: 80 mg/dL (ref 0–149)
VLDL Cholesterol Cal: 15 mg/dL (ref 5–40)

## 2023-08-12 LAB — INSULIN, FREE AND TOTAL
Free Insulin: 9.1 uU/mL
Total Insulin: 9.1 uU/mL

## 2023-08-12 LAB — VITAMIN D 25 HYDROXY (VIT D DEFICIENCY, FRACTURES): Vit D, 25-Hydroxy: 52.7 ng/mL (ref 30.0–100.0)

## 2023-08-28 ENCOUNTER — Other Ambulatory Visit: Payer: Self-pay

## 2023-08-28 NOTE — Progress Notes (Signed)
Specialty Pharmacy Refill Coordination Note  Cynthia [name] is a 58 y.o. female contacted today regarding refills of specialty medication(s) Doravirine (PIFELTRO); Emtricitabine-Tenofovir AF (Descovy)   Patient requested Pickup at [facility] at [facility] date: 09/04/23   Medication will be filled on 01.28.25.

## 2023-09-03 ENCOUNTER — Telehealth: Payer: Self-pay

## 2023-09-03 ENCOUNTER — Other Ambulatory Visit (HOSPITAL_COMMUNITY): Payer: Self-pay

## 2023-09-03 ENCOUNTER — Other Ambulatory Visit: Payer: Self-pay

## 2023-09-03 NOTE — Telephone Encounter (Signed)
[facility] Patient Advocate Encounter   Was successful in obtaining a [employer] copay card for [employer].  This copay card will make the patients copay 0.00.  I have spoken with the patient.    The billing information is as follows and has been shared with [facility].  RxBIN: [id number] RxPCN: [id number] RxGrp: [id number] ISSUER: [phone]) ID: [id number] Expiration Date: 08/05/2024   Clearance Coots, CPhT Specialty Pharmacy Patient Advocate [facility] Phone: [phone] Fax:  [phone]

## 2023-09-24 ENCOUNTER — Other Ambulatory Visit (HOSPITAL_COMMUNITY): Payer: Self-pay

## 2023-09-24 NOTE — Progress Notes (Signed)
 Specialty Pharmacy Refill Coordination Note  Cynthia [name] is a 58 y.o. female contacted today regarding refills of specialty medication(s) Doravirine (PIFELTRO); Emtricitabine-Tenofovir AF (Descovy)   Patient requested Pickup at [facility] at [facility] date: 10/03/23   Medication will be filled on 10/02/23.

## 2023-10-02 ENCOUNTER — Other Ambulatory Visit: Payer: Self-pay

## 2023-10-18 ENCOUNTER — Other Ambulatory Visit: Payer: Self-pay

## 2023-10-20 ENCOUNTER — Other Ambulatory Visit (HOSPITAL_COMMUNITY): Payer: Self-pay

## 2023-10-21 ENCOUNTER — Other Ambulatory Visit: Payer: Self-pay

## 2023-10-25 ENCOUNTER — Other Ambulatory Visit: Payer: Self-pay

## 2023-10-28 ENCOUNTER — Other Ambulatory Visit: Payer: Self-pay

## 2023-10-28 ENCOUNTER — Other Ambulatory Visit: Payer: Self-pay | Admitting: Pharmacy Technician

## 2023-10-28 NOTE — Progress Notes (Signed)
 Specialty Pharmacy Refill Coordination Note  Cynthia [name] is a 58 y.o. female contacted today regarding refills of specialty medication(s) Doravirine (PIFELTRO); Emtricitabine-Tenofovir AF (Descovy)   Patient requested Pickup at [facility] at [facility] date: 10/30/23   Medication will be filled on 10/29/23.

## 2023-10-29 ENCOUNTER — Other Ambulatory Visit: Payer: Self-pay

## 2023-11-25 ENCOUNTER — Other Ambulatory Visit: Payer: Self-pay

## 2023-11-25 NOTE — Progress Notes (Signed)
 Specialty Pharmacy Refill Coordination Note  Cynthia [name] is a 58 y.o. female contacted today regarding refills of specialty medication(s) Doravirine  (PIFELTRO ); Emtricitabine -Tenofovir  AF (Descovy )   Patient requested Pickup at [facility] at [place] date: 12/04/23   Medication will be filled on 12/03/23.

## 2023-12-03 ENCOUNTER — Other Ambulatory Visit: Payer: Self-pay

## 2023-12-05 ENCOUNTER — Telehealth: Admitting: Physician Assistant

## 2023-12-05 DIAGNOSIS — S81859A Open bite, unspecified lower leg, initial encounter: Secondary | ICD-10-CM | POA: Diagnosis not present

## 2023-12-05 DIAGNOSIS — L089 Local infection of the skin and subcutaneous tissue, unspecified: Secondary | ICD-10-CM

## 2023-12-05 MED ORDER — DOXYCYCLINE HYCLATE 100 MG PO TABS
100.0000 mg | ORAL_TABLET | Freq: Two times a day (BID) | ORAL | 0 refills | Status: DC
  Filled 2023-12-05: qty 20, 10d supply, fill #0

## 2023-12-05 NOTE — Progress Notes (Signed)
 I have spent 5 minutes in review of e-visit questionnaire, review and updating patient chart, medical decision making and response to patient.   Piedad Climes, PA-C

## 2023-12-05 NOTE — Progress Notes (Signed)
 E-Visit for Tick Bite  Thank you for describing your tick bite, Here is how we plan to help! Based on the information that you shared with me it looks like you have A tick that bite that we will treat with a short course of doxycycline .  In most cases a tick bite is painless and does not itch.  Most tick bites in which the tick is quickly removed do not require prescriptions. Ticks can transmit several diseases if they are infected and remain attacked to your skin. Therefore the length that the tick was attached and any symptoms you have experienced after the bite are import to accurately develop your custom treatment plan. In most cases a single dose of doxycycline  may prevent the development of a more serious condition.  Based on your information I have Provided a home care guide for tick bites and  instructions on when to call for help. I have sent in a course of Doxycycline  100 mg to take twice daily for 10 days for skin infection from the tick bite.   Which ticks  are associated with illness?  The Wood Tick (dog tick) is the size of a watermelon seed and can sometimes transmit [facility] spotted fever and [place]  tick fever.   The Deer Tick (black-legged tick) is between the size of a poppy seed (pin head) and an apple seed, and can sometimes transmit Lyme disease.  A brown to black tick with a white splotch on its back is likely a female Amblyomma americanum ([place] tick). This tick has been associated with Southern Tick Associated illness ( STARI)  Lyme disease has become the most common tick-borne illness in the [place] . The risk of Lyme disease following a recognized deer tick bite is estimated to be 1%.  The majority of cases of Lyme disease start with a bull's eye rash at the site of the tick bite. The rash can occur days to weeks (typically 7-10 days) after a tick bite. Treatment with antibiotics is indicated if this rash appears. Flu-like symptoms may accompany the  rash, including: fever, chills, headaches, muscle aches, and fatigue. Removing ticks promptly may prevent tick borne disease.  What can be used to prevent Tick Bites?  Insect repellant with at leas 20% DEET. Wearing long pants with sock and shoes. Avoiding tall grass and heavily wooded areas. Checking your skin after being outdoors. Shower with a washcloth after outdoor exposures.  HOME CARE ADVICE FOR TICK BITE  Wood Tick Removal:  Use a pair of tweezers and grasp the wood tick close to the skin (on its head). Pull the wood tick straight upward without twisting or crushing it. Maintain a steady pressure until it releases its grip.   If tweezers aren't available, use fingers, a loop of thread around the jaws, or a needle between the jaws for traction.  Note: covering the tick with petroleum jelly, nail polish or rubbing alcohol doesn't work. Neither does touching the tick with a hot or cold object. Tiny Deer Tick Removal:   Needs to be scraped off with a knife blade or credit card edge. Place tick in a sealed container (e.g. glass jar, zip lock plastic bag), in case your doctor wants to see it. Tick's Head Removal:  If the wood tick's head breaks off in the skin, it must be removed. Clean the skin. Then use a sterile needle to uncover the head and lift it out or scrape it off.  If a very small piece of  the head remains, the skin will eventually slough it off. Antibiotic Ointment:  Wash the wound and your hands with soap and water after removal to prevent catching any tick disease.  Apply an over the counter antibiotic ointment (e.g. bacitracin) to the bite once. Expected Course: Tick bites normally don't itch or hurt. That's why they often go unnoticed. Call Your Doctor If:  You can't remove the tick or the tick's head Fever, a severe head ache, or rash occur in the next 2 weeks Bite begins to look infected Lyme's disease is common in your area You have not had a tetanus in the last 10  years Your current symptoms become worse    MAKE SURE YOU  Understand these instructions. Will watch your condition. Will get help right away if you are not doing well or get worse.    Thank you for choosing an e-visit.  Your e-visit answers were reviewed by a board certified advanced clinical practitioner to complete your personal care plan. Depending upon the condition, your plan could have included both over the counter or prescription medications.  Please review your pharmacy choice. Make sure the pharmacy is open so you can pick up prescription now. If there is a problem, you may contact your provider through [employer] and have the prescription routed to another pharmacy.  Your safety is important to us . If you have drug allergies check your prescription carefully.   For the next 24 hours you can use MyChart to ask questions about today's visit, request a non-urgent call back, or ask for a work or school excuse. You will get an email in the next two days asking about your experience. I hope that your e-visit has been valuable and will speed your recovery.

## 2023-12-06 ENCOUNTER — Other Ambulatory Visit (HOSPITAL_COMMUNITY): Payer: Self-pay

## 2023-12-17 NOTE — Progress Notes (Signed)
 The 10-year ASCVD risk score (Arnett DK, et al., 2019) is: 1.7%   Values used to calculate the score:     Age: 58 years     Sex: Female     Is Non-Hispanic African American: No     Diabetic: No     Tobacco smoker: No     Systolic Blood Pressure: 106 mmHg     Is BP treated: Yes     HDL Cholesterol: 50 mg/dL     Total Cholesterol: 156 mg/dL  Arlon Bergamo, BSN, RN

## 2023-12-25 ENCOUNTER — Other Ambulatory Visit: Payer: Self-pay

## 2023-12-26 ENCOUNTER — Other Ambulatory Visit (HOSPITAL_COMMUNITY): Payer: Self-pay

## 2023-12-26 ENCOUNTER — Other Ambulatory Visit (HOSPITAL_COMMUNITY): Payer: Self-pay | Admitting: Pharmacy Technician

## 2023-12-26 NOTE — Progress Notes (Signed)
 Specialty Pharmacy Refill Coordination Note  Cynthia [name] is a 58 y.o. female contacted today regarding refills of specialty medication(s) Doravirine  (PIFELTRO ); Emtricitabine -Tenofovir  AF (Descovy )   Patient requested Pickup at [facility] at [place] date: 01/06/24   Medication will be filled on 01/06/24.

## 2024-01-02 ENCOUNTER — Ambulatory Visit (INDEPENDENT_AMBULATORY_CARE_PROVIDER_SITE_OTHER): Admitting: Nurse Practitioner

## 2024-01-02 ENCOUNTER — Other Ambulatory Visit (HOSPITAL_COMMUNITY)
Admission: RE | Admit: 2024-01-02 | Discharge: 2024-01-02 | Disposition: A | Discharge status: Home / Self Care | Source: Ambulatory Visit | Attending: Nurse Practitioner | Admitting: Nurse Practitioner

## 2024-01-02 ENCOUNTER — Encounter: Payer: Self-pay | Admitting: Nurse Practitioner

## 2024-01-02 VITALS — BP 116/74 | HR 84 | Ht 64.75 in | Wt 148.0 lb

## 2024-01-02 DIAGNOSIS — Z124 Encounter for screening for malignant neoplasm of cervix: Secondary | ICD-10-CM

## 2024-01-02 DIAGNOSIS — Z01419 Encounter for gynecological examination (general) (routine) without abnormal findings: Secondary | ICD-10-CM

## 2024-01-02 DIAGNOSIS — Z7689 Persons encountering health services in other specified circumstances: Secondary | ICD-10-CM | POA: Diagnosis not present

## 2024-01-02 DIAGNOSIS — M8589 Other specified disorders of bone density and structure, multiple sites: Secondary | ICD-10-CM | POA: Diagnosis not present

## 2024-01-02 DIAGNOSIS — Z780 Asymptomatic menopausal state: Secondary | ICD-10-CM

## 2024-01-02 DIAGNOSIS — Z1331 Encounter for screening for depression: Secondary | ICD-10-CM | POA: Diagnosis not present

## 2024-01-02 NOTE — Progress Notes (Signed)
 Cynthia [name] [birth year] [id number]   History:  58 y.o. [id number] presents for annual exam. Postmenopausal - no HRT. Frustrated with her weight. Up 5 pounds from last year. Feels she eats a decently healthy diet, exercises once per week. 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 Normal mammogram history. HIV infection (2000) managed by ID, on antivirals, currently undetectable. HTN, HLD managed by PCP.   Gynecologic History No LMP recorded. Patient is postmenopausal.   Contraception/Family planning: post menopausal status Sexually active: Yes  Health Maintenance Last Pap: 11/22/2022. Results were: ASCUS neg HPV Last mammogram: 04/11/2023. Results were: Normal Last colonoscopy: 04/02/2018. Results were: Polyp. 5-year recall Last Dexa: 10/02/2022. Results were: T-score -1.7, FRAX 6.5% / 0.5%     01/02/2024   11:36 AM  Depression screen PHQ 2/9  Decreased Interest 0  Down, Depressed, Hopeless 0  PHQ - 2 Score 0     Past medical history, past surgical history, family history and social history were all reviewed and documented in the EPIC chart. 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 SAHM, home schooling.   ROS:  A ROS was performed and pertinent positives and negatives are included.  Exam:  Vitals:   01/02/24 1133  BP: 116/74  Pulse: 84  SpO2: 98%  Weight: 148 lb (67.1 kg)  Height: 5' 4.75" (1.645 m)      Body mass index is 24.82 kg/m.  General appearance:  Normal Thyroid :  Symmetrical, normal in size, without palpable masses or nodularity. Respiratory  Auscultation:  Clear without wheezing or rhonchi Cardiovascular  Auscultation:  Regular rate, without rubs, murmurs or gallops  Edema/varicosities:  Not grossly evident Abdominal  Soft,nontender, without masses, guarding or rebound.  Liver/spleen:  No organomegaly noted  Hernia:  None appreciated  Skin  Inspection:  Grossly normal Breasts: Examined lying and  sitting.   Right: Without masses, retractions, discharge or axillary adenopathy.   Left: Without masses, retractions, discharge or axillary adenopathy. Pelvic: External genitalia:  no lesions              Urethra:  normal appearing urethra with no masses, tenderness or lesions              Bartholins and Skenes: normal                 Vagina: normal appearing vagina with normal color and discharge, no lesions. Atrophic changes              Cervix: no lesions Bimanual Exam:  Uterus:  no masses or tenderness              Adnexa: no mass, fullness, tenderness              Rectovaginal: Deferred              Anus:  normal, no lesions  Patient informed chaperone available to be present for breast and pelvic exam. Patient has requested no chaperone to be present. Patient has been advised what will be completed during breast and pelvic exam.   Assessment/Plan:  58 y.o. [id number] for annual exam.   Well female exam with routine gynecological exam - Education provided on SBEs, importance of preventative screenings, current guidelines, high calcium  diet, regular exercise, and multivitamin daily. Labs with PCP.   Postmenopausal - no HRT, no bleeding  Osteopenia of multiple sites - 09/2022 T-score -1.7 without elevated FRAX. Continue Vit D, high calcium  diet and regular  exercise.   Encounter for weight management - Discussed importance of calorie deficit for weight loss, intermittent fasting, increasing exercise, high protein/low calorie diet.   Cervical cancer screening - Plan: Cytology - PAP( [place]). 1995 CIN-1 LEEP. 2024 ASCUS neg HPV. Pap today per guidelines.   Screening for breast cancer - Normal mammogram history.  Continue annual screenings.  Normal breast exam today.  Screening for colon cancer - 2019 colonoscopy. Will repeat at GI's recommended interval.   Return in about 1 year (around 01/01/2025) for Annual.     Andee [facility], 11:55 AM 01/02/2024

## 2024-01-06 ENCOUNTER — Other Ambulatory Visit: Payer: Self-pay

## 2024-01-06 ENCOUNTER — Other Ambulatory Visit (HOSPITAL_COMMUNITY): Payer: Self-pay

## 2024-01-14 LAB — CYTOLOGY - PAP
Comment: NEGATIVE
Diagnosis: NEGATIVE
High risk HPV: NEGATIVE

## 2024-01-15 ENCOUNTER — Ambulatory Visit: Payer: Self-pay | Admitting: Radiology

## 2024-01-16 DIAGNOSIS — H33021 Retinal detachment with multiple breaks, right eye: Secondary | ICD-10-CM | POA: Diagnosis not present

## 2024-01-16 DIAGNOSIS — Z21 Asymptomatic human immunodeficiency virus [HIV] infection status: Secondary | ICD-10-CM | POA: Diagnosis not present

## 2024-01-16 DIAGNOSIS — Z961 Presence of intraocular lens: Secondary | ICD-10-CM | POA: Diagnosis not present

## 2024-01-16 DIAGNOSIS — H26491 Other secondary cataract, right eye: Secondary | ICD-10-CM | POA: Diagnosis not present

## 2024-01-20 ENCOUNTER — Other Ambulatory Visit: Payer: Self-pay

## 2024-01-24 ENCOUNTER — Encounter (INDEPENDENT_AMBULATORY_CARE_PROVIDER_SITE_OTHER): Payer: Self-pay

## 2024-01-27 ENCOUNTER — Other Ambulatory Visit: Payer: Self-pay

## 2024-01-27 NOTE — Progress Notes (Signed)
 Specialty Pharmacy Refill Coordination Note  Cynthia [name] is a 58 y.o. female contacted today regarding refills of specialty medication(s) Doravirine  (PIFELTRO ); Emtricitabine -Tenofovir  AF (Descovy )   Patient requested Pickup at [facility] at [place] date: 02/04/24   Medication will be filled on 06.27.25.

## 2024-01-29 ENCOUNTER — Encounter (HOSPITAL_COMMUNITY): Payer: Self-pay

## 2024-01-29 ENCOUNTER — Other Ambulatory Visit: Payer: Self-pay

## 2024-01-29 ENCOUNTER — Other Ambulatory Visit (HOSPITAL_COMMUNITY): Payer: Self-pay

## 2024-02-28 ENCOUNTER — Other Ambulatory Visit (HOSPITAL_COMMUNITY): Payer: Self-pay

## 2024-02-29 ENCOUNTER — Encounter (INDEPENDENT_AMBULATORY_CARE_PROVIDER_SITE_OTHER): Payer: Self-pay

## 2024-03-02 ENCOUNTER — Other Ambulatory Visit: Payer: Self-pay

## 2024-03-02 NOTE — Progress Notes (Signed)
 Specialty Pharmacy Refill Coordination Note  Cynthia [name] is a 58 y.o. female contacted today regarding refills of specialty medication(s) Doravirine  (PIFELTRO ); Emtricitabine -Tenofovir  AF (Descovy )   Patient requested Pickup at [facility] at [place] date: 03/03/24   Medication will be filled on 03/02/24.

## 2024-03-09 ENCOUNTER — Other Ambulatory Visit: Payer: Self-pay | Admitting: Nurse Practitioner

## 2024-03-09 DIAGNOSIS — Z1231 Encounter for screening mammogram for malignant neoplasm of breast: Secondary | ICD-10-CM

## 2024-03-17 ENCOUNTER — Other Ambulatory Visit (HOSPITAL_COMMUNITY): Payer: Self-pay

## 2024-03-17 ENCOUNTER — Ambulatory Visit: Admitting: Medical

## 2024-03-17 VITALS — BP 110/70 | HR 80 | Wt 150.8 lb

## 2024-03-17 DIAGNOSIS — K219 Gastro-esophageal reflux disease without esophagitis: Secondary | ICD-10-CM

## 2024-03-17 MED ORDER — OMEPRAZOLE 40 MG PO CPDR
40.0000 mg | DELAYED_RELEASE_CAPSULE | Freq: Every day | ORAL | 0 refills | Status: DC
  Filled 2024-03-17: qty 30, 30d supply, fill #0

## 2024-03-17 NOTE — Patient Instructions (Signed)
 Gastroesophageal Reflux Disease, Adult   Gastroesophageal reflux disease (GERD) happens when acid from your stomach flows up into the esophagus. When acid comes in contact with the esophagus, the acid causes soreness (inflammation) in the esophagus. Over time, GERD may create small holes (ulcers) in the lining of the esophagus.  CAUSES  Increased body weight. This puts pressure on the stomach, making acid rise from the stomach into the esophagus.  Smoking. This increases acid production in the stomach.  Drinking alcohol. This causes decreased pressure in the lower esophageal sphincter (valve or ring of muscle between the esophagus and stomach), allowing acid from the stomach into the esophagus.  Late evening meals and a full stomach. This increases pressure and acid production in the stomach.  A malformed lower esophageal sphincter.  Sometimes, no cause is found.  SYMPTOMS  Burning pain in the lower part of the mid-chest behind the breastbone and in the mid-stomach area. This may occur twice a week or more often.  Trouble swallowing.  Sore throat.  Dry cough.  Asthma-like symptoms including chest tightness, shortness of breath, or wheezing.   DIAGNOSIS  Your caregiver may be able to diagnose GERD based on your symptoms. In some cases, X-rays and other tests may be done to check for complications or to check the condition of your stomach and esophagus.  TREATMENT  Medication recommended or prescribed today:  Begin Omeprazole  40mg , 1 tablet about 30-45 minutes prior to breakfast.  Use this medication for about 2 weeks daily, then as needed after that.  Begin OTC Pepto Bismol twice daily for 1- 2 weeks  Consider Pepcid/ Famotidine OTC, 1 tablet in the evening   You may use antacids such as Tums or Rolaids periodically in addition to medications above   HOME CARE INSTRUCTIONS  Change the factors that you can control. Ask your caregiver for guidance concerning weight loss, quitting  smoking, and alcohol consumption.  Avoid foods and drinks that make your symptoms worse, such as:  Caffeine  or alcoholic drinks.  Chocolate.  Peppermint or mint flavorings.  Garlic and onions.  Spicy foods.  Citrus fruits, such as oranges, lemons, or limes.  Tomato-based foods such as sauce, chili, salsa, and pizza.  Fried and fatty foods.  Avoid lying down within 2-3 hours of eating or drinking.  Eat small, frequent meals instead of large meals.  Wear loose-fitting clothing. Do not wear anything tight around your waist that causes pressure on your stomach.  Raise the head of your bed 6 to 8 inches with wood blocks to help you sleep. Extra pillows will not help.  Only take over-the-counter or prescription medicines for pain, discomfort, or fever as directed by your caregiver.  Do not take aspirin, ibuprofen, or other nonsteroidal anti-inflammatory drugs (NSAIDs).   SEEK IMMEDIATE MEDICAL CARE IF:  You have pain in your arms, neck, jaw, teeth, or back.  Your pain increases or changes in intensity or duration.  You develop nausea, vomiting, or sweating (diaphoresis).  You develop shortness of breath, or you faint.  Your vomit is green, yellow, black, or looks like coffee grounds or blood.  Your stool is red, bloody, or black.  These symptoms could be signs of other problems, such as heart disease, gastric bleeding, or esophageal bleeding. MAKE SURE YOU:  Understand these instructions.  Will watch your condition.  Will get help right away if you are not doing well or get worse.  Document Released: 05/02/2005 Document Revised: 04/04/2011 Document Reviewed: 02/09/2011 Exit[employer] Patient Information 2012  [employer].

## 2024-03-17 NOTE — Progress Notes (Signed)
 Subjective:  Cynthia [name] is a 58 y.o. female who presents for Chief Complaint  Patient presents with   Acute Visit    Acid reflux, just finished prilosec for 2 weeks. Helped some but not all the way     Here for acid/GERD or ulcer.  Has been having epigastric pain, sometimes LUQ pain.  Just finished 2 weeks of OTC Prilosec.  Has indigestion, nausea.  Pain does not radiate to back.   Pain is worse sometimes when haven't eaten in a while.   Some belching.  No SOB.  No distinct chest pain.  Has eaten a lot of tomatoes this summer, but otherwise not a lot of spicy foods.  No alcohol since this pain.   However, typically has 2 glasses of wine on a weekend.  No other aggravating or relieving factors.    No other c/o.  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HCC)    COVID-19    DISEASE, HYPERTENSIVE HEART, BENIGN, W/HF 11/07/2006   Qualifier: Diagnosis of  By: Raymond RN, Delene     GERD (gastroesophageal reflux disease)    HIV (human immunodeficiency virus infection) (HCC)    HIV infection (HCC)    Hypertension    Current Outpatient Medications on File Prior to Visit  Medication Sig Dispense Refill   Cetirizine HCl (ZYRTEC PO) Take by mouth.     Cholecalciferol  (VITAMIN D ) 50 MCG (2000 UT) tablet Take 1 tablet (2,000 Units total) by mouth daily. 90 tablet 3   doravirine  (PIFELTRO ) 100 MG TABS tablet Take 1 tablet (100 mg total) by mouth daily. 30 tablet 11   emtricitabine -tenofovir  AF (DESCOVY ) 200-25 MG tablet Take 1 tablet by mouth daily. 30 tablet 11   lisinopril  (ZESTRIL ) 10 MG tablet Take 1 tablet (10 mg total) by mouth daily. 90 tablet 3   Multiple Vitamin (MULTIVITAMIN PO) Take by mouth.     psyllium (METAMUCIL) 58.6 % packet Take 1 packet by mouth daily.     rosuvastatin  (CRESTOR ) 10 MG tablet Take 1 tablet (10 mg total) by mouth daily. 90 tablet 3   No current facility-administered medications on file prior to visit.    The following portions of the  patient's history were reviewed and updated as appropriate: allergies, current medications, past family history, past medical history, past social history, past surgical history and problem list.  ROS Otherwise as in subjective above    Objective: BP 110/70   Pulse 80   Wt 150 lb 12.8 oz (68.4 kg)   LMP 05/07/2019   SpO2 97%   BMI 25.29 kg/m   General appearance: alert, no distress, well developed, well nourished Abdomen: +bs, soft, mild epigastric tenderness, otherwise non tender, non distended, no masses, no hepatomegaly, no splenomegaly Pulses: 2+ radial pulses, 2+ pedal pulses, normal cap refill Ext: no edema   Assessment: Encounter Diagnosis  Name Primary?   Gastroesophageal reflux disease, unspecified whether esophagitis present Yes     Plan: Gastroesophageal Reflux Disease: Discussed diagnosis of GERD Discussed causes including excess body weight, tobacco use, drinking alcohol, late evening meals, anatomical problems Discussed symptoms including burning pain in the lower part of the mid-chest behind the breastbone and in the mid-stomach area, trouble swallowing, sore throat, dry cough, asthma-like symptoms including chest tightness, shortness of breath, or wheezing.   Medication recommended or prescribed today:  Begin Omeprazole  40mg , 1 tablet about 30-45 minutes prior to breakfast.  Use this medication for about 2 weeks  daily, then as needed after that.  Consider OTC Famotidine,1 tablet in the evening   Begin OTC Pepto Bismol BID x 1- 2 weeks.    You may use antacids such as Tums or Rolaids periodically in addition to medications above   HOME CARE INSTRUCTIONS  Change the factors that you can control. Ask your caregiver for guidance concerning weight loss, quitting smoking, and alcohol consumption.  Avoid foods and drinks that make your symptoms worse, such as:  Caffeine  or alcoholic drinks.  Chocolate.  Peppermint or mint flavorings.  Garlic and onions.   Spicy foods.  Citrus fruits, such as oranges, lemons, or limes.  Tomato-based foods such as sauce, chili, salsa, and pizza.  Fried and fatty foods.  Avoid lying down within 2-3 hours of eating or drinking.  Eat small, frequent meals instead of large meals.  Wear loose-fitting clothing. Do not wear anything tight around your waist that causes pressure on your stomach.  Raise the head of your bed 6 to 8 inches with wood blocks to help you sleep. Extra pillows will not help.  Only take over-the-counter or prescription medicines for pain, discomfort, or fever as directed by your caregiver.  Do not take aspirin, ibuprofen, or other nonsteroidal anti-inflammatory drugs (NSAIDs).   SEEK IMMEDIATE MEDICAL CARE IF:  You have pain in your arms, neck, jaw, teeth, or back.  Your pain increases or changes in intensity or duration.  You develop nausea, vomiting, or sweating (diaphoresis).  You develop shortness of breath, or you faint.  Your vomit is green, yellow, black, or looks like coffee grounds or blood.  Your stool is red, bloody, or black.  These symptoms could be signs of other problems, such as heart disease, gastric bleeding, or esophageal bleeding.  [name] was seen today for acute visit.  Diagnoses and all orders for this visit:  Gastroesophageal reflux disease, unspecified whether esophagitis present  Other orders -     omeprazole  (PRILOSEC) 40 MG capsule; Take 1 capsule (40 mg total) by mouth daily.    Follow up: call report 1- 2 weeks

## 2024-03-19 ENCOUNTER — Other Ambulatory Visit (HOSPITAL_COMMUNITY): Payer: Self-pay

## 2024-03-19 ENCOUNTER — Other Ambulatory Visit: Payer: Self-pay | Admitting: Medical

## 2024-03-19 MED ORDER — ONDANSETRON 4 MG PO TBDP
4.0000 mg | ORAL_TABLET | Freq: Three times a day (TID) | ORAL | 0 refills | Status: DC | PRN
  Filled 2024-03-19: qty 20, 7d supply, fill #0

## 2024-03-29 ENCOUNTER — Encounter (INDEPENDENT_AMBULATORY_CARE_PROVIDER_SITE_OTHER): Payer: Self-pay

## 2024-03-29 NOTE — Progress Notes (Unsigned)
 Ellouise Console, PA-C [address] [place], [place]  [address] Phone: [phone]   Gastroenterology Consultation  Referring Provider:     Early, Camie BRAVO, NP Primary Care Physician:  Oris Camie BRAVO, NP Primary Gastroenterologist:  Ellouise Console, PA-C / Elspeth Naval, MD  Reason for Consultation:     Upper Abdominal pain, nausea, dark stools        HPI:   NINI CAVAN is a 58 y.o. y/o female referred for consultation & management  by Early, Camie BRAVO, NP.  New patient.  Here to evaluate abdominal pain, nausea, dark stools.  Takes Prilosec 40 Mg 1 capsule daily (started 03/17/2024).  Also takes Metamucil daily and Zofran  as needed.  Current symptoms: Patient has had epigastric and LUQ upper abdominal pain for few months.  She took OTC Prilosec 20 Mg daily for 2 weeks with little benefit.  She saw her PCP who increased Prilosec to 40 Mg daily which is helping.  She reported vomiting all day 2 weeks ago.  She was taking Pepto-Bismol and noticed black stools after that.  She stopped Pepto and stools returned to brown color.  She has not seen any visible rectal bleeding.  No hematemesis.  She has history of acid reflux for many years.  Has taken OTC treatment intermittently for many years.  She has never had an EGD.  She denies NSAID use.  Occasionally drinks alcohol socially.  PMH:  HIV, CMV retinitis, hyperlipidemia, hypertension, GERD.  03/2018 colonoscopy by Dr. Naval: 3 mm tubular adenoma polyp removed from ascending colon.  A single small angiodysplastic lesion in the rectum.  Good prep.  7-year repeat (due 03/2025).  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HCC)    COVID-19    DISEASE, HYPERTENSIVE HEART, BENIGN, W/HF 11/07/2006   Qualifier: Diagnosis of  By: Raymond RN, Delene     GERD (gastroesophageal reflux disease)    HIV (human immunodeficiency virus infection) (HCC)    HIV infection (HCC)    Hypertension     Past Surgical History:   Procedure Laterality Date   EYE SURGERY     Six surgeries   FINGER SURGERY  05/2010   SCREWS IN AND OUT OF FINGER   LASER VAPORIZATION OF OF CONDYLOMA     MANDIBLE SURGERY     NOSE SURGERY      Prior to Admission medications   Medication Sig Start Date End Date Taking? Authorizing Provider  Cetirizine HCl (ZYRTEC PO) Take by mouth.    [provider]  Cholecalciferol  (VITAMIN D ) 50 MCG (2000 UT) tablet Take 1 tablet (2,000 Units total) by mouth daily. 07/30/23   Early, Sara E, NP  doravirine  (PIFELTRO ) 100 MG TABS tablet Take 1 tablet (100 mg total) by mouth daily. 04/16/23   Luiz Channel, MD  emtricitabine -tenofovir  AF (DESCOVY ) 200-25 MG tablet Take 1 tablet by mouth daily. 04/16/23   Luiz Channel, MD  lisinopril  (ZESTRIL ) 10 MG tablet Take 1 tablet (10 mg total) by mouth daily. 07/30/23   Early, Sara E, NP  Multiple Vitamin (MULTIVITAMIN PO) Take by mouth.    [provider]  omeprazole  (PRILOSEC) 40 MG capsule Take 1 capsule (40 mg total) by mouth daily. 03/17/24   Tysinger, Alm RAMAN, PA-C  ondansetron  (ZOFRAN -ODT) 4 MG disintegrating tablet Take 1 tablet (4 mg total) by mouth every 8 (eight) hours as needed for nausea or vomiting. 03/19/24   Tysinger, Alm RAMAN, PA-C  psyllium (METAMUCIL) 58.6 % packet Take 1 packet by mouth daily.    [provider]  rosuvastatin  (CRESTOR ) 10 MG tablet Take 1 tablet (10 mg total) by mouth daily. 07/30/23   Oris Camie BRAVO, NP    Family History  Problem Relation Age of Onset   Hypertension Mother    Diabetes Father    Hypertension Father    Colon polyps Father    Cancer Maternal Grandmother        COLON   Heart disease Maternal Grandmother    Colon cancer Maternal Grandmother    Breast cancer Paternal Grandmother    Diabetes Paternal Grandmother    Colon cancer Cousin    Esophageal cancer Neg Hx    Stomach cancer Neg Hx    Rectal cancer Neg Hx      Social History   Tobacco Use   Smoking status: Former    Smokeless tobacco: Never   Tobacco comments:    Quit 1996  Vaping Use   Vaping status: Never Used  Substance Use Topics   Alcohol use: Yes    Alcohol/week: 0.0 standard drinks of alcohol    Comment: socially   Drug use: No    Allergies as of 03/30/2024 - Review Complete 03/30/2024  Allergen Reaction Noted   Dapsone Shortness Of Breath 10/02/2007   Amoxicillin Rash 01/15/2012   Nitrofurantoin  Nausea And Vomiting and Rash 11/04/2021    Review of Systems:    All systems reviewed and negative except where noted in HPI.   Physical Exam:  BP 108/60   Pulse 70   Ht 5' 4.75 (1.645 m)   Wt 151 lb 6.4 oz (68.7 kg)   LMP 05/07/2019   BMI 25.39 kg/m  Patient's last menstrual period was 05/07/2019.  General:   Alert,  Well-developed, well-nourished, pleasant and cooperative in NAD Lungs:  Respirations even and unlabored.  Clear throughout to auscultation.   No wheezes, crackles, or rhonchi. No acute distress. Heart:  Regular rate and rhythm; no murmurs, clicks, rubs, or gallops. Abdomen:  Normal bowel sounds.  No bruits.  Soft, and non-distended without masses, hepatosplenomegaly or hernias noted.  Mild epigastric and LUQ Tenderness.  No guarding or rebound tenderness.    Neurologic:  Alert and oriented x3;  grossly normal neurologically. Psych:  Alert and cooperative. Normal mood and affect.  Imaging Studies: No results found.  Labs: CBC    Component Value Date/Time   WBC 4.0 07/30/2023 0947   WBC 3.4 (L) 04/16/2023 1541   RBC 3.88 07/30/2023 0947   RBC 3.84 04/16/2023 1541   HGB 12.1 07/30/2023 0947   HCT 35.7 07/30/2023 0947   PLT 179 07/30/2023 0947   MCV 92 07/30/2023 0947    CMP     Component Value Date/Time   NA 141 07/30/2023 0947   K 4.9 07/30/2023 0947   CL 106 07/30/2023 0947   CO2 22 07/30/2023 0947   GLUCOSE 99 07/30/2023 0947   GLUCOSE 98 04/16/2023 1541   BUN 20 07/30/2023 0947   CREATININE 1.03 (H) 07/30/2023 0947   CREATININE 0.97 04/16/2023  1541   CALCIUM  9.8 07/30/2023 0947   PROT 7.0 07/30/2023 0947   ALBUMIN 4.3 07/30/2023 0947   AST 19 07/30/2023 0947   ALT 17 07/30/2023 0947   ALKPHOS 59 07/30/2023 0947   BILITOT 0.3 07/30/2023 0947   GFRNONAA 43 (L) 02/13/2021 0537   GFRNONAA 46 (L) 01/26/2021 1604   GFRAA 54 (L) 01/26/2021 1604    Assessment and  Plan:   NEVILLE PAULS is a 58 y.o. y/o female has been referred for:  Epigastric Pain: Differential includes PUD, Gastritis, H. Pylori. - She is advised to avoid NSAIDS.  OK to take Tylenol . - Scheduling EGD I discussed risks of EGD with patient to include risk of bleeding, perforation, and risk of sedation.  Patient expressed understanding and agrees to proceed with EGD.   GERD - Continue Omeprazole  40mg  1 tablet daily. - Recommend Lifestyle Modifications to prevent Acid Reflux.  Rec. Avoid coffee, sodas, peppermint, garlic, onions, alcohol, citrus fruits, chocolate, tomatoes, fatty and spicey foods.  Avoid eating 2-3 hours before bedtime.    3.  Dark Stools: Most Likely due to taking Pepto Bismol.  Peptic Ulcer cannot be excluded.  Stools have returned to brown color since she stopped Pepto. - Avoid Pepto Bismol. - Avoid NSAIDS. - Continue PPI. - Schedule EGD. - Order CBC to screen for Anemia.  Follow up Based on EGD Results and GI symptoms.  Ellouise Console, PA-C

## 2024-03-30 ENCOUNTER — Other Ambulatory Visit: Payer: Self-pay

## 2024-03-30 ENCOUNTER — Ambulatory Visit: Admitting: Physician Assistant

## 2024-03-30 ENCOUNTER — Other Ambulatory Visit (INDEPENDENT_AMBULATORY_CARE_PROVIDER_SITE_OTHER)

## 2024-03-30 ENCOUNTER — Other Ambulatory Visit (HOSPITAL_COMMUNITY): Payer: Self-pay

## 2024-03-30 ENCOUNTER — Encounter: Payer: Self-pay | Admitting: Physician Assistant

## 2024-03-30 VITALS — BP 108/60 | HR 70 | Ht 64.75 in | Wt 151.4 lb

## 2024-03-30 DIAGNOSIS — R1013 Epigastric pain: Secondary | ICD-10-CM

## 2024-03-30 DIAGNOSIS — R195 Other fecal abnormalities: Secondary | ICD-10-CM

## 2024-03-30 DIAGNOSIS — K219 Gastro-esophageal reflux disease without esophagitis: Secondary | ICD-10-CM

## 2024-03-30 LAB — CBC WITH DIFFERENTIAL/PLATELET
Basophils Absolute: 0 K/uL (ref 0.0–0.1)
Basophils Relative: 0.6 % (ref 0.0–3.0)
Eosinophils Absolute: 0.4 K/uL (ref 0.0–0.7)
Eosinophils Relative: 8.7 % — ABNORMAL HIGH (ref 0.0–5.0)
HCT: 34.9 % — ABNORMAL LOW (ref 36.0–46.0)
Hemoglobin: 12.3 g/dL (ref 12.0–15.0)
Lymphocytes Relative: 40.4 % (ref 12.0–46.0)
Lymphs Abs: 1.8 K/uL (ref 0.7–4.0)
MCHC: 35.1 g/dL (ref 30.0–36.0)
MCV: 92.7 fl (ref 78.0–100.0)
Monocytes Absolute: 0.3 K/uL (ref 0.1–1.0)
Monocytes Relative: 7.2 % (ref 3.0–12.0)
Neutro Abs: 2 K/uL (ref 1.4–7.7)
Neutrophils Relative %: 43.1 % (ref 43.0–77.0)
Platelets: 196 K/uL (ref 150.0–400.0)
RBC: 3.77 Mil/uL — ABNORMAL LOW (ref 3.87–5.11)
RDW: 13.3 % (ref 11.5–15.5)
WBC: 4.5 K/uL (ref 4.0–10.5)

## 2024-03-30 NOTE — Patient Instructions (Addendum)
 Your provider has requested that you go to the basement level for lab work before leaving today. Press B on the elevator. The lab is located at the first door on the left as you exit the elevator.  You have been scheduled for an Endoscopy. Please follow written instructions given to you at your visit today.  If you use inhalers (even only as needed), please bring them with you on the day of your procedure.  If you take any of the following medications, they will need to be adjusted prior to your procedure:   DO NOT TAKE 7 DAYS PRIOR TO TEST- Trulicity (dulaglutide) Ozempic, Wegovy (semaglutide) Mounjaro (tirzepatide) Bydureon Bcise (exanatide extended release)  DO NOT TAKE 1 DAY PRIOR TO YOUR TEST Rybelsus (semaglutide) Adlyxin (lixisenatide) Victoza (liraglutide) Byetta (exanatide) ___________________________________________________________________________  Please follow up sooner if symptoms increase or worsen  Due to recent changes in healthcare laws, you may see the results of your imaging and laboratory studies on MyChart before your provider has had a chance to review them.  We understand that in some cases there may be results that are confusing or concerning to you. Not all laboratory results come back in the same time frame and the provider may be waiting for multiple results in order to interpret others.  Please give us  48 hours in order for your provider to thoroughly review all the results before contacting the office for clarification of your results.   Thank you for trusting me with your gastrointestinal care!   Ellouise Console, PA-C _______________________________________________________  If your blood pressure at your visit was 140/90 or greater, please contact your primary care physician to follow up on this.  _______________________________________________________  If you are age 56 or older, your body mass index should be between 23-30. Your Body mass index is  25.39 kg/m. If this is out of the aforementioned range listed, please consider follow up with your Primary Care Provider.  If you are age 70 or younger, your body mass index should be between 19-25. Your Body mass index is 25.39 kg/m. If this is out of the aformentioned range listed, please consider follow up with your Primary Care Provider.   ________________________________________________________  The [facility] providers would like to encourage you to use MYCHART to communicate with providers for non-urgent requests or questions.  Due to long hold times on the telephone, sending your provider a message by [facility] may be a faster and more efficient way to get a response.  Please allow 48 business hours for a response.  Please remember that this is for non-urgent requests.  _______________________________________________________

## 2024-03-30 NOTE — Progress Notes (Signed)
 Specialty Pharmacy Refill Coordination Note  Cynthia [name] is a 57 y.o. female contacted today regarding refills of specialty medication(s) Doravirine  (PIFELTRO ); Emtricitabine -Tenofovir  AF (Descovy )   Patient requested (Patient-Rptd) Pickup at [facility] at [facility] date: (Patient-Rptd) 04/01/24   Medication will be filled on 03/31/24.

## 2024-03-31 ENCOUNTER — Ambulatory Visit: Payer: Self-pay | Admitting: Physician Assistant

## 2024-04-02 ENCOUNTER — Other Ambulatory Visit (HOSPITAL_COMMUNITY): Payer: Self-pay

## 2024-04-10 ENCOUNTER — Encounter

## 2024-04-10 DIAGNOSIS — Z1231 Encounter for screening mammogram for malignant neoplasm of breast: Secondary | ICD-10-CM

## 2024-04-13 ENCOUNTER — Other Ambulatory Visit: Payer: Self-pay

## 2024-04-14 ENCOUNTER — Other Ambulatory Visit: Payer: Self-pay | Admitting: Medical

## 2024-04-15 ENCOUNTER — Other Ambulatory Visit: Payer: Self-pay

## 2024-04-15 ENCOUNTER — Ambulatory Visit
Admission: RE | Admit: 2024-04-15 | Discharge: 2024-04-15 | Disposition: A | Discharge status: Home / Self Care | Source: Ambulatory Visit

## 2024-04-15 ENCOUNTER — Other Ambulatory Visit (HOSPITAL_COMMUNITY): Payer: Self-pay

## 2024-04-15 DIAGNOSIS — Z1231 Encounter for screening mammogram for malignant neoplasm of breast: Secondary | ICD-10-CM | POA: Diagnosis not present

## 2024-04-15 MED ORDER — OMEPRAZOLE 40 MG PO CPDR
40.0000 mg | DELAYED_RELEASE_CAPSULE | Freq: Every day | ORAL | 2 refills | Status: DC
  Filled 2024-04-15: qty 30, 30d supply, fill #0
  Filled 2024-05-13: qty 30, 30d supply, fill #1

## 2024-04-21 ENCOUNTER — Ambulatory Visit: Payer: Self-pay | Admitting: Nurse Practitioner

## 2024-04-22 ENCOUNTER — Other Ambulatory Visit: Payer: Self-pay

## 2024-04-22 ENCOUNTER — Other Ambulatory Visit: Payer: Self-pay | Admitting: Internal Medicine

## 2024-04-22 DIAGNOSIS — B20 Human immunodeficiency virus [HIV] disease: Secondary | ICD-10-CM

## 2024-04-22 MED ORDER — DORAVIRINE 100 MG PO TABS
100.0000 mg | ORAL_TABLET | Freq: Every day | ORAL | 0 refills | Status: DC
  Filled 2024-04-22: qty 30, 30d supply, fill #0

## 2024-04-22 MED ORDER — DESCOVY 200-25 MG PO TABS
1.0000 | ORAL_TABLET | Freq: Every day | ORAL | 0 refills | Status: DC
Start: 2024-04-22 — End: 2024-04-30
  Filled 2024-04-22: qty 30, 30d supply, fill #0

## 2024-04-22 NOTE — Progress Notes (Signed)
 Specialty Pharmacy Ongoing Clinical Assessment Note  Cynthia [name] is a 58 y.o. female who is being followed by the specialty pharmacy service for RxSp HIV   Patient's specialty medication(s) reviewed today: Doravirine  (PIFELTRO ); Emtricitabine -Tenofovir  AF (Descovy )   Missed doses in the last 4 weeks: 0   Patient/Caregiver did not have any additional questions or concerns.   Therapeutic benefit summary: Patient is achieving benefit   Adverse events/side effects summary: No adverse events/side effects   Patient's therapy is appropriate to: Continue    Goals Addressed             This Visit's Progress    Achieve Undetectable HIV Viral Load < 20   On track    Patient is on track. Patient will maintain adherence.  Viral load remains undetectable as of 04/16/23. Due for follow-up labs, has been undetectable since 01/27/2019.        Follow up: 1 year  Powell CHRISTELLA Gallus Specialty Pharmacist

## 2024-04-22 NOTE — Progress Notes (Signed)
 Specialty Pharmacy Refill Coordination Note  Cynthia [name] is a 58 y.o. female contacted today regarding refills of specialty medication(s) Doravirine  (PIFELTRO ); Emtricitabine -Tenofovir  AF (Descovy )   Patient requested Pickup at [facility] at [place] date: 04/29/24   Medication will be filled on 04/28/24 This fill date is pending response to refill request from provider. Patient is aware and if they have not received fill by intended date they must follow up with pharmacy.

## 2024-04-30 ENCOUNTER — Ambulatory Visit: Admitting: Internal Medicine

## 2024-04-30 ENCOUNTER — Other Ambulatory Visit (HOSPITAL_COMMUNITY): Payer: Self-pay

## 2024-04-30 ENCOUNTER — Encounter: Payer: Self-pay | Admitting: Internal Medicine

## 2024-04-30 ENCOUNTER — Other Ambulatory Visit: Payer: Self-pay

## 2024-04-30 VITALS — BP 109/75 | HR 74 | Ht 65.0 in | Wt 152.0 lb

## 2024-04-30 DIAGNOSIS — Z79899 Other long term (current) drug therapy: Secondary | ICD-10-CM

## 2024-04-30 DIAGNOSIS — Z113 Encounter for screening for infections with a predominantly sexual mode of transmission: Secondary | ICD-10-CM | POA: Diagnosis not present

## 2024-04-30 DIAGNOSIS — N951 Menopausal and female climacteric states: Secondary | ICD-10-CM

## 2024-04-30 DIAGNOSIS — B20 Human immunodeficiency virus [HIV] disease: Secondary | ICD-10-CM | POA: Diagnosis not present

## 2024-04-30 MED ORDER — DORAVIRINE 100 MG PO TABS
100.0000 mg | ORAL_TABLET | Freq: Every day | ORAL | 11 refills | Status: AC
  Filled 2024-05-13 – 2024-05-26 (×2): qty 30, 30d supply, fill #0
  Filled 2024-06-25: qty 30, 30d supply, fill #1
  Filled 2024-07-29: qty 30, 30d supply, fill #2
  Filled 2024-08-26 – 2024-08-27 (×2): qty 30, 30d supply, fill #3

## 2024-04-30 MED ORDER — DESCOVY 200-25 MG PO TABS
1.0000 | ORAL_TABLET | Freq: Every day | ORAL | 11 refills | Status: AC
  Filled 2024-05-13 – 2024-05-26 (×2): qty 30, 30d supply, fill #0
  Filled 2024-06-25: qty 30, 30d supply, fill #1
  Filled 2024-07-29: qty 30, 30d supply, fill #2
  Filled 2024-08-26 – 2024-08-27 (×2): qty 30, 30d supply, fill #3

## 2024-04-30 NOTE — Progress Notes (Signed)
 RFV: follow up for hiv disease  Patient ID: Cynthia [name], female   DOB: [birth year], 58 y.o.   MRN: [id number]  HPI 58yo F with well controlled HIV disease currently on descovy -doravirine . CD 4 count of 728/VL<20 in 2024 who is now here for annual labs. Overall she reports to be in good health, only change is that she has been treated for gerd, takes omeprazole  daily to relieve symptoms. She has upcoming egd with dr leigh. She tries to decrease intake of foods associated with gerd  ROS: Still getting hotflashes. In the morning mostly. She follows with gyn but presently not interested in hormone replacement   Outpatient Encounter Medications as of 04/30/2024  Medication Sig   Cetirizine HCl (ZYRTEC PO) Take by mouth.   Cholecalciferol  (VITAMIN D ) 50 MCG (2000 UT) tablet Take 1 tablet (2,000 Units total) by mouth daily.   doravirine  (PIFELTRO ) 100 MG TABS tablet Take 1 tablet (100 mg total) by mouth daily.   emtricitabine -tenofovir  AF (DESCOVY ) 200-25 MG tablet Take 1 tablet by mouth daily.   lisinopril  (ZESTRIL ) 10 MG tablet Take 1 tablet (10 mg total) by mouth daily.   Multiple Vitamin (MULTIVITAMIN PO) Take by mouth.   omeprazole  (PRILOSEC) 40 MG capsule Take 1 capsule (40 mg total) by mouth daily.   psyllium (METAMUCIL) 58.6 % packet Take 1 packet by mouth daily.   rosuvastatin  (CRESTOR ) 10 MG tablet Take 1 tablet (10 mg total) by mouth daily.   [DISCONTINUED] ondansetron  (ZOFRAN -ODT) 4 MG disintegrating tablet Take 1 tablet (4 mg total) by mouth every 8 (eight) hours as needed for nausea or vomiting. (Patient not taking: Reported on 04/30/2024)   No facility-administered encounter medications on file as of 04/30/2024.     Patient Active Problem List   Diagnosis Date Noted   Stress incontinence 07/30/2023   Encounter for annual physical exam 06/23/2021   Gastroesophageal reflux disease 06/23/2021   Weight gain 06/23/2021   Hyperlipidemia 06/23/2021   Vitamin D  deficiency  06/23/2021   CMV retinitis (HCC)    Hypertension    Presence of intraocular lens 08/23/2011   Retinal detachment of right eye with multiple breaks 08/23/2011   Asymptomatic HIV infection, CD4 >=500 (HCC) 11/07/2006     Health Maintenance Due  Topic Date Due   Zoster Vaccines- Shingrix (1 of 2) Never done   Hepatitis B Vaccines 19-59 Average Risk (3 of 3 - 19+ 3-dose series) 10/17/2001   COVID-19 Vaccine (4 - 2025-26 season) 04/06/2024     Review of Systems 12 point ros is otherwise negative Physical Exam   BP 109/75   Pulse 74   Ht 5' 5 (1.651 m)   Wt 152 lb (68.9 kg)   LMP 05/07/2019   SpO2 99%   BMI 25.29 kg/m   Physical Exam  Constitutional:  oriented to person, place, and time. appears well-developed and well-nourished. No distress.  HENT: [place]/AT, PERRLA, no scleral icterus Mouth/Throat: Oropharynx is clear and moist. No oropharyngeal exudate.  Cardiovascular: Normal rate, regular rhythm and normal heart sounds. Exam reveals no gallop and no friction rub.  No murmur heard.  Pulmonary/Chest: Effort normal and breath sounds normal. No respiratory distress.  has no wheezes.  Neck = supple, no nuchal rigidity Lymphadenopathy: no cervical adenopathy. No axillary adenopathy Neurological: alert and oriented to person, place, and time.  Skin: Skin is warm and dry. No rash noted. No erythema.  Psychiatric: a normal mood and affect.  behavior is normal.   Lab Results  Component Value  Date   CD4TCELL 37 04/16/2023   Lab Results  Component Value Date   CD4TABS 728 04/16/2023   CD4TABS 764 04/12/2022   CD4TABS 701 04/11/2021   Lab Results  Component Value Date   HIV1RNAQUANT Not Detected 04/16/2023   Lab Results  Component Value Date   HEPBSAB No 09/30/2006   Lab Results  Component Value Date   LABRPR NON-REACTIVE 04/16/2023    CBC Lab Results  Component Value Date   WBC 4.5 03/30/2024   RBC 3.77 (L) 03/30/2024   HGB 12.3 03/30/2024   HCT 34.9 (L)  03/30/2024   PLT 196.0 03/30/2024   MCV 92.7 03/30/2024   MCH 31.2 07/30/2023   MCHC 35.1 03/30/2024   RDW 13.3 03/30/2024   LYMPHSABS 1.8 03/30/2024   MONOABS 0.3 03/30/2024   EOSABS 0.4 03/30/2024    BMET Lab Results  Component Value Date   NA 141 07/30/2023   K 4.9 07/30/2023   CL 106 07/30/2023   CO2 22 07/30/2023   GLUCOSE 99 07/30/2023   BUN 20 07/30/2023   CREATININE 1.03 (H) 07/30/2023   CALCIUM  9.8 07/30/2023   GFRNONAA 43 (L) 02/13/2021   GFRAA 54 (L) 01/26/2021      Assessment and Plan HIV disease = will check labs to see that she is still well controlled. Anticipate that she is and we will refill her descovy  and doravarine  Long term medication management = will check cr to see that she is at baseline.  GERD= continue on omeprazole .  Upcoming egd for gerd rule out hpylori  Menopausal symptoms = continue to track symptoms. For now, no HRT  Health maintenance = she will get flu and covid vaccine through work

## 2024-05-01 ENCOUNTER — Other Ambulatory Visit: Payer: Self-pay

## 2024-05-01 LAB — T-HELPER CELL (CD4) - (RCID CLINIC ONLY)
CD4 % Helper T Cell: 46 % (ref 33–65)
CD4 T Cell Abs: 926 /uL (ref 400–1790)

## 2024-05-07 LAB — COMPLETE METABOLIC PANEL WITHOUT GFR
AG Ratio: 1.9 (calc) (ref 1.0–2.5)
ALT: 18 U/L (ref 6–29)
AST: 17 U/L (ref 10–35)
Albumin: 4.5 g/dL (ref 3.6–5.1)
Alkaline phosphatase (APISO): 53 U/L (ref 37–153)
BUN/Creatinine Ratio: 28 (calc) — ABNORMAL HIGH (ref 6–22)
BUN: 29 mg/dL — ABNORMAL HIGH (ref 7–25)
CO2: 26 mmol/L (ref 20–32)
Calcium: 9.6 mg/dL (ref 8.6–10.4)
Chloride: 106 mmol/L (ref 98–110)
Creat: 1.05 mg/dL — ABNORMAL HIGH (ref 0.50–1.03)
Globulin: 2.4 g/dL (ref 1.9–3.7)
Glucose, Bld: 119 mg/dL — ABNORMAL HIGH (ref 65–99)
Potassium: 4.3 mmol/L (ref 3.5–5.3)
Sodium: 140 mmol/L (ref 135–146)
Total Bilirubin: 0.3 mg/dL (ref 0.2–1.2)
Total Protein: 6.9 g/dL (ref 6.1–8.1)

## 2024-05-07 LAB — CBC WITH DIFFERENTIAL/PLATELET
Absolute Lymphocytes: 2106 {cells}/uL (ref 850–3900)
Absolute Monocytes: 313 {cells}/uL (ref 200–950)
Basophils Absolute: 32 {cells}/uL (ref 0–200)
Basophils Relative: 0.6 %
Eosinophils Absolute: 448 {cells}/uL (ref 15–500)
Eosinophils Relative: 8.3 %
HCT: 36 % (ref 35.0–45.0)
Hemoglobin: 12.2 g/dL (ref 11.7–15.5)
MCH: 32.1 pg (ref 27.0–33.0)
MCHC: 33.9 g/dL (ref 32.0–36.0)
MCV: 94.7 fL (ref 80.0–100.0)
MPV: 9.6 fL (ref 7.5–12.5)
Monocytes Relative: 5.8 %
Neutro Abs: 2500 {cells}/uL (ref 1500–7800)
Neutrophils Relative %: 46.3 %
Platelets: 205 Thousand/uL (ref 140–400)
RBC: 3.8 Million/uL (ref 3.80–5.10)
RDW: 12 % (ref 11.0–15.0)
Total Lymphocyte: 39 %
WBC: 5.4 Thousand/uL (ref 3.8–10.8)

## 2024-05-07 LAB — HIV-1 RNA QUANT-NO REFLEX-BLD
HIV 1 RNA Quant: NOT DETECTED {copies}/mL
HIV-1 RNA Quant, Log: NOT DETECTED {Log_copies}/mL

## 2024-05-07 LAB — RPR: RPR Ser Ql: NONREACTIVE

## 2024-05-13 ENCOUNTER — Encounter: Payer: Self-pay | Admitting: Pharmacist

## 2024-05-13 ENCOUNTER — Other Ambulatory Visit: Payer: Self-pay

## 2024-05-14 ENCOUNTER — Encounter: Payer: Self-pay | Admitting: Gastroenterology

## 2024-05-21 ENCOUNTER — Ambulatory Visit: Admitting: Gastroenterology

## 2024-05-21 ENCOUNTER — Other Ambulatory Visit: Payer: Self-pay | Admitting: Gastroenterology

## 2024-05-21 ENCOUNTER — Encounter: Payer: Self-pay | Admitting: Gastroenterology

## 2024-05-21 VITALS — BP 102/70 | HR 78 | Temp 98.0°F | Resp 17 | Ht 60.75 in | Wt 151.0 lb

## 2024-05-21 DIAGNOSIS — K449 Diaphragmatic hernia without obstruction or gangrene: Secondary | ICD-10-CM

## 2024-05-21 DIAGNOSIS — K219 Gastro-esophageal reflux disease without esophagitis: Secondary | ICD-10-CM | POA: Diagnosis not present

## 2024-05-21 DIAGNOSIS — K2289 Other specified disease of esophagus: Secondary | ICD-10-CM | POA: Diagnosis not present

## 2024-05-21 DIAGNOSIS — K319 Disease of stomach and duodenum, unspecified: Secondary | ICD-10-CM | POA: Diagnosis not present

## 2024-05-21 DIAGNOSIS — R1013 Epigastric pain: Secondary | ICD-10-CM | POA: Diagnosis not present

## 2024-05-21 DIAGNOSIS — K317 Polyp of stomach and duodenum: Secondary | ICD-10-CM | POA: Diagnosis not present

## 2024-05-21 DIAGNOSIS — I10 Essential (primary) hypertension: Secondary | ICD-10-CM | POA: Diagnosis not present

## 2024-05-21 DIAGNOSIS — E785 Hyperlipidemia, unspecified: Secondary | ICD-10-CM | POA: Diagnosis not present

## 2024-05-21 MED ORDER — SODIUM CHLORIDE 0.9 % IV SOLN: 500.0000 mL | Freq: Once | INTRAVENOUS | Status: DC

## 2024-05-21 NOTE — Progress Notes (Signed)
 Report to PACU, RN, vss, BBS= Clear.

## 2024-05-21 NOTE — Patient Instructions (Addendum)
 Resume previous diet. Continue present medications. Long term use the lowest dose of PPI needed to control symptoms. Can try taking omeprazole  40 mg every other day, or transition to 20 mg/ day dosing, use PRN moving forward.  Awaiting pathology results.  Handouts provided on hiatal hernia and polyps   YOU HAD AN ENDOSCOPIC PROCEDURE TODAY AT THE [facility]:   Refer to the procedure report that was given to you for any specific questions about what was found during the examination.  If the procedure report does not answer your questions, please call your gastroenterologist to clarify.  If you requested that your care partner not be given the details of your procedure findings, then the procedure report has been included in a sealed envelope for you to review at your convenience later.  YOU SHOULD EXPECT: Some feelings of bloating in the abdomen. Passage of more gas than usual.  Walking can help get rid of the air that was put into your GI tract during the procedure and reduce the bloating. If you had a lower endoscopy (such as a colonoscopy or flexible sigmoidoscopy) you may notice spotting of blood in your stool or on the toilet paper. If you underwent a bowel prep for your procedure, you may not have a normal bowel movement for a few days.  Please Note:  You might notice some irritation and congestion in your nose or some drainage.  This is from the oxygen used during your procedure.  There is no need for concern and it should clear up in a day or so.  SYMPTOMS TO REPORT IMMEDIATELY:  Following upper endoscopy (EGD)  Vomiting of blood or coffee ground material  New chest pain or pain under the shoulder blades  Painful or persistently difficult swallowing  New shortness of breath  Fever of 100F or higher  Black, tarry-looking stools  For urgent or emergent issues, a gastroenterologist can be reached at any hour by calling [phone]. Do not use MyChart messaging for urgent  concerns.    DIET:  We do recommend a small meal at first, but then you may proceed to your regular diet.  Drink plenty of fluids but you should avoid alcoholic beverages for 24 hours.  ACTIVITY:  You should plan to take it easy for the rest of today and you should NOT DRIVE or use heavy machinery until tomorrow (because of the sedation medicines used during the test).    FOLLOW UP: Our staff will call the number listed on your records the next business day following your procedure.  We will call around 7:15- 8:00 am to check on you and address any questions or concerns that you may have regarding the information given to you following your procedure. If we do not reach you, we will leave a message.     If any biopsies were taken you will be contacted by phone or by letter within the next 1-3 weeks.  Please call us  at [phone] if you have not heard about the biopsies in 3 weeks.    SIGNATURES/CONFIDENTIALITY: You and/or your care partner have signed paperwork which will be entered into your electronic medical record.  These signatures attest to the fact that that the information above on your After Visit Summary has been reviewed and is understood.  Full responsibility of the confidentiality of this discharge information lies with you and/or your care-partner.

## 2024-05-21 NOTE — Progress Notes (Signed)
 Called to room to assist during endoscopic procedure.  Patient ID and intended procedure confirmed with present staff. Received instructions for my participation in the procedure from the performing physician.

## 2024-05-21 NOTE — Op Note (Signed)
 [facility] Patient Name: Cynthia [name] Procedure Date: 05/21/2024 8:31 AM MRN: [id number] Endoscopist: Elspeth P. Leigh , MD, [id number] Age: 58 Referring MD:  Date of Birth: 09-12-1965 Gender: Female Account #: [id number] Procedure:                Upper GI endoscopy Indications:              Epigastric abdominal pain, Follow-up of                            gastro-esophageal reflux disease - improved on                            omeprazole  40mg  / day Medicines:                Monitored Anesthesia Care Procedure:                Pre-Anesthesia Assessment:                           - Prior to the procedure, a History and Physical                            was performed, and patient medications and                            allergies were reviewed. The patient's tolerance of                            previous anesthesia was also reviewed. The risks                            and benefits of the procedure and the sedation                            options and risks were discussed with the patient.                            All questions were answered, and informed consent                            was obtained. Prior Anticoagulants: The patient has                            taken no anticoagulant or antiplatelet agents. ASA                            Grade Assessment: III - A patient with severe                            systemic disease. After reviewing the risks and                            benefits, the patient was deemed in satisfactory  condition to undergo the procedure.                           After obtaining informed consent, the endoscope was                            passed under direct vision. Throughout the                            procedure, the patient's blood pressure, pulse, and                            oxygen saturations were monitored continuously. The                            Olympus Scope SN [id number] was  introduced through the                            mouth, and advanced to the second part of duodenum.                            The upper GI endoscopy was accomplished without                            difficulty. The patient tolerated the procedure                            well. Scope In: Scope Out: Findings:                 Esophagogastric landmarks were identified: the                            Z-line was found at 34 cm, the gastroesophageal                            junction was found at 34 cm and the upper extent of                            the gastric folds was found at 36 cm from the                            incisors. Z line very slightly irregular but did                            not meet criteria for Barrett's biopsies.                           A 2 cm hiatal hernia was present.                           The gastroesophageal flap valve was visualized  endoscopically and classified as Hill Grade III                            (minimal fold, loose to endoscope, hiatal hernia                            likely).                           The exam of the esophagus was otherwise normal.                           A few small sessile polyps were found in the                            gastric fundus. Likely fundic gland polyps.                            Biopsies were taken with a cold forceps for                            histology.                           The exam of the stomach was otherwise normal.                           Biopsies were taken with a cold forceps for                            Helicobacter pylori testing.                           The examined duodenum was normal. Complications:            No immediate complications. Estimated blood loss:                            Minimal. Estimated Blood Loss:     Estimated blood loss was minimal. Impression:               - Esophagogastric landmarks identified. No Barrett's                            - 2 cm hiatal hernia.                           - Gastroesophageal flap valve classified as Hill                            Grade III (minimal fold, loose to endoscope, hiatal                            hernia likely).                           - Normal esophagus  otherwise.                           - A few gastric polyps. Biopsied. Likely benign                            fundic gland polyps                           - Normal stomach otherwise - biopsies taken to rule                            out H pylori                           - Normal examined duodenum. Recommendation:           - Patient has a contact number available for                            emergencies. The signs and symptoms of potential                            delayed complications were discussed with the                            patient. Return to normal activities tomorrow.                            Written discharge instructions were provided to the                            patient.                           - Resume previous diet.                           - Continue present medications.                           - Long term use the lowest dose of PPI needed to                            control symptoms. Can try taking omeprazole  40mg                             every other day, or transition to 20mg  / day                            dosing, use PRN moving forward                           - Await pathology results. Elspeth P. Suhani Stillion, MD 05/21/2024 8:53:10 AM This report has been signed electronically.

## 2024-05-21 NOTE — Progress Notes (Signed)
 [facility] History and Physical   Primary Care Physician:  Early, Camie BRAVO, NP   Reason for Procedure:   Epigastric pain / GERD  Plan:    EGD     HPI: Cynthia [name] is a 58 y.o. female  here for EGD to evaluate epigastric pain /  GERD, history of dark stools. On omeprazole  40mg  / day. First time EGD. She feels improved on omeprazole .   Otherwise feels well without any cardiopulmonary symptoms.   I have discussed risks / benefits of anesthesia and endoscopic procedure with Marton LELON Sharps and they wish to proceed with the exams as outlined today.    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HCC)    COVID-19    DISEASE, HYPERTENSIVE HEART, BENIGN, W/HF 11/07/2006   Qualifier: Diagnosis of  By: Raymond RN, Delene     GERD (gastroesophageal reflux disease)    HIV (human immunodeficiency virus infection) (HCC)    HIV infection (HCC)    Hyperlipidemia    Hypertension     Past Surgical History:  Procedure Laterality Date   EYE SURGERY     Six surgeries   FINGER SURGERY  05/2010   SCREWS IN AND OUT OF FINGER   LASER VAPORIZATION OF OF CONDYLOMA     MANDIBLE SURGERY     NOSE SURGERY      Prior to Admission medications   Medication Sig Start Date End Date Taking? Authorizing Provider  Cetirizine HCl (ZYRTEC PO) Take by mouth.   Yes [provider]  Cholecalciferol  (VITAMIN D ) 50 MCG (2000 UT) tablet Take 1 tablet (2,000 Units total) by mouth daily. 07/30/23  Yes Early, Camie BRAVO, NP  doravirine  (PIFELTRO ) 100 MG TABS tablet Take 1 tablet (100 mg total) by mouth daily. 04/30/24  Yes Luiz Channel, MD  emtricitabine -tenofovir  AF (DESCOVY ) 200-25 MG tablet Take 1 tablet by mouth daily. 04/30/24  Yes Luiz Channel, MD  lisinopril  (ZESTRIL ) 10 MG tablet Take 1 tablet (10 mg total) by mouth daily. 07/30/23  Yes Early, Sara E, NP  Multiple Vitamin (MULTIVITAMIN PO) Take by mouth.   Yes [provider]  omeprazole  (PRILOSEC) 40  MG capsule Take 1 capsule (40 mg total) by mouth daily. 04/15/24  Yes Early, Sara E, NP  psyllium (METAMUCIL) 58.6 % packet Take 1 packet by mouth daily.   Yes [provider]  rosuvastatin  (CRESTOR ) 10 MG tablet Take 1 tablet (10 mg total) by mouth daily. 07/30/23  Yes Early, Sara E, NP    Current Outpatient Medications  Medication Sig Dispense Refill   Cetirizine HCl (ZYRTEC PO) Take by mouth.     Cholecalciferol  (VITAMIN D ) 50 MCG (2000 UT) tablet Take 1 tablet (2,000 Units total) by mouth daily. 90 tablet 3   doravirine  (PIFELTRO ) 100 MG TABS tablet Take 1 tablet (100 mg total) by mouth daily. 30 tablet 11   emtricitabine -tenofovir  AF (DESCOVY ) 200-25 MG tablet Take 1 tablet by mouth daily. 30 tablet 11   lisinopril  (ZESTRIL ) 10 MG tablet Take 1 tablet (10 mg total) by mouth daily. 90 tablet 3   Multiple Vitamin (MULTIVITAMIN PO) Take by mouth.     omeprazole  (PRILOSEC) 40 MG capsule Take 1 capsule (40 mg total) by mouth daily. 30 capsule 2   psyllium (METAMUCIL) 58.6 % packet Take 1 packet by mouth daily.     rosuvastatin  (CRESTOR ) 10 MG tablet Take 1 tablet (10 mg total) by mouth daily. 90 tablet 3  Current Facility-Administered Medications  Medication Dose Route Frequency Provider Last Rate Last Admin   0.9 %  sodium chloride  infusion  500 mL Intravenous Once Burnetta Kohls, Elspeth SQUIBB, MD        Allergies as of 05/21/2024 - Review Complete 05/21/2024  Allergen Reaction Noted   Dapsone Shortness Of Breath 10/02/2007   Amoxicillin Rash 01/15/2012   Nitrofurantoin  Nausea And Vomiting and Rash 11/04/2021    Family History  Problem Relation Age of Onset   Hypertension Mother    Diabetes Father    Hypertension Father    Colon polyps Father    Cancer Maternal Grandmother        COLON   Heart disease Maternal Grandmother    Colon cancer Maternal Grandmother    Breast cancer Paternal Grandmother    Diabetes Paternal Grandmother    Colon cancer Cousin    Esophageal cancer  Neg Hx    Stomach cancer Neg Hx    Rectal cancer Neg Hx     Social History   Socioeconomic History   Marital status: Married    Spouse name: Not on file   Number of children: Not on file   Years of education: Not on file   Highest education level: Some college, no degree  Occupational History   Not on file  Tobacco Use   Smoking status: Former   Smokeless tobacco: Never   Tobacco comments:    Quit 1996  Vaping Use   Vaping status: Never Used  Substance and Sexual Activity   Alcohol use: Yes    Alcohol/week: 0.0 standard drinks of alcohol    Comment: socially   Drug use: No   Sexual activity: Yes    Partners: Male    Birth control/protection: Post-menopausal    Comment: DECLINED CONDOMS  Other Topics Concern   Not on file  Social History Narrative   Not on file   Social Drivers of Health   Financial Resource Strain: Low Risk  (03/17/2024)   Overall Financial Resource Strain (CARDIA)    Difficulty of Paying Living Expenses: Not hard at all  Food Insecurity: No Food Insecurity (03/17/2024)   Hunger Vital Sign    Worried About Running Out of Food in the Last Year: Never true    Ran Out of Food in the Last Year: Never true  Transportation Needs: No Transportation Needs (03/17/2024)   PRAPARE - Administrator, Civil Service (Medical): No    Lack of Transportation (Non-Medical): No  Physical Activity: Insufficiently Active (03/17/2024)   Exercise Vital Sign    Days of Exercise per Week: 2 days    Minutes of Exercise per Session: 50 min  Stress: Stress Concern Present (03/17/2024)   [employer] of Occupational Health - Occupational Stress Questionnaire    Feeling of Stress: Rather much  Social Connections: Moderately Integrated (03/17/2024)   Social Connection and Isolation Panel    Frequency of Communication with Friends and Family: Twice a week    Frequency of Social Gatherings with Friends and Family: Once a week    Attends Religious Services: More  than 4 times per year    Active Member of [employer] or Organizations: No    Attends Engineer, structural: Not on file    Marital Status: Married  Catering manager Violence: Not on file    Review of Systems: All other review of systems negative except as mentioned in the HPI.  Physical Exam: Vital signs BP 109/74   Pulse 75  Temp 98 F (36.7 C) (Temporal)   Ht 5' 0.75 (1.543 m)   Wt 151 lb (68.5 kg)   LMP 05/07/2019   SpO2 96%   BMI 28.77 kg/m   General:   Alert,  Well-developed, pleasant and cooperative in NAD Lungs:  Clear throughout to auscultation.   Heart:  Regular rate and rhythm Abdomen:  Soft, nontender and nondistended.   Neuro/Psych:  Alert and cooperative. Normal mood and affect. A and O x 3  Marcey Naval, MD [facility]

## 2024-05-21 NOTE — Progress Notes (Signed)
 I have reviewed the patient's medical history in detail and updated the computerized patient record.

## 2024-05-22 ENCOUNTER — Telehealth: Payer: Self-pay

## 2024-05-22 ENCOUNTER — Other Ambulatory Visit: Payer: Self-pay

## 2024-05-22 NOTE — Telephone Encounter (Signed)
  Follow up Call-     05/21/2024    7:39 AM  Call back number  Post procedure Call Back phone  # [phone]  Permission to leave phone message Yes     Patient questions:  Do you have a fever, pain , or abdominal swelling? No. Pain Score  0 *  Have you tolerated food without any problems? Yes.    Have you been able to return to your normal activities? Yes.    Do you have any questions about your discharge instructions: Diet   No. Medications  No. Follow up visit  No.  Do you have questions or concerns about your Care? No.  Actions: * If pain score is 4 or above: No action needed, pain <4.

## 2024-05-25 LAB — SURGICAL PATHOLOGY

## 2024-05-26 ENCOUNTER — Ambulatory Visit: Payer: Self-pay | Admitting: Gastroenterology

## 2024-05-26 ENCOUNTER — Other Ambulatory Visit: Payer: Self-pay

## 2024-05-26 NOTE — Progress Notes (Signed)
 Specialty Pharmacy Refill Coordination Note  Cynthia [name] is a 58 y.o. female contacted today regarding refills of specialty medication(s) Doravirine  (PIFELTRO ); Emtricitabine -Tenofovir  AF (Descovy )   Patient requested Pickup at [facility] at [address] date: 05/28/24   Medication will be filled on 05/27/24.

## 2024-06-18 ENCOUNTER — Encounter (HOSPITAL_BASED_OUTPATIENT_CLINIC_OR_DEPARTMENT_OTHER): Payer: Self-pay | Admitting: Emergency Medicine

## 2024-06-18 ENCOUNTER — Emergency Department (HOSPITAL_BASED_OUTPATIENT_CLINIC_OR_DEPARTMENT_OTHER)

## 2024-06-18 ENCOUNTER — Observation Stay (HOSPITAL_BASED_OUTPATIENT_CLINIC_OR_DEPARTMENT_OTHER)
Admission: EM | Admit: 2024-06-18 | Discharge: 2024-06-20 | Disposition: A | Discharge status: Home / Self Care | Attending: Internal Medicine | Admitting: Internal Medicine

## 2024-06-18 ENCOUNTER — Other Ambulatory Visit: Payer: Self-pay

## 2024-06-18 DIAGNOSIS — R2990 Unspecified symptoms and signs involving the nervous system: Principal | ICD-10-CM

## 2024-06-18 DIAGNOSIS — R42 Dizziness and giddiness: Secondary | ICD-10-CM | POA: Diagnosis not present

## 2024-06-18 DIAGNOSIS — R2689 Other abnormalities of gait and mobility: Secondary | ICD-10-CM | POA: Diagnosis present

## 2024-06-18 DIAGNOSIS — F1090 Alcohol use, unspecified, uncomplicated: Secondary | ICD-10-CM | POA: Insufficient documentation

## 2024-06-18 DIAGNOSIS — Z21 Asymptomatic human immunodeficiency virus [HIV] infection status: Secondary | ICD-10-CM | POA: Diagnosis present

## 2024-06-18 DIAGNOSIS — R112 Nausea with vomiting, unspecified: Secondary | ICD-10-CM | POA: Diagnosis not present

## 2024-06-18 DIAGNOSIS — I6389 Other cerebral infarction: Principal | ICD-10-CM | POA: Insufficient documentation

## 2024-06-18 DIAGNOSIS — I10 Essential (primary) hypertension: Secondary | ICD-10-CM | POA: Diagnosis not present

## 2024-06-18 DIAGNOSIS — E785 Hyperlipidemia, unspecified: Secondary | ICD-10-CM | POA: Diagnosis not present

## 2024-06-18 DIAGNOSIS — Z8679 Personal history of other diseases of the circulatory system: Secondary | ICD-10-CM

## 2024-06-18 DIAGNOSIS — I6381 Other cerebral infarction due to occlusion or stenosis of small artery: Secondary | ICD-10-CM | POA: Diagnosis not present

## 2024-06-18 DIAGNOSIS — I639 Cerebral infarction, unspecified: Secondary | ICD-10-CM

## 2024-06-18 DIAGNOSIS — Z0000 Encounter for general adult medical examination without abnormal findings: Secondary | ICD-10-CM

## 2024-06-18 LAB — COMPREHENSIVE METABOLIC PANEL WITH GFR
ALT: 19 U/L (ref 0–44)
AST: 22 U/L (ref 15–41)
Albumin: 4.6 g/dL (ref 3.5–5.0)
Alkaline Phosphatase: 61 U/L (ref 38–126)
Anion gap: 13 (ref 5–15)
BUN: 28 mg/dL — ABNORMAL HIGH (ref 6–20)
CO2: 22 mmol/L (ref 22–32)
Calcium: 9.7 mg/dL (ref 8.9–10.3)
Chloride: 103 mmol/L (ref 98–111)
Creatinine, Ser: 1.15 mg/dL — ABNORMAL HIGH (ref 0.44–1.00)
GFR, Estimated: 56 mL/min — ABNORMAL LOW (ref >60)
Glucose, Bld: 113 mg/dL — ABNORMAL HIGH (ref 70–99)
Potassium: 4.2 mmol/L (ref 3.5–5.1)
Sodium: 138 mmol/L (ref 135–145)
Total Bilirubin: 0.3 mg/dL (ref 0.0–1.2)
Total Protein: 7.3 g/dL (ref 6.5–8.1)

## 2024-06-18 LAB — DIFFERENTIAL
Abs Immature Granulocytes: 0.03 K/uL (ref 0.00–0.07)
Basophils Absolute: 0 K/uL (ref 0.0–0.1)
Basophils Relative: 0 %
Eosinophils Absolute: 0.2 K/uL (ref 0.0–0.5)
Eosinophils Relative: 2 %
Immature Granulocytes: 0 %
Lymphocytes Relative: 23 %
Lymphs Abs: 1.9 K/uL (ref 0.7–4.0)
Monocytes Absolute: 0.4 K/uL (ref 0.1–1.0)
Monocytes Relative: 5 %
Neutro Abs: 5.9 K/uL (ref 1.7–7.7)
Neutrophils Relative %: 70 %

## 2024-06-18 LAB — CBG MONITORING, ED: Glucose-Capillary: 109 mg/dL — ABNORMAL HIGH (ref 70–99)

## 2024-06-18 LAB — CBC
HCT: 36.3 % (ref 36.0–46.0)
Hemoglobin: 12.5 g/dL (ref 12.0–15.0)
MCH: 31.8 pg (ref 26.0–34.0)
MCHC: 34.4 g/dL (ref 30.0–36.0)
MCV: 92.4 fL (ref 80.0–100.0)
Platelets: 212 K/uL (ref 150–400)
RBC: 3.93 MIL/uL (ref 3.87–5.11)
RDW: 12.1 % (ref 11.5–15.5)
WBC: 8.5 K/uL (ref 4.0–10.5)
nRBC: 0 % (ref 0.0–0.2)

## 2024-06-18 LAB — PROTIME-INR
INR: 0.9 (ref 0.8–1.2)
Prothrombin Time: 12.7 s (ref 11.4–15.2)

## 2024-06-18 LAB — ETHANOL: Alcohol, Ethyl (B): <15 mg/dL (ref <15)

## 2024-06-18 LAB — APTT: aPTT: 24 s (ref 24–36)

## 2024-06-18 MED ORDER — SODIUM CHLORIDE 0.9% FLUSH
3.0000 mL | Freq: Once | INTRAVENOUS | Status: AC
  Administered 2024-06-18: 3 mL via INTRAVENOUS

## 2024-06-18 NOTE — Progress Notes (Signed)
 Plan of Care Note for accepted transfer   Patient: Cynthia [name] MRN: [id number]   DOA: 06/18/2024  Facility requesting transfer: [facility]   Requesting Provider: Dr. Neysa   Reason for transfer: CVA/TIA workup   Facility course: 58 yr old female with HTN and HIV (CD4 926 and VL undetectable in September 2025) who presents with acute-onset of imbalance, leaning to the left, associated with N/V.   Code stroke was activated in the ED, there were no acute findings on head CT, and teleneurology recommends admission for stroke workup.   Plan of care: The patient is accepted for admission to Telemetry unit, at [facility].   Author: Evalene GORMAN Sprinkles, MD 06/18/2024  Check [email or web address] for on-call coverage.  Nursing staff, Please call [facility] Admits & Consults System-Wide number on Amion as soon as patient's arrival, so appropriate admitting provider can evaluate the pt.

## 2024-06-18 NOTE — Consult Note (Signed)
 [facility]rology Consult Services   Patient Name:   Cynthia [name], Cynthia [name] Date of Birth:   [birth year] Identification Number:   MRN - [id number] Date of Service:   06/18/2024 20:05:08  Diagnosis:       R26.89 - Imbalance  Impression:      58yo F with a history of HTN, HLD, HIV, presenting with imbalance and nausea/vomiting. NIHSS of 0 with normal gait on repeat testing. CT head negative. No intervention given symptom improvement and lack of deficits on exam. Recommend inpatient stroke evaluation given lack of a similar presentation and comorbidities.  Our recommendations are outlined below.  Recommendations:        Stroke/Telemetry Floor       Neuro Checks (Q4)       Bedside Swallow Eval       DVT Prophylaxis       IV Fluids, Normal Saline       Head of Bed 30 Degrees       Euglycemia and Avoid Hyperthermia (PRN Acetaminophen )       Initiate or continue Aspirin 81 MG daily       Antihypertensives PRN if Blood pressure is greater than 220/120 or there is a concern for End organ damage/contraindications for permissive HTN. If blood pressure is greater than 220/120 give labetalol PO or IV or Vasotec IV with a goal of 15% reduction in BP during the first 24 hours.       MRI brain without contrast.       MRA head and neck.       TTE with bubble, lipids, HbA1c.       PT/OT.  Sign Out:       Discussed with Emergency Department Provider    ------------------------------------------------------------------------------  Advanced Imaging: Advanced Imaging Deferred because:  Non-disabling symptoms as verified by the patient; no cortical signs so not consistent with LVO   Metrics: Last Known Well: 06/18/2024 17:30:00 Arrival Time: 06/18/2024 19:30:00 Initial Response Time: 06/18/2024 20:07:27 Symptoms: imbalance. Initial patient interaction: 06/18/2024 20:09:10 NIHSS Assessment Completed: 06/18/2024 20:16:16 Patient is not a candidate for  Thrombolytic. Thrombolytic Medical Decision: 06/18/2024 20:16:17 Patient was not deemed candidate for Thrombolytic because of following reasons: Stroke severity too mild (non-disabling) .  CT Head: I personally reviewed all the CT images that were available to me and it showed: no acute changes.  Primary Provider Notified of Diagnostic Impression and Management Plan on: 06/18/2024 20:38:29    ------------------------------------------------------------------------------  History of Present Illness: Patient is a 58 year old Female.  Patient was brought by private transportation with symptoms of imbalance. 58yo F with a history of HTN, HLD, HIV, presenting with imbalance and nausea/vomiting. Patient finished a spin class around 1730 and felt normal. Around 1815 she suddenly felt as if she was leaning to the left. Patient assisted herself to the floor as she thought she may fall. She has had several episodes of emesis since that time. She did briefly feel dizzy, describing vertigo, but none currently. No headache, chest pain, speech changes, extremity weakness or numbness. 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 No history of vertigo. Currently nausea improved.    Past Medical History:      Hypertension      Hyperlipidemia      There is no history of Stroke  Medications:  No Anticoagulant use  No Antiplatelet use Reviewed EMR for current medications  Allergies:  Reviewed  Social History: Smoking: No Alcohol  Use: Yes Drug Use: No  Family History:  There is no family history of premature cerebrovascular disease pertinent to this consultation  ROS : 14 Points Review of Systems was performed and was negative except mentioned in HPI.  Past Surgical History: There Is No Surgical History Contributory To Today's Visit     Examination: BP(122/93), Pulse(99), Blood Glucose(109) 1A: Level of Consciousness - Alert; keenly  responsive + 0 1B: Ask Month and Age - Both Questions Right + 0 1C: Blink Eyes & Squeeze Hands - Performs Both Tasks + 0 2: Test Horizontal Extraocular Movements - Normal + 0 3: Test Visual Fields - No Visual Loss + 0 4: Test Facial Palsy (Use Grimace if Obtunded) - Normal symmetry + 0 5A: Test Left Arm Motor Drift - No Drift for 10 Seconds + 0 5B: Test Right Arm Motor Drift - No Drift for 10 Seconds + 0 6A: Test Left Leg Motor Drift - No Drift for 5 Seconds + 0 6B: Test Right Leg Motor Drift - No Drift for 5 Seconds + 0 7: Test Limb Ataxia (FNF/Heel-Shin) - No Ataxia + 0 8: Test Sensation - Normal; No sensory loss + 0 9: Test Language/Aphasia - Normal; No aphasia + 0 10: Test Dysarthria - Normal + 0 11: Test Extinction/Inattention - No abnormality + 0  NIHSS Score: 0  NIHSS Free Text : normal gait  Pre-Morbid Modified Rankin Scale: 0 Points = No symptoms at all  Spoke with : Dr. Neysa I reviewed the available imaging via Rapid and initiated discussion with the primary provider  This consult was conducted in real time using interactive audio and video technology. Patient was informed of the technology being used for this visit and agreed to proceed. Patient located in hospital and provider located at home/office setting.   Patient is being evaluated for possible acute neurologic impairment and high probability of imminent or life-threatening deterioration. I spent total of 35 minutes providing care to this patient, including time for face to face visit via telemedicine, review of medical records, imaging studies and discussion of findings with providers, the patient and/or family.    Dr Aureliano Mole   [facility] For Inpatient follow-up with [facility] physician please call [facility] at [phone]. As we are not an outpatient service for any post hospital discharge needs please contact the hospital for assistance. If you have any questions for the [facility] physicians  or need to reconsult for clinical or diagnostic changes please contact us  via [facility] at [phone].  Non-radiologist review of imaging performed to assist with emergent clinical decision-making. Remote physician workstations do not possess the same resolution, calibration, or diagnostic capabilities as hospital-based radiology reading stations, and formal radiologist read is necessary.   Signature : Aureliano Mole

## 2024-06-18 NOTE — ED Notes (Signed)
 Patient transported to CT

## 2024-06-18 NOTE — ED Triage Notes (Signed)
 Pt to ED from home with husband c/o gait problems starting at 1815 tonight.  Pt states was at spin class at 1730,  was putting her bike up when she started leaning to the left and assisted herself to the floor before she fell.  States vomiting x4.  States eating and drinking normally prior.  Pt A&Ox4, sensation intact bilaterally, strength equal and bilateral x4, face symmetrical, speech normal, no ataxia present.  Pt pupils different, left larger than right, but states that's normal for her d/t right eye blindness.

## 2024-06-18 NOTE — ED Provider Notes (Signed)
 [facility] EMERGENCY DEPARTMENT AT [facility] Provider Note   CSN: [id number] Arrival date & time: 06/18/24  8069     Patient presents with: Gait Problem and Vomiting   Cynthia [name] is a 58 y.o. female.   58 year old female presenting emergency department with balance disturbance.  Symptoms started roughly 1815 this evening after she had a bicycle spin class at the gym.  After she had finished, she noted she felt like she was leaning over and could not keep herself upright.  No headache, vision loss, facial droop aphasia or weakness.  She lied down, when she got up had some nausea vomiting, and balance issues persisted.        Prior to Admission medications   Medication Sig Start Date End Date Taking? Authorizing Provider  Cetirizine HCl (ZYRTEC PO) Take by mouth.    [provider]  Cholecalciferol  (VITAMIN D ) 50 MCG (2000 UT) tablet Take 1 tablet (2,000 Units total) by mouth daily. 07/30/23   Early, Sara E, NP  doravirine  (PIFELTRO ) 100 MG TABS tablet Take 1 tablet (100 mg total) by mouth daily. 04/30/24   Luiz Channel, MD  emtricitabine -tenofovir  AF (DESCOVY ) 200-25 MG tablet Take 1 tablet by mouth daily. 04/30/24   Luiz Channel, MD  lisinopril  (ZESTRIL ) 10 MG tablet Take 1 tablet (10 mg total) by mouth daily. 07/30/23   Early, Sara E, NP  Multiple Vitamin (MULTIVITAMIN PO) Take by mouth.    [provider]  omeprazole  (PRILOSEC) 40 MG capsule Take 1 capsule (40 mg total) by mouth daily. 04/15/24   Early, Sara E, NP  psyllium (METAMUCIL) 58.6 % packet Take 1 packet by mouth daily.    [provider]  rosuvastatin  (CRESTOR ) 10 MG tablet Take 1 tablet (10 mg total) by mouth daily. 07/30/23   Early, Sara E, NP    Allergies: Dapsone, Amoxicillin, and Nitrofurantoin     Review of Systems  Updated Vital Signs BP 110/80   Pulse 73   Temp (!) 97.2 F (36.2 C)   Resp 16   Ht 5' 5 (1.651 m)   Wt 68 kg   LMP 05/07/2019   SpO2 94%   BMI  24.96 kg/m   Physical Exam Vitals and nursing note reviewed.  Constitutional:      General: She is not in acute distress.    Appearance: She is not toxic-appearing.  HENT:     Head: Normocephalic.     Nose: Nose normal.     Mouth/Throat:     Mouth: Mucous membranes are moist.  Eyes:     Conjunctiva/sclera: Conjunctivae normal.  Cardiovascular:     Rate and Rhythm: Normal rate and regular rhythm.  Pulmonary:     Effort: Pulmonary effort is normal.     Breath sounds: Normal breath sounds.  Abdominal:     General: Abdomen is flat. There is no distension.     Palpations: Abdomen is soft.     Tenderness: There is no abdominal tenderness. There is no guarding or rebound.  Musculoskeletal:        General: Normal range of motion.  Skin:    General: Skin is warm.     Capillary Refill: Capillary refill takes less than 2 seconds.  Neurological:     General: No focal deficit present.     Mental Status: She is alert and oriented to person, place, and time.     Cranial Nerves: No cranial nerve deficit.     Sensory: No sensory deficit.  Motor: No weakness.     Coordination: Coordination normal.  Psychiatric:        Mood and Affect: Mood normal.        Behavior: Behavior normal.     (all labs ordered are listed, but only abnormal results are displayed) Labs Reviewed  COMPREHENSIVE METABOLIC PANEL WITH GFR - Abnormal; Notable for the following components:      Result Value   Glucose, Bld 113 (*)    BUN 28 (*)    Creatinine, Ser 1.15 (*)    GFR, Estimated 56 (*)    All other components within normal limits  CBG MONITORING, ED - Abnormal; Notable for the following components:   Glucose-Capillary 109 (*)    All other components within normal limits  PROTIME-INR  APTT  CBC  DIFFERENTIAL  ETHANOL  CBG MONITORING, ED    EKG: None  Radiology: CT HEAD CODE STROKE WO CONTRAST Result Date: 06/18/2024 CLINICAL DATA:  Code stroke. Initial evaluation for acute neuro deficit,  stroke suspected. EXAM: CT HEAD WITHOUT CONTRAST TECHNIQUE: Contiguous axial images were obtained from the base of the skull through the vertex without intravenous contrast. RADIATION DOSE REDUCTION: This exam was performed according to the departmental dose-optimization program which includes automated exposure control, adjustment of the mA and/or kV according to patient size and/or use of iterative reconstruction technique. COMPARISON:  None Available. FINDINGS: Brain: Cerebral volume within normal limits. No acute intracranial hemorrhage. No acute large vessel territory infarct. Small remote lacunar infarct at the right lentiform nucleus. No mass lesion or midline shift. No hydrocephalus or extra-axial fluid collection. Vascular: No abnormal hyperdense vessel. Skull: Scalp soft tissues within normal limits.  Calvarium intact. Sinuses/Orbits: Postoperative changes noted at the right globe. Globes orbital soft tissues demonstrate no acute finding. Changes of chronic left maxillary sinusitis noted. Paranasal sinuses are otherwise clear. No mastoid effusion. Other: None. ASPECTS [facility] Stroke Program Early CT Score) - Ganglionic level infarction (caudate, lentiform nuclei, internal capsule, insula, M1-M3 cortex): 7 - Supraganglionic infarction (M4-M6 cortex): 3 Total score (0-10 with 10 being normal): 10 IMPRESSION: 1. No acute intracranial abnormality. 2. ASPECTS is 10. 3. Chronic lacunar infarct at the right lentiform nucleus. Results were called by telephone at the time of interpretation on 06/18/2024 at 8:30 pm to provider [facility] , who verbally acknowledged these results. Electronically Signed   By: Morene Hoard M.D.   On: 06/18/2024 20:31     Procedures   Medications Ordered in the ED  sodium chloride  flush (NS) 0.9 % injection 3 mL (3 mLs Intravenous Given 06/18/24 2045)    Clinical Course as of 06/18/24 2305  Thu Jun 18, 2024  2029 CT HEAD CODE STROKE WO CONTRAST Ct head negative.   [TY]  2045 Neurology recommendations:Recommendations:         Stroke/Telemetry Floor       Neuro Checks (Q4)       Bedside Swallow Eval       DVT Prophylaxis       IV Fluids, Normal Saline       Head of Bed 30 Degrees       Euglycemia and Avoid Hyperthermia (PRN Acetaminophen )       Initiate or continue Aspirin 81 MG daily       Antihypertensives PRN if Blood pressure is greater than 220/120 or there is a concern for End organ damage/contraindications for permissive HTN. If blood pressure is greater than 220/120 give labetalol PO or IV or Vasotec IV with a  goal of 15% reduction in BP during the first 24 hours.       MRI brain without contrast.       MRA head and neck.       TTE with bubble, lipids, HbA1c.       PT/OT  [TY]    Clinical Course User Index [TY] Neysa Caron PARAS, DO                                 Medical Decision Making This is a 58 year old female presenting emergency department with abnormal gait/balance disturbance.  Afebrile nontachycardic, hemodynamically stable.  Maintaining oxygen saturation on room air.  Was activated as a code stroke by triage staff.  CT head negative for acute pathology.  Symptoms seemingly improving and neurology not recommending tPA given rapid improvement of symptoms.  They are recommending admission for further stroke workup.   Amount and/or Complexity of Data Reviewed Independent Historian:     Details: Husband notes was leaning over quite a bit, but no other aphasia or dysarthria External Data Reviewed:     Details: Not on a blood thinner.  Does have a history of HIV, hypertension Labs: ordered. Decision-making details documented in ED Course.    Details: No leukocytosis to suggest stomach infection.  No anemia.  Baseline renal function Radiology: ordered and independent interpretation performed. Decision-making details documented in ED Course.    Details: Do not appreciate intracranial hemorrhage on her CT  head ECG/medicine tests: independent interpretation performed.    Details: Scheming changes. Discussion of management or test interpretation with external provider(s): Discussed with neurology; see ED course.  Risk Decision regarding hospitalization. Diagnosis or treatment significantly limited by social determinants of health.       Final diagnoses:  Stroke-like symptoms    ED Discharge Orders     None          Neysa Caron PARAS, DO 06/18/24 2305

## 2024-06-19 ENCOUNTER — Observation Stay (HOSPITAL_COMMUNITY)

## 2024-06-19 ENCOUNTER — Encounter (HOSPITAL_COMMUNITY): Payer: Self-pay | Admitting: Family Medicine

## 2024-06-19 DIAGNOSIS — R42 Dizziness and giddiness: Secondary | ICD-10-CM

## 2024-06-19 DIAGNOSIS — I639 Cerebral infarction, unspecified: Secondary | ICD-10-CM

## 2024-06-19 DIAGNOSIS — R2990 Unspecified symptoms and signs involving the nervous system: Secondary | ICD-10-CM | POA: Diagnosis present

## 2024-06-19 DIAGNOSIS — Z21 Asymptomatic human immunodeficiency virus [HIV] infection status: Secondary | ICD-10-CM | POA: Diagnosis not present

## 2024-06-19 DIAGNOSIS — E782 Mixed hyperlipidemia: Secondary | ICD-10-CM | POA: Diagnosis not present

## 2024-06-19 DIAGNOSIS — R2689 Other abnormalities of gait and mobility: Secondary | ICD-10-CM | POA: Diagnosis present

## 2024-06-19 DIAGNOSIS — E785 Hyperlipidemia, unspecified: Secondary | ICD-10-CM | POA: Diagnosis not present

## 2024-06-19 DIAGNOSIS — Z743 Need for continuous supervision: Secondary | ICD-10-CM | POA: Diagnosis not present

## 2024-06-19 DIAGNOSIS — I6381 Other cerebral infarction due to occlusion or stenosis of small artery: Secondary | ICD-10-CM

## 2024-06-19 DIAGNOSIS — I739 Peripheral vascular disease, unspecified: Secondary | ICD-10-CM

## 2024-06-19 DIAGNOSIS — B20 Human immunodeficiency virus [HIV] disease: Secondary | ICD-10-CM

## 2024-06-19 DIAGNOSIS — I10 Essential (primary) hypertension: Secondary | ICD-10-CM | POA: Diagnosis not present

## 2024-06-19 DIAGNOSIS — Z8679 Personal history of other diseases of the circulatory system: Secondary | ICD-10-CM

## 2024-06-19 DIAGNOSIS — I959 Hypotension, unspecified: Secondary | ICD-10-CM | POA: Diagnosis not present

## 2024-06-19 DIAGNOSIS — Z0389 Encounter for observation for other suspected diseases and conditions ruled out: Secondary | ICD-10-CM | POA: Diagnosis not present

## 2024-06-19 HISTORY — DX: Personal history of other diseases of the circulatory system: Z86.79

## 2024-06-19 LAB — COMPREHENSIVE METABOLIC PANEL WITH GFR
ALT: 18 U/L (ref 0–44)
AST: 20 U/L (ref 15–41)
Albumin: 3.7 g/dL (ref 3.5–5.0)
Alkaline Phosphatase: 47 U/L (ref 38–126)
Anion gap: 8 (ref 5–15)
BUN: 22 mg/dL — ABNORMAL HIGH (ref 6–20)
CO2: 24 mmol/L (ref 22–32)
Calcium: 9.4 mg/dL (ref 8.9–10.3)
Chloride: 106 mmol/L (ref 98–111)
Creatinine, Ser: 0.98 mg/dL (ref 0.44–1.00)
GFR, Estimated: >60 mL/min (ref >60)
Glucose, Bld: 106 mg/dL — ABNORMAL HIGH (ref 70–99)
Potassium: 3.9 mmol/L (ref 3.5–5.1)
Sodium: 138 mmol/L (ref 135–145)
Total Bilirubin: 0.5 mg/dL (ref 0.0–1.2)
Total Protein: 6.7 g/dL (ref 6.5–8.1)

## 2024-06-19 LAB — CBC WITH DIFFERENTIAL/PLATELET
Abs Immature Granulocytes: 0.01 K/uL (ref 0.00–0.07)
Basophils Absolute: 0 K/uL (ref 0.0–0.1)
Basophils Relative: 0 %
Eosinophils Absolute: 0.1 K/uL (ref 0.0–0.5)
Eosinophils Relative: 2 %
HCT: 35.7 % — ABNORMAL LOW (ref 36.0–46.0)
Hemoglobin: 12.5 g/dL (ref 12.0–15.0)
Immature Granulocytes: 0 %
Lymphocytes Relative: 39 %
Lymphs Abs: 2.3 K/uL (ref 0.7–4.0)
MCH: 32.1 pg (ref 26.0–34.0)
MCHC: 35 g/dL (ref 30.0–36.0)
MCV: 91.8 fL (ref 80.0–100.0)
Monocytes Absolute: 0.3 K/uL (ref 0.1–1.0)
Monocytes Relative: 6 %
Neutro Abs: 3.1 K/uL (ref 1.7–7.7)
Neutrophils Relative %: 53 %
Platelets: 190 K/uL (ref 150–400)
RBC: 3.89 MIL/uL (ref 3.87–5.11)
RDW: 12.3 % (ref 11.5–15.5)
WBC: 5.9 K/uL (ref 4.0–10.5)
nRBC: 0 % (ref 0.0–0.2)

## 2024-06-19 LAB — RAPID URINE DRUG SCREEN, HOSP PERFORMED
Amphetamines: NOT DETECTED
Barbiturates: NOT DETECTED
Benzodiazepines: NOT DETECTED
Cocaine: NOT DETECTED
Opiates: NOT DETECTED
Tetrahydrocannabinol: NOT DETECTED

## 2024-06-19 LAB — MAGNESIUM: Magnesium: 1.9 mg/dL (ref 1.7–2.4)

## 2024-06-19 MED ORDER — ASPIRIN 81 MG PO TBEC
81.0000 mg | DELAYED_RELEASE_TABLET | Freq: Every day | ORAL | Status: DC
  Administered 2024-06-20: 81 mg via ORAL
  Filled 2024-06-19: qty 1

## 2024-06-19 MED ORDER — IOHEXOL 350 MG/ML SOLN
75.0000 mL | Freq: Once | INTRAVENOUS | Status: AC | PRN
  Administered 2024-06-19: 75 mL via INTRAVENOUS

## 2024-06-19 MED ORDER — ONDANSETRON HCL 4 MG/2ML IJ SOLN: 4.0000 mg | Freq: Four times a day (QID) | INTRAMUSCULAR | Status: DC | PRN

## 2024-06-19 MED ORDER — DORAVIRINE 100 MG PO TABS
100.0000 mg | ORAL_TABLET | Freq: Every day | ORAL | Status: DC
  Administered 2024-06-19 – 2024-06-20 (×2): 100 mg via ORAL
  Filled 2024-06-19 (×2): qty 1

## 2024-06-19 MED ORDER — MELATONIN 3 MG PO TABS: 3.0000 mg | ORAL_TABLET | Freq: Every evening | ORAL | Status: DC | PRN

## 2024-06-19 MED ORDER — ASPIRIN 81 MG PO TBEC: 81.0000 mg | DELAYED_RELEASE_TABLET | Freq: Every day | ORAL | Status: DC

## 2024-06-19 MED ORDER — MECLIZINE HCL 25 MG PO TABS: 25.0000 mg | ORAL_TABLET | Freq: Three times a day (TID) | ORAL | Status: DC | PRN

## 2024-06-19 MED ORDER — EMTRICITABINE-TENOFOVIR AF 200-25 MG PO TABS
1.0000 | ORAL_TABLET | Freq: Every day | ORAL | Status: DC
  Administered 2024-06-19 – 2024-06-20 (×2): 1 via ORAL
  Filled 2024-06-19 (×2): qty 1

## 2024-06-19 MED ORDER — CLOPIDOGREL BISULFATE 75 MG PO TABS
300.0000 mg | ORAL_TABLET | Freq: Once | ORAL | Status: AC
  Administered 2024-06-19: 300 mg via ORAL
  Filled 2024-06-19: qty 4

## 2024-06-19 MED ORDER — CLOPIDOGREL BISULFATE 75 MG PO TABS
75.0000 mg | ORAL_TABLET | Freq: Every day | ORAL | Status: DC
  Administered 2024-06-20: 75 mg via ORAL
  Filled 2024-06-19: qty 1

## 2024-06-19 MED ORDER — ROSUVASTATIN CALCIUM 5 MG PO TABS
10.0000 mg | ORAL_TABLET | Freq: Every day | ORAL | Status: DC
Start: 2024-06-19 — End: 2024-06-20
  Administered 2024-06-19 – 2024-06-20 (×2): 10 mg via ORAL
  Filled 2024-06-19 (×2): qty 2

## 2024-06-19 MED ORDER — ACETAMINOPHEN 325 MG PO TABS: 650.0000 mg | ORAL_TABLET | Freq: Four times a day (QID) | ORAL | Status: DC | PRN

## 2024-06-19 MED ORDER — STROKE: EARLY STAGES OF RECOVERY BOOK: Freq: Once | Status: AC

## 2024-06-19 MED ORDER — CLOPIDOGREL BISULFATE 75 MG PO TABS: 75.0000 mg | ORAL_TABLET | Freq: Every day | ORAL | Status: DC

## 2024-06-19 MED ORDER — ACETAMINOPHEN 650 MG RE SUPP: 650.0000 mg | Freq: Four times a day (QID) | RECTAL | Status: DC | PRN

## 2024-06-19 MED ORDER — ASPIRIN 325 MG PO TBEC
325.0000 mg | DELAYED_RELEASE_TABLET | Freq: Once | ORAL | Status: AC
  Administered 2024-06-19: 325 mg via ORAL
  Filled 2024-06-19: qty 1

## 2024-06-19 NOTE — Progress Notes (Addendum)
  PROGRESS NOTE    SYRA SIRMONS  FMW:[id number] DOB: October 02, 1965 DOA: 06/18/2024 PCP: Oris Camie BRAVO, NP   Same day admission note:  58 y.o. female with medical history significant for HIV, essential hypertension, hyperlipidemia, who is admitted to [facility] on 06/18/2024 by way of transfer from [facility] with disequilibrium. CT head didn't show any bleed. Neurology consulted MRI showed evidence of acute ischemic infarct involving cerebellar vermis. CTA head and neck has been ordered. Continue with DAPT (aspirin and plavix) along with statin Rest of the management as per Neurology. PT OT consulted. F/u TTE    Deliliah Room, MD [facility]  If 7PM-7AM, please contact night-coverage  06/19/2024, 1:00 PM

## 2024-06-19 NOTE — Progress Notes (Signed)
 PT Cancellation Note  Patient Details Name: Cynthia [name] MRN: [id number] DOB: 11-28-1965   Cancelled Treatment:    Reason Eval/Treat Not Completed: Patient at procedure or test/unavailable (Pt not in room. Will return as able.)   Stephane JULIANNA Bevel 06/19/2024, 2:05 PM Bradford Cazier M,PT Acute Rehab Services [phone]

## 2024-06-19 NOTE — TOC Initial Note (Signed)
 Transition of Care [facility]) - Initial/Assessment Note    Patient Details  Name: Cynthia [name] MRN: [id number] Date of Birth: Oct 13, 1965  [facility] CM/SW Contact:    Landry DELENA Senters, RN Phone Number: 06/19/2024, 11:12 AM  Clinical Narrative:                 Chief Complaint: imbalance   Patient lives at home with her husband, who is able to provide transportation home and to appts. Patient has PCP, reports no problems with medications, does not have any equipment at home. Currently waiting for PT to see patient.   CM will continue to follow.   Expected Discharge Plan: Home/Self Care Barriers to Discharge: Continued Medical Work up   Patient Goals and CMS Choice            Expected Discharge Plan and Services                                              Prior Living Arrangements/Services   Lives with:: Self, Spouse Patient language and need for interpreter reviewed:: Yes Do you feel safe going back to the place where you live?: Yes      Need for Family Participation in Patient Care: No (Comment) Care giver support system in place?: Yes (comment)   Criminal Activity/Legal Involvement Pertinent to Current Situation/Hospitalization: No - Comment as needed  Activities of Daily Living   ADL Screening (condition at time of admission) Independently performs ADLs?: Yes (appropriate for developmental age) Is the patient deaf or have difficulty hearing?: No Does the patient have difficulty seeing, even when wearing glasses/contacts?: Yes Does the patient have difficulty concentrating, remembering, or making decisions?: No  Permission Sought/Granted                  Emotional Assessment Appearance:: Developmentally appropriate Attitude/Demeanor/Rapport: Engaged Affect (typically observed): Calm Orientation: : Oriented to Self, Oriented to Place, Oriented to  Time, Oriented to Situation Alcohol / Substance Use: Not Applicable Psych Involvement:  No (comment)  Admission diagnosis:  Imbalance [R26.89] Stroke-like symptoms [R29.90] Patient Active Problem List   Diagnosis Date Noted   History of essential hypertension 06/19/2024   Disequilibrium 06/18/2024   Stress incontinence 07/30/2023   Encounter for annual physical exam 06/23/2021   Gastroesophageal reflux disease 06/23/2021   Weight gain 06/23/2021   HLD (hyperlipidemia) 06/23/2021   Vitamin D  deficiency 06/23/2021   CMV retinitis (HCC)    Hypertension    Presence of intraocular lens 08/23/2011   Retinal detachment of right eye with multiple breaks 08/23/2011   History of HIV infection (HCC) 11/07/2006   PCP:  Oris Camie BRAVO, NP Pharmacy:   [facility] [address] [place] [address] Phone: [phone] Fax: [phone]     Social Drivers of Health (SDOH) Social History: SDOH Screenings   Food Insecurity: No Food Insecurity (06/19/2024)  Housing: Low Risk  (06/19/2024)  Transportation Needs: No Transportation Needs (06/19/2024)  Utilities: Not At Risk (06/19/2024)  Alcohol Screen: Low Risk  (03/17/2024)  Depression (PHQ2-9): Low Risk  (04/30/2024)  Financial Resource Strain: Low Risk  (03/17/2024)  Physical Activity: Insufficiently Active (03/17/2024)  Social Connections: Moderately Integrated (03/17/2024)  Stress: Stress Concern Present (03/17/2024)  Tobacco Use: Medium Risk (06/19/2024)   SDOH Interventions:     Readmission Risk Interventions  No data to display

## 2024-06-19 NOTE — Progress Notes (Signed)
  Echocardiogram Unable to complete due to scheduling conflicts. Will try later today or first thing tomorrow Tinnie FORBES Gosling 06/19/2024, 3:06 PM

## 2024-06-19 NOTE — H&P (Signed)
 History and Physical      Cynthia [name] FMW:[id number] DOB: [birth year] DOA: 06/18/2024; DOS: 06/19/2024  PCP: Cynthia Camie BRAVO, Cynthia [name]  Patient coming from: home   I have personally briefly reviewed patient's old medical records in [facility]  Chief Complaint: imbalance  HPI: Cynthia [name] is a 58 y.o. female with medical history significant for HIV, essential hypertension, hyperlipidemia, who is admitted to [facility] on 06/18/2024 by way of transfer from [facility] with disequilibrium after presenting from home to the latter facility complaining of imbalance.   The patient reports that she completed a bicycle spin class earlier on 06/18/2024, after which she developed sensation of imbalance and feeling that she was falling to her left side.  Denies any acute focal weakness nor any acute focal numbness or paresthesias.  She reports that the sensation of imbalance worsens with movement of her head and improves when laying still.  It is associated with intermittent nausea.  Denies any associated headache, facial droop, expressive aphasia, dysarthria, dysphagia.  This has not resulted in any falls.  Denies any associated chest pain, shortness of breath, nor any recent syncope.   [facility] ED Course:  Vital signs in the ED were notable for the following: Afebrile; rates in the 70s to 80s; systolic blood pressures in the low 100s 120s.   Labs were notable for the following: CMP notable for sodium 138, creatinine 1.15, glucose 113.  CBC notable for white cell count 8500, hemoglobin 12.5  Per my interpretation, EKG in ED demonstrated the following: Sinus rhythm with heart rate 94, normal intervals, no evidence of T wave or ST changes, including no evidence of STEMI.  Imaging in the ED, per corresponding formal radiology read, was notable for the following: CT head showed no evidence of acute intracranial process, Cleen evidence of acute infarct or intracranial hemorrhage.  EDP discussed  with on-call telestroke neurologist, who recommended MRI brain to further rule out acute ischemic stroke.   While in the ED, the following were administered: No IV fluids or medications were administered while at [facility] this evening.  Subsequently, the patient was admitted to [facility] for further evaluation management of presenting disequilibrium.     Review of Systems: As per HPI otherwise 10 point review of systems negative.   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HCC)    COVID-19    DISEASE, HYPERTENSIVE HEART, BENIGN, W/HF 11/07/2006   Qualifier: Diagnosis of  By: Cynthia [name], Cynthia [name]     GERD (gastroesophageal reflux disease)    HIV (human immunodeficiency virus infection) (HCC)    HIV infection (HCC)    Hyperlipidemia    Hypertension     Past Surgical History:  Procedure Laterality Date   EYE SURGERY     Six surgeries   FINGER SURGERY  05/2010   SCREWS IN AND OUT OF FINGER   LASER VAPORIZATION OF OF CONDYLOMA     MANDIBLE SURGERY     NOSE SURGERY      Social History:  reports that she has quit smoking. She has never used smokeless tobacco. She reports current alcohol use. She reports that she does not use drugs.   Allergies  Allergen Reactions   Dapsone Shortness Of Breath   Amoxicillin Rash   Nitrofurantoin  Nausea And Vomiting and Rash    Family History  Problem Relation Age of Onset   Hypertension Mother    Diabetes Father    Hypertension Father  Colon polyps Father    Cancer Maternal Grandmother        COLON   Heart disease Maternal Grandmother    Colon cancer Maternal Grandmother    Breast cancer Paternal Grandmother    Diabetes Paternal Grandmother    Colon cancer Cousin    Esophageal cancer Neg Hx    Stomach cancer Neg Hx    Rectal cancer Neg Hx     Family history reviewed and not pertinent    Prior to Admission medications   Medication Sig Start Date End Date Taking? Authorizing Provider   Cetirizine HCl (ZYRTEC PO) Take by mouth.    Provider, Historical, Cynthia [name]  Cholecalciferol  (VITAMIN D ) 50 MCG (2000 UT) tablet Take 1 tablet (2,000 Units total) by mouth daily. 07/30/23   Cynthia [name], Cynthia [name], Cynthia [name]  doravirine  (PIFELTRO ) 100 MG TABS tablet Take 1 tablet (100 mg total) by mouth daily. 04/30/24   Cynthia Channel, Cynthia [name]  emtricitabine -tenofovir  AF (DESCOVY ) 200-25 MG tablet Take 1 tablet by mouth daily. 04/30/24   Cynthia Channel, Cynthia [name]  lisinopril  (ZESTRIL ) 10 MG tablet Take 1 tablet (10 mg total) by mouth daily. 07/30/23   Cynthia [name], Cynthia [name], Cynthia [name]  Multiple Vitamin (MULTIVITAMIN PO) Take by mouth.    Provider, Historical, Cynthia [name]  omeprazole  (PRILOSEC) 40 MG capsule Take 1 capsule (40 mg total) by mouth daily. 04/15/24   Cynthia [name], Cynthia [name], Cynthia [name]  psyllium (METAMUCIL) 58.6 % packet Take 1 packet by mouth daily.    Provider, Historical, Cynthia [name]  rosuvastatin  (CRESTOR ) 10 MG tablet Take 1 tablet (10 mg total) by mouth daily. 07/30/23   Cynthia Camie BRAVO, Cynthia [name]     Objective    Physical Exam: Vitals:   06/18/24 2300 06/18/24 2341 06/19/24 0000 06/19/24 0132  BP: 116/79  111/74 (!) 102/58  Pulse: 73  76 81  Resp: 15  15 18   Temp:  97.7 F (36.5 C)  97.8 F (36.6 C)  TempSrc:  Oral  Oral  SpO2: 93%  96% 98%  Weight:      Height:        General: appears to be stated age; alert, oriented Skin: warm, dry, no rash Head:  AT/[place] Mouth:  Oral mucosa membranes appear moist, normal dentition Neck: supple; trachea midline Heart:  RRR; did not appreciate any M/R/G Lungs: CTAB, did not appreciate any wheezes, rales, or rhonchi Abdomen: + BS; soft, ND, NT Vascular: 2+ pedal pulses b/l; 2+ radial pulses b/l Extremities: no peripheral edema, no muscle wasting      Labs on Admission: I have personally reviewed following labs and imaging studies  CBC: Recent Labs  Lab 06/18/24 2016  WBC 8.5  NEUTROABS 5.9  HGB 12.5  HCT 36.3  MCV 92.4  PLT 212   Basic Metabolic Panel: Recent Labs  Lab 06/18/24 2016  NA 138  K 4.2   CL 103  CO2 22  GLUCOSE 113*  BUN 28*  CREATININE 1.15*  CALCIUM  9.7   GFR: Estimated Creatinine Clearance: 49.2 mL/min (A) (by C-G formula based on SCr of 1.15 mg/dL (H)). Liver Function Tests: Recent Labs  Lab 06/18/24 2016  AST 22  ALT 19  ALKPHOS 61  BILITOT 0.3  PROT 7.3  ALBUMIN 4.6   No results for input(s): LIPASE, AMYLASE in the last 168 hours. No results for input(s): AMMONIA in the last 168 hours. Coagulation Profile: Recent Labs  Lab 06/18/24 2016  INR 0.9   Cardiac Enzymes: No results for input(s): CKTOTAL, CKMB, CKMBINDEX, TROPONINI in the last 168  hours. BNP (last 3 results) No results for input(s): PROBNP in the last 8760 hours. HbA1C: No results for input(s): HGBA1C in the last 72 hours. CBG: Recent Labs  Lab 06/18/24 1953  GLUCAP 109*   Lipid Profile: No results for input(s): CHOL, HDL, LDLCALC, TRIG, CHOLHDL, LDLDIRECT in the last 72 hours. Thyroid  Function Tests: No results for input(s): TSH, T4TOTAL, FREET4, T3FREE, THYROIDAB in the last 72 hours. Anemia Panel: No results for input(s): VITAMINB12, FOLATE, FERRITIN, TIBC, IRON, RETICCTPCT in the last 72 hours. Urine analysis:    Component Value Date/Time   COLORURINE YELLOW 02/13/2021 0537   APPEARANCEUR CLEAR 02/13/2021 0537   LABSPEC 1.019 02/13/2021 0537   PHURINE 6.0 02/13/2021 0537   GLUCOSEU NEGATIVE 02/13/2021 0537   HGBUR MODERATE (A) 02/13/2021 0537   BILIRUBINUR small (A) 11/07/2021 0941   KETONESUR moderate (40) (A) 11/07/2021 0941   KETONESUR 15 (A) 02/13/2021 0537   PROTEINUR =100 (A) 11/07/2021 0941   PROTEINUR 100 (A) 02/13/2021 0537   UROBILINOGEN 0.2 11/07/2021 0941   UROBILINOGEN 0.2 09/07/2014 1012   NITRITE Negative 11/07/2021 0941   NITRITE NEGATIVE 02/13/2021 0537   LEUKOCYTESUR Trace (A) 11/07/2021 0941   LEUKOCYTESUR SMALL (A) 02/13/2021 0537    Radiological Exams on Admission: CT HEAD CODE STROKE WO  CONTRAST Result Date: 06/18/2024 CLINICAL DATA:  Code stroke. Initial evaluation for acute neuro deficit, stroke suspected. EXAM: CT HEAD WITHOUT CONTRAST TECHNIQUE: Contiguous axial images were obtained from the base of the skull through the vertex without intravenous contrast. RADIATION DOSE REDUCTION: This exam was performed according to the departmental dose-optimization program which includes automated exposure control, adjustment of the mA and/or kV according to patient size and/or use of iterative reconstruction technique. COMPARISON:  None Available. FINDINGS: Brain: Cerebral volume within normal limits. No acute intracranial hemorrhage. No acute large vessel territory infarct. Small remote lacunar infarct at the right lentiform nucleus. No mass lesion or midline shift. No hydrocephalus or extra-axial fluid collection. Vascular: No abnormal hyperdense vessel. Skull: Scalp soft tissues within normal limits.  Calvarium intact. Sinuses/Orbits: Postoperative changes noted at the right globe. Globes orbital soft tissues demonstrate no acute finding. Changes of chronic left maxillary sinusitis noted. Paranasal sinuses are otherwise clear. No mastoid effusion. Other: None. ASPECTS [facility] Stroke Program Cynthia [name] CT Score) - Ganglionic level infarction (caudate, lentiform nuclei, internal capsule, insula, M1-M3 cortex): 7 - Supraganglionic infarction (M4-M6 cortex): 3 Total score (0-10 with 10 being normal): 10 IMPRESSION: 1. No acute intracranial abnormality. 2. ASPECTS is 10. 3. Chronic lacunar infarct at the right lentiform nucleus. Results were called by telephone at the time of interpretation on 06/18/2024 at 8:30 pm to provider [facility] , who verbally acknowledged these results. Electronically Signed   By: Morene Hoard M.D.   On: 06/18/2024 20:31      Assessment/Plan   Principal Problem:   Disequilibrium Active Problems:   History of HIV infection (HCC)   HLD (hyperlipidemia)   History  of essential hypertension     #) Disequilibrium: New onset imbalance, with the patient conveying associated sensation of leaning to the left, with imbalance prompted by positional changes of the head, improving with laying still.  By this description, differential includes benign positional vertigo.  CT head showed no evidence of acute intracranial process, including evidence of acute infarct.  Telestroke neurology recommended MRI brain to further rule out contributory acute ischemic stroke.  Will attempt to pursue MRI brain, although there may be incompatibility issues given patient's report of metal in  her mandible as a component of prior jaw surgery.  No evidence of vertical nystagmus to suggest acute stroke.  Plan: Will attempt MRI brain, as above.  Check orthostatic vital signs x 1 occurrence.  Fall cautions ordered.  I have placed consult with vestibular PT.  Monitor strict I's and O's and daily weights.  Every 4 hours neurochecks. Prn meclizine. If MRI positive, will then complete stroke order set, including evaluation with modifiable ischemic CVA risk factors, and would also consult neurology at that time.                     #) HIV: Documented history of such, on antiretroviral therapy, with most recent CD4 count noted to be 926 in September 2025, will viral load at that time was reported to be undetectable.  Plan: Resume outpatient dor of URI and as well as disco V.                      #) Essential Hypertension: documented h/o such, with outpatient antihypertensive regimen including lisinopril .  SBP's in the ED today: Low 100s to 120s mmHg.   Plan: Close monitoring of subsequent BP via routine VS. We will hold home lisinopril  for now pending MRI brain, as above.                         #) Hyperlipidemia: documented h/o such. On rosuvastatin  as outpatient.   Plan: continue home statin.       DVT prophylaxis: SCD's    Code Status: Full code Family Communication: none Disposition Plan: Per Rounding Team Consults called: none;  Admission status: obs     I SPENT GREATER THAN 75  MINUTES IN CLINICAL CARE TIME/MEDICAL DECISION-MAKING IN COMPLETING THIS ADMISSION.      Cynthia [name] [facility]  From 7PM - 7AM   06/19/2024, 1:58 AM

## 2024-06-19 NOTE — Consult Note (Signed)
 Stroke Neurology Consultation Note  Consult Requested by: Dr. Dino  Reason for Consult: stroke  Consult Date: 06/19/24   The history was obtained from the pt.  During history and examination, all items were able to obtain unless otherwise noted.  History of Present Illness:  Cynthia [name] is a 58 y.o. 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  Per pt, she was in a cycling class yesterday. After the class, she went to bathroom and when she was out of the bathroom planning for more workout and started to have difficulty walking, felt dizzy with room spinning. She had to slowly lower herself down to the floor. She had several episodes of nausea and vomiting. She went to ED for evaluation. Symptoms gradually improving and she felt much better today. CT no acute finding. And she was transferred to [facility] for further work up. Today MRI done showed small infarct at cerebellar vermis.   Pt denies any head or neck trauma, denies any abrupt head or neck movement/ exercise. Pt did have weight lifting as regular work up but denies any neck pain or HA. She stated that her BP and cholesterol were well controlled.   LSN: [id number] yesterday TNK Given: No: outside window IR Thrombectomy? No, mild symptoms, low NIHSS Modified Rankin Scale: 0-Completely asymptomatic and back to baseline post- stroke  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HCC)    COVID-19    DISEASE, HYPERTENSIVE HEART, BENIGN, W/HF 11/07/2006   Qualifier: Diagnosis of  By: Raymond RN, Delene     GERD (gastroesophageal reflux disease)    HIV (human immunodeficiency virus infection) (HCC)    HIV infection (HCC)    Hyperlipidemia    Hypertension     Past Surgical History:  Procedure Laterality Date   EYE SURGERY     Six surgeries   FINGER SURGERY  05/06/2010   SCREWS IN AND OUT OF FINGER   LASER VAPORIZATION OF OF CONDYLOMA     MANDIBLE  SURGERY     NOSE SURGERY      Family History  Problem Relation Age of Onset   Hypertension Mother    Diabetes Father    Hypertension Father    Colon polyps Father    Cancer Maternal Grandmother        COLON   Heart disease Maternal Grandmother    Colon cancer Maternal Grandmother    Breast cancer Paternal Grandmother    Diabetes Paternal Grandmother    Colon cancer Cousin    Esophageal cancer Neg Hx    Stomach cancer Neg Hx    Rectal cancer Neg Hx     Social History:  reports that she has quit smoking. She has never used smokeless tobacco. She reports current alcohol use. She reports that she does not use drugs.  Allergies:  Allergies  Allergen Reactions   Dapsone Shortness Of Breath   Amoxicillin Rash   Nitrofurantoin  Nausea And Vomiting and Rash    No current facility-administered medications on file prior to encounter.   Current Outpatient Medications on File Prior to Encounter  Medication Sig Dispense Refill   Cetirizine HCl (ZYRTEC PO) Take by mouth.     Cholecalciferol  (VITAMIN D ) 50 MCG (2000 UT) tablet Take 1 tablet (2,000 Units total) by mouth daily. 90 tablet 3   doravirine  (PIFELTRO ) 100 MG TABS tablet Take 1 tablet (100 mg total) by mouth daily.  30 tablet 11   emtricitabine -tenofovir  AF (DESCOVY ) 200-25 MG tablet Take 1 tablet by mouth daily. 30 tablet 11   lisinopril  (ZESTRIL ) 10 MG tablet Take 1 tablet (10 mg total) by mouth daily. 90 tablet 3   Multiple Vitamin (MULTIVITAMIN PO) Take by mouth.     omeprazole  (PRILOSEC) 40 MG capsule Take 1 capsule (40 mg total) by mouth daily. 30 capsule 2   psyllium (METAMUCIL) 58.6 % packet Take 1 packet by mouth daily.     rosuvastatin  (CRESTOR ) 10 MG tablet Take 1 tablet (10 mg total) by mouth daily. 90 tablet 3    Review of Systems: A full ROS was attempted today and was able to be performed.  Systems assessed include - Constitutional, Eyes, HENT, Respiratory, Cardiovascular, Gastrointestinal, Genitourinary,  Integument/breast, Hematologic/lymphatic, Musculoskeletal, Neurological, Behavioral/Psych, Endocrine, Allergic/Immunologic - with pertinent responses as per HPI.  Physical Examination: Temp:  [97.2 F (36.2 C)-98.3 F (36.8 C)] 98.2 F (36.8 C) (11/14 1124) Pulse Rate:  [70-99] 82 (11/14 1124) Resp:  [13-20] 20 (11/14 1124) BP: (99-126)/(58-93) 102/66 (11/14 1124) SpO2:  [93 %-100 %] 96 % (11/14 1124) Weight:  [67.4 kg-68 kg] 67.4 kg (11/14 0500)  General - well nourished, well developed, in no apparent distress.    Ophthalmologic - fundi not visualized due to noncooperation.    Cardiovascular - regular rhythm and rate  Mental Status -  Level of arousal and orientation to time, place, and person were intact. Language including expression, naming, repetition, comprehension was assessed and found intact. Fund of Knowledge was assessed and was intact.  Cranial Nerves II - XII - II - Vision intact OS. Vision in OD diminished, a crescent sharp area in the right eye visual field can see shadows and light.  III, IV, VI - Extraocular movements intact except R eye resting position with mild outward deviation. V - Facial sensation intact bilaterally. VII - Facial movement intact bilaterally. VIII - Hearing & vestibular intact bilaterally. X - Palate elevates symmetrically. XI - Chin turning & shoulder shrug intact bilaterally. XII - Tongue protrusion intact.  Motor Strength - The patient's strength was normal in all extremities and pronator drift was absent.   Motor Tone & Bulk - Muscle tone was assessed at the neck and appendages and was normal.  Bulk was normal and fasciculations were absent.   Reflexes - The patient's reflexes were normal in all extremities and she had no pathological reflexes.  Sensory - Light touch, temperature/pinprick were assessed and were normal.    Coordination - The patient had normal movements in the hands and feet with no ataxia or dysmetria.  Tremor was  absent.  Gait and Station - deferred  Data Reviewed: MR BRAIN WO CONTRAST Addendum Date: 06/19/2024 ADDENDUM #1 ADDENDUM: On further review, there is a tiny focus of acute infarction in the cerebellar vermis on axial image 20 series 2 with matched hypointensity on the ADC. Findings were discussed with Dr. cindy by phone at 12:43 pm on 06/19/2024. ---------------------------------------------------- Electronically signed by: Ryan Chess MD 06/19/2024 12:43 PM EST RP Workstation: [id number]   Result Date: 06/19/2024 ORIGINAL REPORT EXAM: MRI BRAIN WITHOUT CONTRAST 06/19/2024 10:55:45 AM TECHNIQUE: Multiplanar multisequence MRI of the head/brain was performed without the administration of intravenous contrast. COMPARISON: CT head 06/18/2024. CLINICAL HISTORY: Stroke/TIA FINDINGS: BRAIN AND VENTRICLES: No acute infarct. No intracranial hemorrhage. No mass. No midline shift. No hydrocephalus. The sella is unremarkable. Normal flow voids. ORBITS: Prior right vitreoretinal surgery. SINUSES AND MASTOIDS: Left maxillary sinus atelectasis. BONES  AND SOFT TISSUES: Normal marrow signal. No acute soft tissue abnormality. IMPRESSION: 1. Normal brain MRI. Electronically signed by: Ryan Chess MD 06/19/2024 12:17 PM EST RP Workstation: [id number]   CT HEAD CODE STROKE WO CONTRAST Result Date: 06/18/2024 CLINICAL DATA:  Code stroke. Initial evaluation for acute neuro deficit, stroke suspected. EXAM: CT HEAD WITHOUT CONTRAST TECHNIQUE: Contiguous axial images were obtained from the base of the skull through the vertex without intravenous contrast. RADIATION DOSE REDUCTION: This exam was performed according to the departmental dose-optimization program which includes automated exposure control, adjustment of the mA and/or kV according to patient size and/or use of iterative reconstruction technique. COMPARISON:  None Available. FINDINGS: Brain: Cerebral volume within normal limits. No acute intracranial hemorrhage.  No acute large vessel territory infarct. Small remote lacunar infarct at the right lentiform nucleus. No mass lesion or midline shift. No hydrocephalus or extra-axial fluid collection. Vascular: No abnormal hyperdense vessel. Skull: Scalp soft tissues within normal limits.  Calvarium intact. Sinuses/Orbits: Postoperative changes noted at the right globe. Globes orbital soft tissues demonstrate no acute finding. Changes of chronic left maxillary sinusitis noted. Paranasal sinuses are otherwise clear. No mastoid effusion. Other: None. ASPECTS [facility] Stroke Program Early CT Score) - Ganglionic level infarction (caudate, lentiform nuclei, internal capsule, insula, M1-M3 cortex): 7 - Supraganglionic infarction (M4-M6 cortex): 3 Total score (0-10 with 10 being normal): 10 IMPRESSION: 1. No acute intracranial abnormality. 2. ASPECTS is 10. 3. Chronic lacunar infarct at the right lentiform nucleus. Results were called by telephone at the time of interpretation on 06/18/2024 at 8:30 pm to provider [facility] , who verbally acknowledged these results. Electronically Signed   By: Morene Hoard M.D.   On: 06/18/2024 20:31    Assessment: 58 y.o. 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 CT no acute finding. MRI done showed small infarct at cerebellar vermis. Stroke more consistent with small vessel disease, will continue stroke work up. Put on DAPT with loading and statin.   Stroke Risk Factors - hyperlipidemia, hypertension, and HIV  Plan: - HgbA1c, fasting lipid panel, UDS - CTA head and neck - PT consult, OT consult, Speech consult - Echocardiogram - Prophylactic therapy-Antiplatelet med: Aspirin - dose 81 and Antiplatelet med: Plavix - dose 75 - on statin - Risk factor modification - Telemetry monitoring - Frequent neuro checks - will follow  Thank you for this consultation and allowing us  to  participate in the care of this patient.  Ary Cummins, MD PhD Stroke Neurology 06/19/2024 4:46 PM

## 2024-06-19 NOTE — ED Notes (Signed)
[facility] arrived to transport patient.  

## 2024-06-20 ENCOUNTER — Other Ambulatory Visit (HOSPITAL_COMMUNITY): Payer: Self-pay

## 2024-06-20 ENCOUNTER — Other Ambulatory Visit: Payer: Self-pay | Admitting: Neurology

## 2024-06-20 ENCOUNTER — Observation Stay (HOSPITAL_COMMUNITY)

## 2024-06-20 DIAGNOSIS — I6381 Other cerebral infarction due to occlusion or stenosis of small artery: Secondary | ICD-10-CM | POA: Diagnosis not present

## 2024-06-20 DIAGNOSIS — E785 Hyperlipidemia, unspecified: Secondary | ICD-10-CM | POA: Diagnosis not present

## 2024-06-20 DIAGNOSIS — I6389 Other cerebral infarction: Secondary | ICD-10-CM | POA: Diagnosis not present

## 2024-06-20 DIAGNOSIS — E782 Mixed hyperlipidemia: Secondary | ICD-10-CM | POA: Diagnosis not present

## 2024-06-20 DIAGNOSIS — Z8679 Personal history of other diseases of the circulatory system: Secondary | ICD-10-CM | POA: Diagnosis not present

## 2024-06-20 DIAGNOSIS — Z21 Asymptomatic human immunodeficiency virus [HIV] infection status: Secondary | ICD-10-CM | POA: Diagnosis not present

## 2024-06-20 DIAGNOSIS — I639 Cerebral infarction, unspecified: Secondary | ICD-10-CM

## 2024-06-20 DIAGNOSIS — I739 Peripheral vascular disease, unspecified: Secondary | ICD-10-CM | POA: Diagnosis not present

## 2024-06-20 DIAGNOSIS — I10 Essential (primary) hypertension: Secondary | ICD-10-CM | POA: Diagnosis not present

## 2024-06-20 DIAGNOSIS — B20 Human immunodeficiency virus [HIV] disease: Secondary | ICD-10-CM | POA: Diagnosis not present

## 2024-06-20 LAB — LIPID PANEL
Cholesterol: 170 mg/dL (ref 0–200)
HDL: 48 mg/dL (ref >40)
LDL Cholesterol: 98 mg/dL (ref 0–99)
Total CHOL/HDL Ratio: 3.5 ratio
Triglycerides: 119 mg/dL (ref <150)
VLDL: 24 mg/dL (ref 0–40)

## 2024-06-20 LAB — TSH: TSH: 2.705 u[IU]/mL (ref 0.350–4.500)

## 2024-06-20 LAB — HEMOGLOBIN A1C
Hgb A1c MFr Bld: 5.1 % (ref 4.8–5.6)
Mean Plasma Glucose: 99.67 mg/dL

## 2024-06-20 LAB — ECHOCARDIOGRAM COMPLETE
Area-P 1/2: 4.24 cm2
Calc EF: 64.7 %
Height: 65 in
MV VTI: 1.99 cm2
S' Lateral: 2.8 cm
Single Plane A2C EF: 71.9 %
Single Plane A4C EF: 55.5 %
Weight: 2426.82 [oz_av]

## 2024-06-20 LAB — VITAMIN B12: Vitamin B-12: 328 pg/mL (ref 180–914)

## 2024-06-20 MED ORDER — ASPIRIN 81 MG PO TBEC
81.0000 mg | DELAYED_RELEASE_TABLET | Freq: Every day | ORAL | 12 refills | Status: DC
  Filled 2024-06-20: qty 30, 30d supply, fill #0

## 2024-06-20 MED ORDER — STROKE: EARLY STAGES OF RECOVERY BOOK
Status: AC
  Filled 2024-06-20: qty 1

## 2024-06-20 MED ORDER — ROSUVASTATIN CALCIUM 20 MG PO TABS: 20.0000 mg | ORAL_TABLET | Freq: Every day | ORAL | Status: DC

## 2024-06-20 MED ORDER — CLOPIDOGREL BISULFATE 75 MG PO TABS
75.0000 mg | ORAL_TABLET | Freq: Every day | ORAL | 0 refills | Status: AC
  Filled 2024-06-20: qty 20, 20d supply, fill #0

## 2024-06-20 MED ORDER — ROSUVASTATIN CALCIUM 20 MG PO TABS
20.0000 mg | ORAL_TABLET | Freq: Every day | ORAL | 3 refills | Status: DC
  Filled 2024-06-20: qty 90, 90d supply, fill #0

## 2024-06-20 NOTE — Plan of Care (Signed)
  Problem: Education: Goal: Knowledge of disease or condition will improve Outcome: Progressing   Problem: Coping: Goal: Will verbalize positive feelings about self Outcome: Progressing Goal: Will identify appropriate support needs Outcome: Progressing   Problem: Health Behavior/Discharge Planning: Goal: Ability to manage health-related needs will improve Outcome: Progressing Goal: Goals will be collaboratively established with patient/family Outcome: Progressing   Problem: Self-Care: Goal: Ability to communicate needs accurately will improve Outcome: Progressing   Problem: Nutrition: Goal: Risk of aspiration will decrease Outcome: Progressing

## 2024-06-20 NOTE — Evaluation (Signed)
 Physical Therapy Brief Evaluation and Discharge Note Patient Details Name: Cynthia [name] MRN: [id number] DOB: 1966/04/04 Today's Date: 06/20/2024   History of Present Illness  Pt is a 58 y.o. F who presents 06/18/2024 with dizziness, nausea, vomiting. MRI showed small infarct at cerebellar vermis. Significant PMH: HTN, HLD, HIV, R eye legally blind.  Clinical Impression  Patient evaluated by Physical Therapy with no further acute PT needs identified. PTA, pt works as an nature conservation officer for [employer] and is active; she reports she cycles 2x/wk and walks 2 miles 4x/wk. Pt reports resolution of symptoms. Pt with nystagmus with smooth pursuits to the left and normal saccades. Strength, coordination, sensation normal bilaterally. Pt ambulating 350 ft with no assistive device and negotiated stairs independently. Dizziness or nausea not reproduced during examination. Pt scoring 29/30 on the Functional Gait Assessment, indicating she is not at high risk for fall. Mild impairment noted in tandem stance and walking, which pt states is baseline. Education provided regarding AHA recommendations for activity. All education has been completed and the patient has no further questions. No follow-up Physical Therapy or equipment needs. PT is signing off. Thank you for this referral.      PT Assessment Patient does not need any further PT services  Assistance Needed at Discharge  None    Equipment Recommendations None recommended by PT  Recommendations for Other Services       Precautions/Restrictions Precautions Precautions: None Restrictions Weight Bearing Restrictions Per Provider Order: No        Mobility  Bed Mobility       General bed mobility comments: Sitting EOB upon arrival  Transfers Overall transfer level: Independent                      Ambulation/Gait Ambulation/Gait assistance: Independent Gait Distance (Feet): 350 Feet Assistive device: None Gait  Pattern/deviations: WFL(Within Functional Limits)      Home Activity Instructions    Stairs            Modified Rankin (Stroke Patients Only) Modified Rankin (Stroke Patients Only) Pre-Morbid Rankin Score: No symptoms Modified Rankin: No symptoms      Balance Overall balance assessment: Mild deficits observed, not formally tested               Standardized Balance Assessment Standardized Balance Assessment : Dynamic Gait Index Dynamic Gait Index Level Surface: Normal Change in Gait Speed: Normal Gait with Horizontal Head Turns: Normal Gait with Vertical Head Turns: Normal Gait and Pivot Turn: Normal Step Over Obstacle: Normal Step Around Obstacles: Normal Steps: Normal Total Score: 24      Pertinent Vitals/Pain PT - Brief Vital Signs All Vital Signs Stable: Yes Pain Assessment Pain Assessment: No/denies pain     Home Living Family/patient expects to be discharged to:: Private residence Living Arrangements: Spouse/significant other Available Help at Discharge: Family Home Environment: Stairs to enter  Landscape Architect of Steps: 4 Home Equipment: Crutches   Additional Comments: 2 dogs (large breed)    Prior Function Level of Independence: Independent Comments: Nature conservation officer for [employer]    UE/LE Assessment   UE ROM/Strength/Tone/Coordination: [employer] [facility]    LE ROM/Strength/Tone/Coordination: [facility]      Communication   Communication Communication: No apparent difficulties     Cognition Overall Cognitive Status: Appears within functional limits for tasks assessed/performed       General Comments      Exercises     Assessment/Plan    PT Problem List  PT Visit Diagnosis Unsteadiness on feet (R26.81)    No Skilled PT All education completed;Patient at baseline level of functioning;Patient is independent with all acitivity/mobility   Co-evaluation                AMPAC 6 Clicks Help needed turning from your back to your  side while in a flat bed without using bedrails?: None Help needed moving from lying on your back to sitting on the side of a flat bed without using bedrails?: None Help needed moving to and from a bed to a chair (including a wheelchair)?: None Help needed standing up from a chair using your arms (e.g., wheelchair or bedside chair)?: None Help needed to walk in hospital room?: None Help needed climbing 3-5 steps with a railing? : None 6 Click Score: 24      End of Session   Activity Tolerance: Patient tolerated treatment well Patient left: in bed;with call bell/phone within reach Nurse Communication: Mobility status PT Visit Diagnosis: Unsteadiness on feet (R26.81)     Time: 9149-9087 PT Time Calculation (min) (ACUTE ONLY): 22 min  Charges:   PT Evaluation $PT Eval Low Complexity: 1 Low      Aleck Daring, PT, DPT Acute Rehabilitation Services Office [phone]   Aleck ONEIDA Daring  06/20/2024, 9:20 AM

## 2024-06-20 NOTE — Progress Notes (Signed)
 OT Cancellation Note  Patient Details Name: Cynthia [name] MRN: [id number] DOB: 09-12-1965   Cancelled Treatment:    Reason Eval/Treat Not Completed: OT screened, no needs identified, will sign off (pt with no acute OT needs at this time, pt with baseline R visual deficits affecting depth perception, but no change from baseline per pt, pt has no concerns with ADL/iADL, will s/o. Please reconsult if there is  a change in pt status)  Amarii Bordas K, OTD, OTR/L SecureChat Preferred Acute Rehab [phone]   Auryn Paige K Koonce 06/20/2024, 11:01 AM

## 2024-06-20 NOTE — Progress Notes (Signed)
 Pt discharged, AVS instructions given, pt verbalized understanding,

## 2024-06-20 NOTE — Progress Notes (Signed)
 STROKE TEAM PROGRESS NOTE   SUBJECTIVE (INTERVAL HISTORY) NO family is at the bedside.  Overall her condition is completely resolved. She has no symptoms now and worked with PT well today. Echo unremarkable.    OBJECTIVE Temp:  [97.5 F (36.4 C)-98.9 F (37.2 C)] 98.9 F (37.2 C) (11/15 1129) Pulse Rate:  [69-84] 82 (11/15 1129) Cardiac Rhythm: Normal sinus rhythm (11/15 0700) Resp:  [17-18] 17 (11/15 1129) BP: (110-130)/(61-78) 115/73 (11/15 1129) SpO2:  [96 %-99 %] 96 % (11/15 1129) Weight:  [68.8 kg] 68.8 kg (11/15 0438)  Recent Labs  Lab 06/18/24 1953  GLUCAP 109*   Recent Labs  Lab 06/18/24 2016 06/19/24 0442  NA 138 138  K 4.2 3.9  CL 103 106  CO2 22 24  GLUCOSE 113* 106*  BUN 28* 22*  CREATININE 1.15* 0.98  CALCIUM  9.7 9.4  MG  --  1.9   Recent Labs  Lab 06/18/24 2016 06/19/24 0442  AST 22 20  ALT 19 18  ALKPHOS 61 47  BILITOT 0.3 0.5  PROT 7.3 6.7  ALBUMIN 4.6 3.7   Recent Labs  Lab 06/18/24 2016 06/19/24 0442  WBC 8.5 5.9  NEUTROABS 5.9 3.1  HGB 12.5 12.5  HCT 36.3 35.7*  MCV 92.4 91.8  PLT 212 190   No results for input(s): CKTOTAL, CKMB, CKMBINDEX, TROPONINI in the last 168 hours. Recent Labs    06/18/24 2016  LABPROT 12.7  INR 0.9   No results for input(s): COLORURINE, LABSPEC, PHURINE, GLUCOSEU, HGBUR, BILIRUBINUR, KETONESUR, PROTEINUR, UROBILINOGEN, NITRITE, LEUKOCYTESUR in the last 72 hours.  Invalid input(s): APPERANCEUR     Component Value Date/Time   CHOL 170 06/20/2024 0442   CHOL 156 07/30/2023 0947   TRIG 119 06/20/2024 0442   HDL 48 06/20/2024 0442   HDL 50 07/30/2023 0947   CHOLHDL 3.5 06/20/2024 0442   VLDL 24 06/20/2024 0442   LDLCALC 98 06/20/2024 0442   LDLCALC 91 07/30/2023 0947   LDLCALC 155 (H) 01/12/2020 0942   Lab Results  Component Value Date   HGBA1C 5.1 06/20/2024      Component Value Date/Time   LABOPIA NONE DETECTED 06/19/2024 2033   COCAINSCRNUR NONE DETECTED  06/19/2024 2033   LABBENZ NONE DETECTED 06/19/2024 2033   AMPHETMU NONE DETECTED 06/19/2024 2033   THCU NONE DETECTED 06/19/2024 2033   LABBARB NONE DETECTED 06/19/2024 2033    Recent Labs  Lab 06/18/24 2016  ETH <15    I have personally reviewed the radiological images below and agree with the radiology interpretations.  ECHOCARDIOGRAM COMPLETE Result Date: 06/20/2024    ECHOCARDIOGRAM REPORT   Patient Name:   Cynthia [name] Date of Exam: 06/20/2024 Medical Rec #:  [id number]     Height:       65.0 in Accession #:    [id number]    Weight:       151.7 lb Date of Birth:  1966-03-04      BSA:          1.759 m Patient Age:    58 years      BP:           110/78 mmHg Patient Gender: F             HR:           80 bpm. Exam Location:  Inpatient Procedure: 2D Echo (Both Spectral and Color Flow Doppler were utilized during            procedure).  Indications:   Stroke  History:       Patient has no prior history of Echocardiogram examinations.                Stroke.  Sonographer:   Norleen Amour Referring      ARY Dayshawn Irizarry Phys: IMPRESSIONS  1. Left ventricular ejection fraction, by estimation, is 60 to 65%. The left ventricle has normal function. The left ventricle has no regional wall motion abnormalities. Left ventricular diastolic parameters were normal.  2. Right ventricular systolic function is normal. The right ventricular size is normal. Tricuspid regurgitation signal is inadequate for assessing PA pressure.  3. The mitral valve is normal in structure. Trivial mitral valve regurgitation. No evidence of mitral stenosis.  4. The aortic valve is tricuspid. Aortic valve regurgitation is not visualized. No aortic stenosis is present.  5. The inferior vena cava is normal in size with greater than 50% respiratory variability, suggesting right atrial pressure of 3 mmHg. FINDINGS  Left Ventricle: Left ventricular ejection fraction, by estimation, is 60 to 65%. The left ventricle has normal function. The left  ventricle has no regional wall motion abnormalities. The left ventricular internal cavity size was normal in size. There is  no left ventricular hypertrophy. Left ventricular diastolic parameters were normal. Right Ventricle: The right ventricular size is normal. No increase in right ventricular wall thickness. Right ventricular systolic function is normal. Tricuspid regurgitation signal is inadequate for assessing PA pressure. Left Atrium: Left atrial size was normal in size. Right Atrium: Right atrial size was normal in size. Pericardium: There is no evidence of pericardial effusion. Mitral Valve: The mitral valve is normal in structure. Trivial mitral valve regurgitation. No evidence of mitral valve stenosis. MV peak gradient, 3.7 mmHg. The mean mitral valve gradient is 2.0 mmHg. Tricuspid Valve: The tricuspid valve is normal in structure. Tricuspid valve regurgitation is trivial. Aortic Valve: The aortic valve is tricuspid. Aortic valve regurgitation is not visualized. No aortic stenosis is present. Pulmonic Valve: The pulmonic valve was not well visualized. Pulmonic valve regurgitation is trivial. Aorta: The aortic root and ascending aorta are structurally normal, with no evidence of dilitation. Venous: The inferior vena cava is normal in size with greater than 50% respiratory variability, suggesting right atrial pressure of 3 mmHg. IAS/Shunts: The atrial septum is grossly normal.  LEFT VENTRICLE PLAX 2D LVIDd:         4.40 cm     Diastology LVIDs:         2.80 cm     LV e' medial:    9.46 cm/s LV PW:         0.80 cm     LV E/e' medial:  10.9 LV IVS:        0.70 cm     LV e' lateral:   8.92 cm/s LVOT diam:     2.00 cm     LV E/e' lateral: 11.5 LV SV:         57 LV SV Index:   32 LVOT Area:     3.14 cm  LV Volumes (MOD) LV vol d, MOD A2C: 52.7 ml LV vol d, MOD A4C: 70.6 ml LV vol s, MOD A2C: 14.8 ml LV vol s, MOD A4C: 31.4 ml LV SV MOD A2C:     37.9 ml LV SV MOD A4C:     70.6 ml LV SV MOD BP:      40.3 ml RIGHT  VENTRICLE  IVC RV Basal diam:  3.80 cm     IVC diam: 1.20 cm RV S prime:     13.50 cm/s TAPSE (M-mode): 2.3 cm      PULMONARY VEINS                             Diastolic Velocity: 32.20 cm/s                             S/D Velocity:       1.70                             Systolic Velocity:  55.40 cm/s LEFT ATRIUM             Index        RIGHT ATRIUM           Index LA diam:        3.50 cm 1.99 cm/m   RA Area:     12.90 cm LA Vol (A2C):   26.2 ml 14.90 ml/m  RA Volume:   31.70 ml  18.02 ml/m LA Vol (A4C):   25.2 ml 14.33 ml/m LA Biplane Vol: 26.1 ml 14.84 ml/m  AORTIC VALVE             PULMONIC VALVE LVOT Vmax:   93.40 cm/s  PV Vmax:       1.30 m/s LVOT Vmean:  65.100 cm/s PV Peak grad:  6.8 mmHg LVOT VTI:    0.181 m  AORTA Ao Root diam: 2.20 cm Ao Asc diam:  2.40 cm MITRAL VALVE MV Area (PHT): 4.24 cm     SHUNTS MV Area VTI:   1.99 cm     Systemic VTI:  0.18 m MV Peak grad:  3.7 mmHg     Systemic Diam: 2.00 cm MV Mean grad:  2.0 mmHg MV Vmax:       0.96 m/s MV Vmean:      67.1 cm/s MV Decel Time: 179 msec MV E velocity: 103.00 cm/s MV A velocity: 113.00 cm/s MV E/A ratio:  0.91 Lonni Nanas MD Electronically signed by Lonni Nanas MD Signature Date/Time: 06/20/2024/2:34:35 PM    Final    CT ANGIO HEAD NECK W WO CM Result Date: 06/19/2024 EXAM: CTA HEAD AND NECK WITHOUT AND WITH 06/19/2024 02:23:47 PM TECHNIQUE: CTA of the head and neck was performed without and with the administration of 75 mL of iohexol (OMNIPAQUE) 350 MG/ML injection. Multiplanar 2D and/or 3D reformatted images are provided for review. Automated exposure control, iterative reconstruction, and/or weight based adjustment of the mA/kV was utilized to reduce the radiation dose to as low as reasonably achievable. Stenosis of the internal carotid arteries measured using NASCET criteria. COMPARISON: None available CLINICAL HISTORY: Stroke/TIA, determine embolic source. FINDINGS: CTA NECK: AORTIC ARCH AND ARCH  VESSELS: No dissection or arterial injury. No significant stenosis of the brachiocephalic or subclavian arteries. CERVICAL CAROTID ARTERIES: No dissection, arterial injury, or hemodynamically significant stenosis by NASCET criteria. CERVICAL VERTEBRAL ARTERIES: No dissection, arterial injury, or significant stenosis. LUNGS AND MEDIASTINUM: Unremarkable. SOFT TISSUES: Right ocular prosthesis. BONES: Cervical spine kyphosis centered at C4. CTA HEAD: ANTERIOR CIRCULATION: No significant stenosis of the internal carotid arteries. No significant stenosis of the anterior cerebral arteries. No significant stenosis of the middle cerebral arteries. There is a 2x1 mm inferiorly directed outpouching of  the right supraclinoid internal carotid artery, likely representing infundibulum of the right posterior communicating artery versus small aneurysm. No aneurysm. POSTERIOR CIRCULATION: No significant stenosis of the posterior cerebral arteries. No significant stenosis of the basilar artery. No significant stenosis of the vertebral arteries. No aneurysm. OTHER: No dural venous sinus thrombosis on this non-dedicated study. IMPRESSION: 1. No large vessel occlusion or hemodynamically significant stenosis in the head or neck. 2. A 2x1 mm inferiorly directed outpouching of the right intracranial internal carotid artery likely represents infundibulum of the right posterior communicating artery versus possible small aneurysm. Electronically signed by: prentice spade 06/19/2024 03:38 PM EST RP Workstation: [id number]   MR BRAIN WO CONTRAST Addendum Date: 06/19/2024 ADDENDUM #1 ADDENDUM: On further review, there is a tiny focus of acute infarction in the cerebellar vermis on axial image 20 series 2 with matched hypointensity on the ADC. Findings were discussed with Dr. cindy by phone at 12:43 pm on 06/19/2024. ---------------------------------------------------- Electronically signed by: Ryan Chess MD 06/19/2024 12:43 PM EST RP  Workstation: [id number]   Result Date: 06/19/2024 ORIGINAL REPORT EXAM: MRI BRAIN WITHOUT CONTRAST 06/19/2024 10:55:45 AM TECHNIQUE: Multiplanar multisequence MRI of the head/brain was performed without the administration of intravenous contrast. COMPARISON: CT head 06/18/2024. CLINICAL HISTORY: Stroke/TIA FINDINGS: BRAIN AND VENTRICLES: No acute infarct. No intracranial hemorrhage. No mass. No midline shift. No hydrocephalus. The sella is unremarkable. Normal flow voids. ORBITS: Prior right vitreoretinal surgery. SINUSES AND MASTOIDS: Left maxillary sinus atelectasis. BONES AND SOFT TISSUES: Normal marrow signal. No acute soft tissue abnormality. IMPRESSION: 1. Normal brain MRI. Electronically signed by: Ryan Chess MD 06/19/2024 12:17 PM EST RP Workstation: [id number]   CT HEAD CODE STROKE WO CONTRAST Result Date: 06/18/2024 CLINICAL DATA:  Code stroke. Initial evaluation for acute neuro deficit, stroke suspected. EXAM: CT HEAD WITHOUT CONTRAST TECHNIQUE: Contiguous axial images were obtained from the base of the skull through the vertex without intravenous contrast. RADIATION DOSE REDUCTION: This exam was performed according to the departmental dose-optimization program which includes automated exposure control, adjustment of the mA and/or kV according to patient size and/or use of iterative reconstruction technique. COMPARISON:  None Available. FINDINGS: Brain: Cerebral volume within normal limits. No acute intracranial hemorrhage. No acute large vessel territory infarct. Small remote lacunar infarct at the right lentiform nucleus. No mass lesion or midline shift. No hydrocephalus or extra-axial fluid collection. Vascular: No abnormal hyperdense vessel. Skull: Scalp soft tissues within normal limits.  Calvarium intact. Sinuses/Orbits: Postoperative changes noted at the right globe. Globes orbital soft tissues demonstrate no acute finding. Changes of chronic left maxillary sinusitis noted. Paranasal  sinuses are otherwise clear. No mastoid effusion. Other: None. ASPECTS [facility] Stroke Program Early CT Score) - Ganglionic level infarction (caudate, lentiform nuclei, internal capsule, insula, M1-M3 cortex): 7 - Supraganglionic infarction (M4-M6 cortex): 3 Total score (0-10 with 10 being normal): 10 IMPRESSION: 1. No acute intracranial abnormality. 2. ASPECTS is 10. 3. Chronic lacunar infarct at the right lentiform nucleus. Results were called by telephone at the time of interpretation on 06/18/2024 at 8:30 pm to provider [facility] , who verbally acknowledged these results. Electronically Signed   By: Morene Hoard M.D.   On: 06/18/2024 20:31     PHYSICAL EXAM  Temp:  [97.5 F (36.4 C)-98.9 F (37.2 C)] 98.9 F (37.2 C) (11/15 1129) Pulse Rate:  [69-84] 82 (11/15 1129) Resp:  [17-18] 17 (11/15 1129) BP: (110-130)/(61-78) 115/73 (11/15 1129) SpO2:  [96 %-99 %] 96 % (11/15 1129) Weight:  [68.8 kg] 68.8 kg (11/15  64)  General - Well nourished, well developed, in no apparent distress.  Ophthalmologic - fundi not visualized due to noncooperation.  Cardiovascular - Regular rhythm and rate.  Mental Status -  Level of arousal and orientation to time, place, and person were intact. Language including expression, naming, repetition, comprehension was assessed and found intact. Attention span and concentration were normal. Recent and remote memory were intact. Fund of Knowledge was assessed and was intact.  Cranial Nerves II - XII - II - Visual field intact OU. III, IV, VI - Extraocular movements intact. V - Facial sensation intact bilaterally. VII - Facial movement intact bilaterally. VIII - Hearing & vestibular intact bilaterally. X - Palate elevates symmetrically. XI - Chin turning & shoulder shrug intact bilaterally. XII - Tongue protrusion intact.  Motor Strength - The patient's strength was normal in all extremities and pronator drift was absent.  Bulk was normal and  fasciculations were absent.   Motor Tone - Muscle tone was assessed at the neck and appendages and was normal.  Reflexes - The patient's reflexes were symmetrical in all extremities and she had no pathological reflexes.  Sensory - Light touch, temperature/pinprick were assessed and were symmetrical.    Coordination - The patient had normal movements in the hands and feet with no ataxia or dysmetria.  Tremor was absent.  Gait and Station - deferred.   ASSESSMENT/PLAN Ms. Cynthia [name] is a 58 y.o. 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 CT no acute finding. MRI done showed small infarct at cerebellar vermis. No TNK given due to outside window.    Stroke:  small cerebellar vermis infarct likely secondary to small vessel disease source CT head no acute finding.  MRI done showed small infarct at cerebellar vermis.  CTA head and neck no LVO or dissection 2D Echo  EF 60-65% LDL 98 HgbA1c 5.1 UDS neg SCDs for VTE prophylaxis No antithrombotic prior to admission, now on aspirin 81 mg daily and clopidogrel 75 mg daily DAPT for  3 weeks and then ASA alone.  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 Cre 1.15--0.98  Hospital day # 0  Neurology will sign off. Please call with questions. Pt will follow up with stroke clinic NP at [facility] in about 4 weeks. Thanks for the consult.   Ary Cummins, MD PhD Stroke Neurology 06/20/2024 2:45  PM    To contact Stroke Continuity provider, please refer to [email or web address]. After hours, contact General Neurology

## 2024-06-20 NOTE — Discharge Summary (Signed)
 Physician Discharge Summary   Patient: Cynthia [name] MRN: [id number] DOB: Jan 27, 1966  Admit date:     06/18/2024  Discharge date: {dischdate:26783}  Discharge Physician: Concepcion Riser   PCP: Oris Camie BRAVO, NP   Recommendations at discharge:  {Tip this will not be part of the note when signed- Example include specific recommendations for outpatient follow-up, pending tests to follow-up on. (Optional):26781}  PCP follow up in 1 week. Neurology follow up as scheduled.  Discharge Diagnoses: Principal Problem:   Acute CVA (cerebrovascular accident) (HCC) Active Problems:   History of HIV infection (HCC)   HLD (hyperlipidemia)   History of essential hypertension  Resolved Problems:   Disequilibrium  Hospital Course: Cynthia [name] is a 58 y.o. female with medical history significant for HIV, essential hypertension, hyperlipidemia, who is admitted to [facility] on 06/18/2024 by way of transfer from [facility] with disequilibrium after presenting from home to the latter facility complaining of imbalance.   Neurology advised to start aspirin and plavix along with statin Admitted to [facility] for further stroke work up.  Assessment and Plan: Acute cerebellar stroke- MRI showed evidence of acute ischemic infarct involving cerebellar vermis. CTA head and neck - no LVO, 2x1 mm inferiorly directed outpouching of the right supraclinoid internal carotid artery, likely representing infundibulum of the right posterior communicating artery versus small aneurysm. Neurology advised 3 weeks of aspirin and Plavix followed by aspirin alone. Crestor  dose increased to 20mg . Advised PCP and neurology follow up as scheduled. PT/ OT evaluated, no needs.  HIV disease- Resumed Discovy and Pifeltro .  Hypertension- Continue home dose lisinopril .  Hyperlipidemia- LDL 98, increased Crestor  to 20mg  daily.   {Tip this will not be part of the note when signed Body mass index is 25.24 kg/m. , ,   (Optional):26781}  {(NOTE) Pain control PDMP Statment (Optional):26782} Consultants: Neurology Procedures performed: none  Disposition: Home Diet recommendation:  Discharge Diet Orders (From admission, onward)     Start     Ordered   06/20/24 0000  Diet - low sodium heart healthy        06/20/24 1506           Cardiac diet DISCHARGE MEDICATION: Allergies as of 06/20/2024       Reactions   Dapsone Shortness Of Breath   Amoxicillin Rash   Nitrofurantoin  Nausea And Vomiting, Rash        Medication List     STOP taking these medications    omeprazole  40 MG capsule Commonly known as: PRILOSEC       TAKE these medications    aspirin EC 81 MG tablet Take 1 tablet (81 mg total) by mouth daily. Swallow whole. Start taking on: June 21, 2024   clopidogrel 75 MG tablet Commonly known as: PLAVIX Take 1 tablet (75 mg total) by mouth daily for 20 days. Start taking on: June 21, 2024   Descovy  200-25 MG tablet Generic drug: emtricitabine -tenofovir  AF Take 1 tablet by mouth daily. What changed: when to take this   lisinopril  10 MG tablet Commonly known as: ZESTRIL  Take 1 tablet (10 mg total) by mouth daily.   MULTIVITAMIN PO Take by mouth.   Pifeltro  100 MG Tabs tablet Generic drug: doravirine  Take 1 tablet (100 mg total) by mouth daily. What changed: when to take this   psyllium 58.6 % packet Commonly known as: METAMUCIL Take 1 packet by mouth daily.   rosuvastatin  10 MG tablet Commonly known as: CRESTOR  Take 2 tablets (20 mg total) by  mouth daily. What changed: how much to take   Vitamin D  50 MCG (2000 UT) tablet Take 1 tablet (2,000 Units total) by mouth daily.   ZYRTEC PO Take 1 tablet by mouth daily as needed (for allergies).        Follow-up Information     [facility] Follow up in 3 week(s).   Specialty: Neurology Contact information: [address] [place]   [address] [phone]        Oris Camie BRAVO, NP Follow up in 1 week(s).   Specialty: Nurse Practitioner Contact information: [address] [place] [address] [place] [address] [phone]                Discharge Exam: Cynthia [name]   06/18/24 1956 06/19/24 0500 06/20/24 0438  Weight: 68 kg 67.4 kg 68.8 kg      06/20/2024   11:29 AM 06/20/2024    7:46 AM 06/20/2024    4:38 AM  Vitals with BMI  Weight   151 lbs 11 oz  BMI   25.24  Systolic 115 130   Diastolic 73 75   Pulse 82 72    General - Middle aged Caucasian female, no apparent distress HEENT - PERRLA, EOMI, atraumatic head, non tender sinuses. Lung - Clear, no rales, rhonchi, wheezes. Heart - S1, S2 heard, no murmurs, rubs, pedal edema. Abdomen - Soft, non tender, bowel sounds good Neuro - Alert, awake and oriented x 3, non focal exam. Skin - Warm and dry.  Condition at discharge: stable  The results of significant diagnostics from this hospitalization (including imaging, microbiology, ancillary and laboratory) are listed below for reference.   Imaging Studies: ECHOCARDIOGRAM COMPLETE Result Date: 06/20/2024    ECHOCARDIOGRAM REPORT   Patient Name:   Cynthia [name] Date of Exam: 06/20/2024 Medical Rec #:  [id number]     Height:       65.0 in Accession #:    [id number]    Weight:       151.7 lb Date of Birth:  February 10, 1966      BSA:          1.759 m Patient Age:    58 years      BP:           110/78 mmHg Patient Gender: F             HR:           80 bpm. Exam Location:  Inpatient Procedure: 2D Echo (Both Spectral and Color Flow Doppler were utilized during            procedure). Indications:   Stroke  History:       Patient has no prior history of Echocardiogram examinations.                Stroke.  Sonographer:   Norleen Amour Referring      ARY XU Phys: IMPRESSIONS  1. Left ventricular ejection fraction, by estimation, is 60 to 65%. The left ventricle has normal function. The left ventricle has no regional  wall motion abnormalities. Left ventricular diastolic parameters were normal.  2. Right ventricular systolic function is normal. The right ventricular size is normal. Tricuspid regurgitation signal is inadequate for assessing PA pressure.  3. The mitral valve is normal in structure. Trivial mitral valve regurgitation. No evidence of mitral stenosis.  4. The aortic valve is tricuspid. Aortic valve regurgitation is not visualized. No aortic stenosis is present.  5.  The inferior vena cava is normal in size with greater than 50% respiratory variability, suggesting right atrial pressure of 3 mmHg. FINDINGS  Left Ventricle: Left ventricular ejection fraction, by estimation, is 60 to 65%. The left ventricle has normal function. The left ventricle has no regional wall motion abnormalities. The left ventricular internal cavity size was normal in size. There is  no left ventricular hypertrophy. Left ventricular diastolic parameters were normal. Right Ventricle: The right ventricular size is normal. No increase in right ventricular wall thickness. Right ventricular systolic function is normal. Tricuspid regurgitation signal is inadequate for assessing PA pressure. Left Atrium: Left atrial size was normal in size. Right Atrium: Right atrial size was normal in size. Pericardium: There is no evidence of pericardial effusion. Mitral Valve: The mitral valve is normal in structure. Trivial mitral valve regurgitation. No evidence of mitral valve stenosis. MV peak gradient, 3.7 mmHg. The mean mitral valve gradient is 2.0 mmHg. Tricuspid Valve: The tricuspid valve is normal in structure. Tricuspid valve regurgitation is trivial. Aortic Valve: The aortic valve is tricuspid. Aortic valve regurgitation is not visualized. No aortic stenosis is present. Pulmonic Valve: The pulmonic valve was not well visualized. Pulmonic valve regurgitation is trivial. Aorta: The aortic root and ascending aorta are structurally normal, with no evidence of  dilitation. Venous: The inferior vena cava is normal in size with greater than 50% respiratory variability, suggesting right atrial pressure of 3 mmHg. IAS/Shunts: The atrial septum is grossly normal.  LEFT VENTRICLE PLAX 2D LVIDd:         4.40 cm     Diastology LVIDs:         2.80 cm     LV e' medial:    9.46 cm/s LV PW:         0.80 cm     LV E/e' medial:  10.9 LV IVS:        0.70 cm     LV e' lateral:   8.92 cm/s LVOT diam:     2.00 cm     LV E/e' lateral: 11.5 LV SV:         57 LV SV Index:   32 LVOT Area:     3.14 cm  LV Volumes (MOD) LV vol d, MOD A2C: 52.7 ml LV vol d, MOD A4C: 70.6 ml LV vol s, MOD A2C: 14.8 ml LV vol s, MOD A4C: 31.4 ml LV SV MOD A2C:     37.9 ml LV SV MOD A4C:     70.6 ml LV SV MOD BP:      40.3 ml RIGHT VENTRICLE             IVC RV Basal diam:  3.80 cm     IVC diam: 1.20 cm RV S prime:     13.50 cm/s TAPSE (M-mode): 2.3 cm      PULMONARY VEINS                             Diastolic Velocity: 32.20 cm/s                             S/D Velocity:       1.70                             Systolic Velocity:  55.40 cm/s LEFT ATRIUM  Index        RIGHT ATRIUM           Index LA diam:        3.50 cm 1.99 cm/m   RA Area:     12.90 cm LA Vol (A2C):   26.2 ml 14.90 ml/m  RA Volume:   31.70 ml  18.02 ml/m LA Vol (A4C):   25.2 ml 14.33 ml/m LA Biplane Vol: 26.1 ml 14.84 ml/m  AORTIC VALVE             PULMONIC VALVE LVOT Vmax:   93.40 cm/s  PV Vmax:       1.30 m/s LVOT Vmean:  65.100 cm/s PV Peak grad:  6.8 mmHg LVOT VTI:    0.181 m  AORTA Ao Root diam: 2.20 cm Ao Asc diam:  2.40 cm MITRAL VALVE MV Area (PHT): 4.24 cm     SHUNTS MV Area VTI:   1.99 cm     Systemic VTI:  0.18 m MV Peak grad:  3.7 mmHg     Systemic Diam: 2.00 cm MV Mean grad:  2.0 mmHg MV Vmax:       0.96 m/s MV Vmean:      67.1 cm/s MV Decel Time: 179 msec MV E velocity: 103.00 cm/s MV A velocity: 113.00 cm/s MV E/A ratio:  0.91 Lonni Nanas MD Electronically signed by Lonni Nanas MD Signature  Date/Time: 06/20/2024/2:34:35 PM    Final    CT ANGIO HEAD NECK W WO CM Result Date: 06/19/2024 EXAM: CTA HEAD AND NECK WITHOUT AND WITH 06/19/2024 02:23:47 PM TECHNIQUE: CTA of the head and neck was performed without and with the administration of 75 mL of iohexol (OMNIPAQUE) 350 MG/ML injection. Multiplanar 2D and/or 3D reformatted images are provided for review. Automated exposure control, iterative reconstruction, and/or weight based adjustment of the mA/kV was utilized to reduce the radiation dose to as low as reasonably achievable. Stenosis of the internal carotid arteries measured using NASCET criteria. COMPARISON: None available CLINICAL HISTORY: Stroke/TIA, determine embolic source. FINDINGS: CTA NECK: AORTIC ARCH AND ARCH VESSELS: No dissection or arterial injury. No significant stenosis of the brachiocephalic or subclavian arteries. CERVICAL CAROTID ARTERIES: No dissection, arterial injury, or hemodynamically significant stenosis by NASCET criteria. CERVICAL VERTEBRAL ARTERIES: No dissection, arterial injury, or significant stenosis. LUNGS AND MEDIASTINUM: Unremarkable. SOFT TISSUES: Right ocular prosthesis. BONES: Cervical spine kyphosis centered at C4. CTA HEAD: ANTERIOR CIRCULATION: No significant stenosis of the internal carotid arteries. No significant stenosis of the anterior cerebral arteries. No significant stenosis of the middle cerebral arteries. There is a 2x1 mm inferiorly directed outpouching of the right supraclinoid internal carotid artery, likely representing infundibulum of the right posterior communicating artery versus small aneurysm. No aneurysm. POSTERIOR CIRCULATION: No significant stenosis of the posterior cerebral arteries. No significant stenosis of the basilar artery. No significant stenosis of the vertebral arteries. No aneurysm. OTHER: No dural venous sinus thrombosis on this non-dedicated study. IMPRESSION: 1. No large vessel occlusion or hemodynamically significant  stenosis in the head or neck. 2. A 2x1 mm inferiorly directed outpouching of the right intracranial internal carotid artery likely represents infundibulum of the right posterior communicating artery versus possible small aneurysm. Electronically signed by: prentice spade 06/19/2024 03:38 PM EST RP Workstation: [id number]   MR BRAIN WO CONTRAST Addendum Date: 06/19/2024 ******** ADDENDUM #1 ******** ADDENDUM: On further review, there is a tiny focus of acute infarction in the cerebellar vermis on axial image 20 series 2 with matched hypointensity on the  ADC. Findings were discussed with Dr. cindy by phone at 12:43 pm on 06/19/2024. ---------------------------------------------------- Electronically signed by: Ryan Chess MD 06/19/2024 12:43 PM EST RP Workstation: [id number]   Result Date: 06/19/2024 ******** ORIGINAL REPORT ******** EXAM: MRI BRAIN WITHOUT CONTRAST 06/19/2024 10:55:45 AM TECHNIQUE: Multiplanar multisequence MRI of the head/brain was performed without the administration of intravenous contrast. COMPARISON: CT head 06/18/2024. CLINICAL HISTORY: Stroke/TIA FINDINGS: BRAIN AND VENTRICLES: No acute infarct. No intracranial hemorrhage. No mass. No midline shift. No hydrocephalus. The sella is unremarkable. Normal flow voids. ORBITS: Prior right vitreoretinal surgery. SINUSES AND MASTOIDS: Left maxillary sinus atelectasis. BONES AND SOFT TISSUES: Normal marrow signal. No acute soft tissue abnormality. IMPRESSION: 1. Normal brain MRI. Electronically signed by: Ryan Chess MD 06/19/2024 12:17 PM EST RP Workstation: [id number]   CT HEAD CODE STROKE WO CONTRAST Result Date: 06/18/2024 CLINICAL DATA:  Code stroke. Initial evaluation for acute neuro deficit, stroke suspected. EXAM: CT HEAD WITHOUT CONTRAST TECHNIQUE: Contiguous axial images were obtained from the base of the skull through the vertex without intravenous contrast. RADIATION DOSE REDUCTION: This exam was performed according to the  departmental dose-optimization program which includes automated exposure control, adjustment of the mA and/or kV according to patient size and/or use of iterative reconstruction technique. COMPARISON:  None Available. FINDINGS: Brain: Cerebral volume within normal limits. No acute intracranial hemorrhage. No acute large vessel territory infarct. Small remote lacunar infarct at the right lentiform nucleus. No mass lesion or midline shift. No hydrocephalus or extra-axial fluid collection. Vascular: No abnormal hyperdense vessel. Skull: Scalp soft tissues within normal limits.  Calvarium intact. Sinuses/Orbits: Postoperative changes noted at the right globe. Globes orbital soft tissues demonstrate no acute finding. Changes of chronic left maxillary sinusitis noted. Paranasal sinuses are otherwise clear. No mastoid effusion. Other: None. ASPECTS [facility] Stroke Program Early CT Score) - Ganglionic level infarction (caudate, lentiform nuclei, internal capsule, insula, M1-M3 cortex): 7 - Supraganglionic infarction (M4-M6 cortex): 3 Total score (0-10 with 10 being normal): 10 IMPRESSION: 1. No acute intracranial abnormality. 2. ASPECTS is 10. 3. Chronic lacunar infarct at the right lentiform nucleus. Results were called by telephone at the time of interpretation on 06/18/2024 at 8:30 pm to provider [facility] , who verbally acknowledged these results. Electronically Signed   By: Morene Hoard M.D.   On: 06/18/2024 20:31    Microbiology: Results for orders placed or performed during the hospital encounter of 11/07/21  Urine Culture     Status: Abnormal   Collection Time: 11/07/21 10:03 AM   Specimen: Urine, Clean Catch  Result Value Ref Range Status   Specimen Description URINE, CLEAN CATCH  Final   Special Requests   Final    NONE Performed at [facility], [address] [place], [place] [address]    Culture 60,000 COLONIES/mL PROTEUS MIRABILIS (A)  Final   Report Status 11/09/2021 FINAL   Final   Organism ID, Bacteria PROTEUS MIRABILIS (A)  Final      Susceptibility   Proteus mirabilis - MIC*    AMPICILLIN <=2 SENSITIVE Sensitive     CEFAZOLIN <=4 SENSITIVE Sensitive     CEFEPIME <=0.12 SENSITIVE Sensitive     CEFTRIAXONE <=0.25 SENSITIVE Sensitive     CIPROFLOXACIN <=0.25 SENSITIVE Sensitive     GENTAMICIN <=1 SENSITIVE Sensitive     IMIPENEM 4 SENSITIVE Sensitive     NITROFURANTOIN  128 RESISTANT Resistant     TRIMETH/SULFA <=20 SENSITIVE Sensitive     AMPICILLIN/SULBACTAM <=2 SENSITIVE Sensitive     PIP/TAZO <=  4 SENSITIVE Sensitive     * 60,000 COLONIES/mL PROTEUS MIRABILIS    Labs: CBC: Recent Labs  Lab 06/18/24 2016 06/19/24 0442  WBC 8.5 5.9  NEUTROABS 5.9 3.1  HGB 12.5 12.5  HCT 36.3 35.7*  MCV 92.4 91.8  PLT 212 190   Basic Metabolic Panel: Recent Labs  Lab 06/18/24 2016 06/19/24 0442  NA 138 138  K 4.2 3.9  CL 103 106  CO2 22 24  GLUCOSE 113* 106*  BUN 28* 22*  CREATININE 1.15* 0.98  CALCIUM  9.7 9.4  MG  --  1.9   Liver Function Tests: Recent Labs  Lab 06/18/24 2016 06/19/24 0442  AST 22 20  ALT 19 18  ALKPHOS 61 47  BILITOT 0.3 0.5  PROT 7.3 6.7  ALBUMIN 4.6 3.7   CBG: Recent Labs  Lab 06/18/24 1953  GLUCAP 109*    Discharge time spent: 35 minutes.  Signed: Concepcion Riser, MD [facility] 06/20/2024

## 2024-06-20 NOTE — Progress Notes (Signed)
 OT Cancellation Note  Patient Details Name: KARAH CARUTHERS MRN: [id number] DOB: 12-25-1965   Cancelled Treatment:    Reason Eval/Treat Not Completed: Patient at procedure or test/ unavailable (ECHO in room)  Gicela Schwarting K, OTD, OTR/L [facility] [phone]   Laneta MARLA Pereyra 06/20/2024, 9:44 AM

## 2024-06-20 NOTE — Plan of Care (Signed)
  Problem: Education: Goal: Knowledge of General Education information will improve Description: Including pain rating scale, medication(s)/side effects and non-pharmacologic comfort measures Outcome: Adequate for Discharge   Problem: Health Behavior/Discharge Planning: Goal: Ability to manage health-related needs will improve Outcome: Adequate for Discharge   Problem: Clinical Measurements: Goal: Ability to maintain clinical measurements within normal limits will improve Outcome: Adequate for Discharge Goal: Will remain free from infection Outcome: Adequate for Discharge Goal: Diagnostic test results will improve Outcome: Adequate for Discharge Goal: Respiratory complications will improve Outcome: Adequate for Discharge Goal: Cardiovascular complication will be avoided Outcome: Adequate for Discharge   Problem: Activity: Goal: Risk for activity intolerance will decrease Outcome: Adequate for Discharge   Problem: Nutrition: Goal: Adequate nutrition will be maintained Outcome: Adequate for Discharge   Problem: Coping: Goal: Level of anxiety will decrease Outcome: Adequate for Discharge   Problem: Elimination: Goal: Will not experience complications related to bowel motility Outcome: Adequate for Discharge Goal: Will not experience complications related to urinary retention Outcome: Adequate for Discharge   Problem: Pain Managment: Goal: General experience of comfort will improve and/or be controlled Outcome: Adequate for Discharge   Problem: Safety: Goal: Ability to remain free from injury will improve Outcome: Adequate for Discharge   Problem: Skin Integrity: Goal: Risk for impaired skin integrity will decrease Outcome: Adequate for Discharge   Problem: Education: Goal: Knowledge of disease or condition will improve 06/20/2024 1756 by Lorenna Lurry, RN Outcome: Adequate for Discharge 06/20/2024 1533 by Raenelle Brownie, RN Outcome: Progressing Goal: Knowledge  of secondary prevention will improve (MUST DOCUMENT ALL) Outcome: Adequate for Discharge Goal: Knowledge of patient specific risk factors will improve (DELETE if not current risk factor) Outcome: Adequate for Discharge   Problem: Ischemic Stroke/TIA Tissue Perfusion: Goal: Complications of ischemic stroke/TIA will be minimized Outcome: Adequate for Discharge   Problem: Coping: Goal: Will verbalize positive feelings about self 06/20/2024 1756 by Raenelle Brownie, RN Outcome: Adequate for Discharge 06/20/2024 1533 by Undray Allman, RN Outcome: Progressing Goal: Will identify appropriate support needs 06/20/2024 1756 by Carolos Fecher, RN Outcome: Adequate for Discharge 06/20/2024 1533 by Renad Jenniges, RN Outcome: Progressing   Problem: Health Behavior/Discharge Planning: Goal: Ability to manage health-related needs will improve 06/20/2024 1756 by Meenakshi Sazama, RN Outcome: Adequate for Discharge 06/20/2024 1533 by Ginia Rudell, RN Outcome: Progressing Goal: Goals will be collaboratively established with patient/family 06/20/2024 1756 by Darrius Montano, RN Outcome: Adequate for Discharge 06/20/2024 1533 by Azaryah Heathcock, RN Outcome: Progressing   Problem: Self-Care: Goal: Ability to participate in self-care as condition permits will improve Outcome: Adequate for Discharge Goal: Verbalization of feelings and concerns over difficulty with self-care will improve Outcome: Adequate for Discharge Goal: Ability to communicate needs accurately will improve 06/20/2024 1756 by Kahle Mcqueen, RN Outcome: Adequate for Discharge 06/20/2024 1533 by Amia Rynders, RN Outcome: Progressing   Problem: Nutrition: Goal: Risk of aspiration will decrease 06/20/2024 1756 by Brees Hounshell, RN Outcome: Adequate for Discharge 06/20/2024 1533 by Hampton Cost, RN Outcome: Progressing Goal: Dietary intake will improve Outcome: Adequate for Discharge

## 2024-06-20 NOTE — Progress Notes (Signed)
  Echocardiogram 2D Echocardiogram has been performed.  Norleen ORN Solomiya Pascale 06/20/2024, 10:06 AM

## 2024-06-22 ENCOUNTER — Telehealth: Payer: Self-pay

## 2024-06-22 NOTE — Transitions of Care (Post Inpatient/ED Visit) (Signed)
 06/22/2024  Name: Cynthia [name] MRN: [id number] DOB: 26-Apr-1966  Today's TOC FU Call Status: Today's TOC FU Call Status:: Successful TOC FU Call Completed TOC FU Call Complete Date: 06/22/24  Patient's Name and Date of Birth confirmed. DOB, Name  Transition Care Management Follow-up Telephone Call Date of Discharge: 06/20/24 Discharge Facility: [facility]) Type of Discharge: Inpatient Admission Primary Inpatient Discharge Diagnosis:: CVA How have you been since you were released from the hospital?: Better Any questions or concerns?: Yes Patient Questions/Concerns:: wondering results of Echocardiogram Patient Questions/Concerns Addressed: Notified Provider of Patient Questions/Concerns  Items Reviewed: Did you receive and understand the discharge instructions provided?: Yes Medications obtained,verified, and reconciled?: Yes (Medications Reviewed) Any new allergies since your discharge?: No Dietary orders reviewed?: NA Do you have support at home?: Yes People in Home [RPT]: spouse  Medications Reviewed Today: Medications Reviewed Today     Reviewed by Lavelle Charmaine NOVAK, LPN (Licensed Practical Nurse) on 06/22/24 at 1355  Med List Status: <None>   Medication Order Taking? Sig Documenting Provider Last Dose Status Informant  aspirin EC 81 MG tablet [id number]  Take 1 tablet (81 mg total) by mouth daily. Swallow whole. Darci Pore, MD  Active   Cetirizine HCl (ZYRTEC PO) [id number] No Take 1 tablet by mouth daily as needed (for allergies). [provider] Past Week Active Self  Cholecalciferol  (VITAMIN D ) 50 MCG (2000 UT) tablet [id number] No Take 1 tablet (2,000 Units total) by mouth daily. Early, Sara E, NP 06/18/2024 Morning Active Self  clopidogrel (PLAVIX) 75 MG tablet [id number]  Take 1 tablet (75 mg total) by mouth daily for 20 days. Darci Pore, MD  Active   doravirine  (PIFELTRO ) 100 MG TABS tablet [id number] No Take 1 tablet (100 mg total) by  mouth daily.  Patient taking differently: Take 100 mg by mouth every evening.   Luiz Channel, MD 06/17/2024 Active Self  emtricitabine -tenofovir  AF (DESCOVY ) 200-25 MG tablet [id number] No Take 1 tablet by mouth daily.  Patient taking differently: Take 1 tablet by mouth every evening.   Luiz Channel, MD 06/17/2024 Active Self  lisinopril  (ZESTRIL ) 10 MG tablet [id number] No Take 1 tablet (10 mg total) by mouth daily. Early, Sara E, NP 06/18/2024 Morning Active Self  Multiple Vitamin (MULTIVITAMIN PO) [id number] No Take by mouth. [provider] 06/18/2024 Morning Active Self  psyllium (METAMUCIL) 58.6 % packet [id number] No Take 1 packet by mouth daily. [provider] 06/18/2024 Morning Active Self  rosuvastatin  (CRESTOR ) 20 MG tablet [id number]  Take 1 tablet (20 mg total) by mouth daily. Darci Pore, MD  Active             Home Care and Equipment/Supplies: Were Home Health Services Ordered?: NA Any new equipment or medical supplies ordered?: NA  Functional Questionnaire: Do you need assistance with bathing/showering or dressing?: No Do you need assistance with meal preparation?: No Do you need assistance with eating?: No Do you have difficulty maintaining continence: No Do you need assistance with getting out of bed/getting out of a chair/moving?: No Do you have difficulty managing or taking your medications?: No  Follow up appointments reviewed: PCP Follow-up appointment confirmed?: Yes Date of PCP follow-up appointment?: 06/25/24 Follow-up Provider: Camie Doing NP Specialist Hospital Follow-up appointment confirmed?: Yes Date of Specialist follow-up appointment?: 07/23/24 Follow-Up Specialty Provider:: neurology Do you need transportation to your follow-up appointment?: No Do you understand care options if your condition(s) worsen?: Yes-patient verbalized understanding    SIGNATURE Charmaine Lavelle, LPN [facility] Health Advisor  [facility] You Are. We Are. [facility] Direct Dial [phone]

## 2024-06-25 ENCOUNTER — Encounter: Payer: Self-pay | Admitting: Nurse Practitioner

## 2024-06-25 ENCOUNTER — Other Ambulatory Visit (HOSPITAL_COMMUNITY): Payer: Self-pay

## 2024-06-25 ENCOUNTER — Ambulatory Visit: Admitting: Nurse Practitioner

## 2024-06-25 VITALS — BP 122/74 | HR 73 | Wt 151.0 lb

## 2024-06-25 DIAGNOSIS — Z09 Encounter for follow-up examination after completed treatment for conditions other than malignant neoplasm: Secondary | ICD-10-CM | POA: Diagnosis not present

## 2024-06-25 DIAGNOSIS — F418 Other specified anxiety disorders: Secondary | ICD-10-CM

## 2024-06-25 DIAGNOSIS — Z8673 Personal history of transient ischemic attack (TIA), and cerebral infarction without residual deficits: Secondary | ICD-10-CM

## 2024-06-25 DIAGNOSIS — Z823 Family history of stroke: Secondary | ICD-10-CM | POA: Diagnosis not present

## 2024-06-25 MED ORDER — ESCITALOPRAM OXALATE 10 MG PO TABS
ORAL_TABLET | ORAL | 3 refills | Status: DC
  Filled 2024-06-25: qty 30, 30d supply, fill #0

## 2024-06-25 NOTE — Assessment & Plan Note (Signed)
 Recent cerebral infarction with initial altered gait and brain fog, now resolved. MRI confirmed stroke. No clear etiology identified; thought to possibly be transient blood flow restriction during exercise. No hypertension or hyperlipidemia. Family history of stroke thought to be due to heart-related issues. Neurology follow-up planned/scheduled with Cynthia [name]. Consideration of clotting disorders due to lack of clear etiology and fathers history of multiple embolic strokes. Discussed the option to test for Factor V Leiden and Prothrombin disorders. No labs needed today. Will send the request for this testing to Neurology to see if they can draw these while drawing any labs they may need.  - Continue Plavix for 20 days, then transition to baby aspirin. - Follow up with neurology in 3 weeks. - Ordered prothrombin and Factor V Leiden tests to evaluate for genetic clotting disorders.  - Monitor for any new symptoms or changes in condition.

## 2024-06-25 NOTE — Progress Notes (Signed)
 Cynthia Doing, DNP, AGNP-c [facility] [address] [place], [place] [address] Main Office [phone]  ESTABLISHED PATIENT- Hospital Follow-Up Visit on 06/25/2024  Blood pressure 122/74, pulse 73, weight 151 lb (68.5 kg), last menstrual period 05/07/2019.  HPI  Cynthia [name]  is a 58 y.o. year old female presenting today for evaluation and management following hospitalization.   Hospitalized at [facility] 11/13-11/15 for Acute CVA Has f/u schedule with neurology Started on ASA and Plavix. D/C plavix after 3 weeks Rosuvastatin  increased from 10mg  to 20mg . Goal LDL <70.  History of Present Illness Cynthia [name] is a 58 year old female who presents for hospital follow-up following a recent stroke.  She experienced symptoms while finishing a spin class, including feeling hot noticed an inability to walk straight with a left-leaning gait last Thursday. An MRI confirmed a stroke, although initial imaging was reported as clear. The symptoms of altered gait lasted approximately ten hours, with gradual improvement. She has residual brain fog but no dizziness since the incident. Headaches are still present, but mild.   A comprehensive work-up was conducted, including an echocardiogram, which was normal.. Her blood pressure and cholesterol levels were normal, and no definitive cause for the stroke was identified. She reports they speculated possibly tilting her neck back while stretching to be a possible cause.   She is currently on Plavix for 20 days and baby aspirin. She reports no issues with Plavix and aspirin, aside from needing to stop PPI she used as needed. She experiences some indigestion now.  She feels fatigued more quickly than usual. No shortness of breath or chest pain.   Her family history is significant for her father having multiple strokes and vascular dementia, attributed to ventricular tachycardia. She does not believe he was ever worked up for possible  clotting disorder.   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 She describes a high workload and difficulty delegating tasks, which has been emotionally taxing.    ROS All ROS negative with exception of what is listed in HPI  PHYSICAL EXAM Physical Exam Vitals and nursing note reviewed.  Constitutional:      General: She is not in acute distress.    Appearance: Normal appearance. She is not ill-appearing.  HENT:     Head: Normocephalic and atraumatic.  Eyes:     General: Lids are normal.     Comments: Right pupil small due to chronic vision loss.   Neck:     Vascular: No carotid bruit.  Cardiovascular:     Rate and Rhythm: Normal rate and regular rhythm.     Pulses: Normal pulses.     Heart sounds: Normal heart sounds. No murmur heard. Pulmonary:     Effort: Pulmonary effort is normal.     Breath sounds: Normal breath sounds.  Abdominal:     General: Bowel sounds are normal. There is no distension.     Palpations: Abdomen is soft.  Musculoskeletal:        General: Normal range of motion.     Cervical back: Normal range of motion. No tenderness.     Right lower leg: No edema.     Left lower leg: No edema.  Skin:    General: Skin is warm and dry.     Capillary Refill: Capillary refill takes less than 2 seconds.  Neurological:     General: No focal deficit present.  Mental Status: She is alert and oriented to person, place, and time.     Sensory: No sensory deficit.     Motor: No weakness.     Coordination: Coordination normal.  Psychiatric:        Mood and Affect: Mood normal.      ASSESSMENT & PLAN Problem List Items Addressed This Visit     History of embolic stroke   Recent cerebral infarction with initial altered gait and brain fog, now resolved. MRI confirmed stroke. No clear etiology identified; thought to possibly be transient blood flow restriction during  exercise. No hypertension or hyperlipidemia. Family history of stroke thought to be due to heart-related issues. Neurology follow-up planned/scheduled with Duwaine Pelton. Consideration of clotting disorders due to lack of clear etiology and fathers history of multiple embolic strokes. Discussed the option to test for Factor V Leiden and Prothrombin disorders. No labs needed today. Will send the request for this testing to Neurology to see if they can draw these while drawing any labs they may need.  - Continue Plavix for 20 days, then transition to baby aspirin. - Follow up with neurology in 3 weeks. - Ordered prothrombin and Factor V Leiden tests to evaluate for genetic clotting disorders.  - Monitor for any new symptoms or changes in condition.      Relevant Orders   Prothrombin Gene Mutation   Factor V Leiden Reflex to [facility] discharge follow-up - Primary   Other specified anxiety disorders   Significant stress and anxiety related to work and personal life, potentially contributing to stroke. Open to medication as long as it does not cause major brain fog. Discussed SSRIs, specifically escitalopram (Lexapro) and sertraline (Zoloft), for anxiety management. Lexapro chosen due to preference and potential for less brain fog. - Started escitalopram (Lexapro) at a low dose. - Scheduled a virtual follow-up in 4-6 weeks to assess response to medication and adjust as needed.      Relevant Medications   escitalopram (LEXAPRO) 10 MG tablet   Other Visit Diagnoses       Family history of embolic stroke       Relevant Orders   Prothrombin Gene Mutation   Factor V Leiden Reflex to R2       Review of inpatient history, labs, imaging, and recommendations for f/u.   Cynthia Doing, DNP, AGNP-c

## 2024-06-25 NOTE — Assessment & Plan Note (Signed)
 Significant stress and anxiety related to work and personal life, potentially contributing to stroke. Open to medication as long as it does not cause major brain fog. Discussed SSRIs, specifically escitalopram (Lexapro) and sertraline (Zoloft), for anxiety management. Lexapro chosen due to preference and potential for less brain fog. - Started escitalopram (Lexapro) at a low dose. - Scheduled a virtual follow-up in 4-6 weeks to assess response to medication and adjust as needed.

## 2024-06-25 NOTE — Patient Instructions (Addendum)
 I will send a message to [facility] and ask if she can get the clotting labs with any labs she wants to get.   Managing Stress, Adult Feeling a certain amount of stress is normal. Stress helps our body and mind get ready to deal with the demands of life. Stress hormones can motivate you to do well at work and meet your responsibilities. But severe or long-term (chronic) stress can affect your mental and physical health. Chronic stress puts you at higher risk for: Anxiety and depression. Other health problems such as digestive problems, muscle aches, heart disease, high blood pressure, and stroke. What are the causes? Common causes of stress include: Demands from work, such as deadlines, feeling overworked, or having long hours. Pressures at home, such as money issues, disagreements with a spouse, or parenting issues. Pressures from major life changes, such as divorce, moving, loss of a loved one, or chronic illness. You may be at higher risk for stress-related problems if you: Do not get enough sleep. Are in poor health. Do not have emotional support. Have a mental health disorder such as anxiety or depression. How to recognize stress Stress can make you: Have trouble sleeping. Feel sad, anxious, irritable, or overwhelmed. Lose your appetite. Overeat or want to eat unhealthy foods. Want to use drugs or alcohol. Stress can also cause physical symptoms, such as: Sore, tense muscles, especially in the shoulders and neck. Headaches. Trouble breathing. A faster heart rate. Stomach pain, nausea, or vomiting. Diarrhea or constipation. Trouble concentrating. Follow these instructions at home: Eating and drinking Eat a healthy diet. This includes: Eating foods that are high in fiber, such as beans, whole grains, and fresh fruits and vegetables. Limiting foods that are high in fat and processed sugars, such as fried or sweet foods. Do not skip meals or overeat. Drink enough fluid to  keep your urine pale yellow. Alcohol use Do not drink alcohol if: Your health care provider tells you not to drink. You are pregnant, may be pregnant, or are planning to become pregnant. Drinking alcohol is a way some people try to ease their stress. This can be dangerous, so if you drink alcohol: Limit how much you have to: 0-1 drink a day for women. 0-2 drinks a day for men. Know how much alcohol is in your drink. In the [place] one drink equals one 12 oz bottle of beer (355 mL), one 5 oz glass of wine (148 mL), or one 1 oz glass of hard liquor (44 mL). Activity  Include 30 minutes of exercise in your daily schedule. Exercise is a good stress reducer. Include time in your day for an activity that you find relaxing. Try taking a walk, going on a bike ride, reading a book, or listening to music. Schedule your time in a way that lowers stress, and keep a regular schedule. Focus on doing what is most important to get done. Lifestyle Identify the source of your stress and your reaction to it. See a therapist who can help you change unhelpful reactions. When there are stressful events: Talk about them with family, friends, or coworkers. Try to think realistically about stressful events and not ignore them or overreact. Try to find the positives in a stressful situation and not focus on the negatives. Cut back on responsibilities at work and home, if possible. Ask for help from friends or family members if you need it. Find ways to manage stress, such as: Mindfulness, meditation, or deep breathing. Yoga or tai chi.  Progressive muscle relaxation. Spending time in nature. Doing art, playing music, or reading. Making time for fun activities. Spending time with family and friends. Get support from family, friends, or spiritual resources. General instructions Get enough sleep. Try to go to sleep and get up at about the same time every day. Take over-the-counter and prescription medicines only  as told by your health care provider. Do not use any products that contain nicotine or tobacco. These products include cigarettes, chewing tobacco, and vaping devices, such as e-cigarettes. If you need help quitting, ask your health care provider. Do not use drugs or smoke to deal with stress. Keep all follow-up visits. This is important. Where to find support Talk with your health care provider about stress management or finding a support group. Find a therapist to work with you on your stress management techniques. Where to find more information [employer] Mental Illness: www.nami.org American Psychological Association: dicetournament.ca Contact a health care provider if: Your stress symptoms get worse. You are unable to manage your stress at home. You are struggling to stop using drugs or alcohol. Get help right away if: You may be a danger to yourself or others. You have any thoughts of death or suicide. Get help right awayif you feel like you may hurt yourself or others, or have thoughts about taking your own life. Go to your nearest emergency room or: Call 911. Call the National Suicide Prevention Lifeline at (506)678-3385 or 988 in the [place] This is open 24 hours a day. If you're a Veteran: Call 988 and press 1. This is open 24 hours a day. Text the [employer] at (314)218-7526. Summary Feeling a certain amount of stress is normal, but severe or long-term (chronic) stress can affect your mental and physical health. Chronic stress can put you at higher risk for anxiety, depression, and other health problems such as digestive problems, muscle aches, heart disease, high blood pressure, and stroke. You may be at higher risk for stress-related problems if you do not get enough sleep, are in poor health, lack emotional support, or have a mental health disorder such as anxiety or depression. Identify the source of your stress and your reaction to it. Try talking about stressful  events with family, friends, or coworkers, finding a coping method, or getting support from spiritual resources. If you need more help, talk with your health care provider about finding a support group or a mental health therapist. This information is not intended to replace advice given to you by your health care provider. Make sure you discuss any questions you have with your health care provider. Document Revised: 03/07/2023 Document Reviewed: 02/14/2021 Elsevier Patient Education  2024 [employer].

## 2024-06-26 ENCOUNTER — Other Ambulatory Visit: Payer: Self-pay

## 2024-06-26 NOTE — Progress Notes (Signed)
 Specialty Pharmacy Refill Coordination Note  Cynthia [name] is a 58 y.o. female contacted today regarding refills of specialty medication(s) Doravirine  (PIFELTRO ); Emtricitabine -Tenofovir  AF (Descovy )   Patient requested (Patient-Rptd) Pickup at [facility] date: (Patient-Rptd) 07/01/24   Medication will be filled on: 06/30/24

## 2024-06-29 ENCOUNTER — Other Ambulatory Visit: Payer: Self-pay

## 2024-06-29 NOTE — Progress Notes (Signed)
 Clinical Intervention Note  Clinical Intervention Notes: Patient reported having a stroke which resulted in an increased Crestor  dose and initiation of Plavix . No DDIs identified with Pifeltro  or Descovy    Clinical Intervention Outcomes: Prevention of an adverse drug event   Advertising Account Planner

## 2024-07-06 ENCOUNTER — Other Ambulatory Visit (HOSPITAL_COMMUNITY): Payer: Self-pay

## 2024-07-06 DIAGNOSIS — L578 Other skin changes due to chronic exposure to nonionizing radiation: Secondary | ICD-10-CM | POA: Diagnosis not present

## 2024-07-06 DIAGNOSIS — D225 Melanocytic nevi of trunk: Secondary | ICD-10-CM | POA: Diagnosis not present

## 2024-07-06 DIAGNOSIS — L814 Other melanin hyperpigmentation: Secondary | ICD-10-CM | POA: Diagnosis not present

## 2024-07-06 DIAGNOSIS — L821 Other seborrheic keratosis: Secondary | ICD-10-CM | POA: Diagnosis not present

## 2024-07-22 NOTE — Progress Notes (Unsigned)
 PATIENT: Cynthia [name] DOB: 31-Jan-1966  REASON FOR VISIT: follow up HISTORY FROM: patient PRIMARY NEUROLOGIST: Dr. Rosemarie  No chief complaint on file.    HISTORY OF PRESENT ILLNESS: Today   Cynthia [name] is a 58 y.o. female who has been followed in this office for ***. Returns today for follow-up.   Imaging:    FU: Labs: Carotid dopplers? ECHO: Discharge note Work? OSA?   HISTORY (copied from Hospital)  REVIEW OF SYSTEMS: Out of a complete 14 system review of symptoms, the patient complains only of the following symptoms, and all other reviewed systems are negative.  ALLERGIES: Allergies[1]  HOME MEDICATIONS: Outpatient Medications Prior to Visit  Medication Sig Dispense Refill   aspirin  EC 81 MG tablet Take 1 tablet (81 mg total) by mouth daily. Swallow whole. 30 tablet 12   Cetirizine HCl (ZYRTEC PO) Take 1 tablet by mouth daily as needed (for allergies).     Cholecalciferol  (VITAMIN D ) 50 MCG (2000 UT) tablet Take 1 tablet (2,000 Units total) by mouth daily. 90 tablet 3   doravirine  (PIFELTRO ) 100 MG TABS tablet Take 1 tablet (100 mg total) by mouth daily. 30 tablet 11   emtricitabine -tenofovir  AF (DESCOVY ) 200-25 MG tablet Take 1 tablet by mouth daily. 30 tablet 11   escitalopram  (LEXAPRO ) 10 MG tablet Take 1/2 tablet by mouth daily for 1 week then increase to 1 tablet daily. 30 tablet 3   lisinopril  (ZESTRIL ) 10 MG tablet Take 1 tablet (10 mg total) by mouth daily. 90 tablet 3   Multiple Vitamin (MULTIVITAMIN PO) Take by mouth.     psyllium (METAMUCIL) 58.6 % packet Take 1 packet by mouth daily.     rosuvastatin  (CRESTOR ) 20 MG tablet Take 1 tablet (20 mg total) by mouth daily. 90 tablet 3   No facility-administered medications prior to visit.    PAST MEDICAL HISTORY: 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HCC)    COVID-19    DISEASE, HYPERTENSIVE HEART, BENIGN, W/HF 11/07/2006   Qualifier: Diagnosis of  By: Raymond RN,  Delene     GERD (gastroesophageal reflux disease)    HIV (human immunodeficiency virus infection) (HCC)    HIV infection (HCC)    Hyperlipidemia    Hypertension     PAST SURGICAL HISTORY: Past Surgical History:  Procedure Laterality Date   EYE SURGERY     Six surgeries   FINGER SURGERY  05/06/2010   SCREWS IN AND OUT OF FINGER   LASER VAPORIZATION OF OF CONDYLOMA     MANDIBLE SURGERY     NOSE SURGERY      FAMILY HISTORY: Family History  Problem Relation Age of Onset   Hypertension Mother    Diabetes Father    Hypertension Father    Colon polyps Father    Cancer Maternal Grandmother        COLON   Heart disease Maternal Grandmother    Colon cancer Maternal Grandmother    Breast cancer Paternal Grandmother    Diabetes Paternal Grandmother    Colon cancer Cousin    Esophageal cancer Neg Hx    Stomach cancer Neg Hx    Rectal cancer Neg Hx     SOCIAL HISTORY: Social History   Socioeconomic History   Marital status: Married    Spouse name: Not on file   Number of children: Not on file   Years of education: Not on file   Highest education level: Some  college, no degree  Occupational History   Not on file  Tobacco Use   Smoking status: Former   Smokeless tobacco: Never   Tobacco comments:    Quit 1996  Vaping Use   Vaping status: Never Used  Substance and Sexual Activity   Alcohol use: Yes    Alcohol/week: 0.0 standard drinks of alcohol    Comment: socially   Drug use: No   Sexual activity: Yes    Partners: Male    Birth control/protection: Post-menopausal    Comment: DECLINED CONDOMS  Other Topics Concern   Not on file  Social History Narrative   Not on file   Social Drivers of Health   Tobacco Use: Medium Risk (06/25/2024)   Patient History    Smoking Tobacco Use: Former    Smokeless Tobacco Use: Never    Passive Exposure: Not on file  Financial Resource Strain: Low Risk (03/17/2024)   Overall Financial Resource Strain (CARDIA)    Difficulty  of Paying Living Expenses: Not hard at all  Food Insecurity: No Food Insecurity (06/19/2024)   Epic    Worried About Programme Researcher, Broadcasting/film/video in the Last Year: Never true    Ran Out of Food in the Last Year: Never true  Transportation Needs: No Transportation Needs (06/19/2024)   Epic    Lack of Transportation (Medical): No    Lack of Transportation (Non-Medical): No  Physical Activity: Insufficiently Active (03/17/2024)   Exercise Vital Sign    Days of Exercise per Week: 2 days    Minutes of Exercise per Session: 50 min  Stress: Stress Concern Present (03/17/2024)   [employer] of Occupational Health - Occupational Stress Questionnaire    Feeling of Stress: Rather much  Social Connections: Moderately Integrated (03/17/2024)   Social Connection and Isolation Panel    Frequency of Communication with Friends and Family: Twice a week    Frequency of Social Gatherings with Friends and Family: Once a week    Attends Religious Services: More than 4 times per year    Active Member of Clubs or Organizations: No    Attends Banker Meetings: Not on file    Marital Status: Married  Intimate Partner Violence: Not At Risk (06/19/2024)   Epic    Fear of Current or Ex-Partner: No    Emotionally Abused: No    Physically Abused: No    Sexually Abused: No  Depression (PHQ2-9): Low Risk (04/30/2024)   Depression (PHQ2-9)    PHQ-2 Score: 0  Alcohol Screen: Low Risk (03/17/2024)   Alcohol Screen    Last Alcohol Screening Score (AUDIT): 2  Housing: Low Risk (06/19/2024)   Epic    Unable to Pay for Housing in the Last Year: No    Number of Times Moved in the Last Year: 0    Homeless in the Last Year: No  Utilities: Not At Risk (06/19/2024)   Epic    Threatened with loss of utilities: No  Health Literacy: Not on file      PHYSICAL EXAM  There were no vitals filed for this visit. There is no height or weight on file to calculate BMI.  Generalized: Well developed, in no acute  distress   Neurological examination  Mentation: Alert oriented to time, place, history taking. Follows all commands speech and language fluent Cranial nerve II-XII: Pupils were equal round reactive to light. Extraocular movements were full, visual field were full on confrontational test. Facial sensation and strength were normal. Uvula tongue  midline. Head turning and shoulder shrug  were normal and symmetric. Motor: The motor testing reveals 5 over 5 strength of all 4 extremities. Good symmetric motor tone is noted throughout.  Sensory: Sensory testing is intact to soft touch on all 4 extremities. No evidence of extinction is noted.  Coordination: Cerebellar testing reveals good finger-nose-finger and heel-to-shin bilaterally.  Gait and station: Gait is normal. Tandem gait is normal. Romberg is negative. No drift is seen.  Reflexes: Deep tendon reflexes are symmetric and normal bilaterally.   DIAGNOSTIC DATA (LABS, IMAGING, TESTING) - I reviewed patient records, labs, notes, testing and imaging myself where available.  Lab Results  Component Value Date   WBC 5.9 06/19/2024   HGB 12.5 06/19/2024   HCT 35.7 (L) 06/19/2024   MCV 91.8 06/19/2024   PLT 190 06/19/2024      Component Value Date/Time   NA 138 06/19/2024 0442   NA 141 07/30/2023 0947   K 3.9 06/19/2024 0442   CL 106 06/19/2024 0442   CO2 24 06/19/2024 0442   GLUCOSE 106 (H) 06/19/2024 0442   BUN 22 (H) 06/19/2024 0442   BUN 20 07/30/2023 0947   CREATININE 0.98 06/19/2024 0442   CREATININE 1.05 (H) 04/30/2024 1549   CALCIUM  9.4 06/19/2024 0442   PROT 6.7 06/19/2024 0442   PROT 7.0 07/30/2023 0947   ALBUMIN 3.7 06/19/2024 0442   ALBUMIN 4.3 07/30/2023 0947   AST 20 06/19/2024 0442   ALT 18 06/19/2024 0442   ALKPHOS 47 06/19/2024 0442   BILITOT 0.5 06/19/2024 0442   BILITOT 0.3 07/30/2023 0947   GFRNONAA >60 06/19/2024 0442   GFRNONAA 46 (L) 01/26/2021 1604   GFRAA 54 (L) 01/26/2021 1604   Lab Results  Component  Value Date   CHOL 170 06/20/2024   HDL 48 06/20/2024   LDLCALC 98 06/20/2024   TRIG 119 06/20/2024   CHOLHDL 3.5 06/20/2024   Lab Results  Component Value Date   HGBA1C 5.1 06/20/2024   Lab Results  Component Value Date   VITAMINB12 328 06/20/2024   Lab Results  Component Value Date   TSH 2.705 06/20/2024      ASSESSMENT AND PLAN 58 y.o. year old female  has a past medical history of Allergy, CMV retinitis (HCC), COVID-19, DISEASE, HYPERTENSIVE HEART, BENIGN, W/HF (11/07/2006), GERD (gastroesophageal reflux disease), HIV (human immunodeficiency virus infection) (HCC), HIV infection (HCC), Hyperlipidemia, and Hypertension. here with ***     Continue {anticoagulants:31417}  and ***  for secondary stroke prevention.   Discussed secondary stroke prevention measures and importance of close PCP follow up for aggressive stroke risk factor management. I have gone over the pathophysiology of stroke, warning signs and symptoms, risk factors and their management in some detail with instructions to go to the closest emergency room for symptoms of concern. HTN: BP goal <130/90.  Stable on *** per PCP HLD: LDL goal <70. Recent LDL ***.  DMII: A1c goal<7.0. Recent A1c ***.  Encouraged patient to monitor diet and encouraged exercise FU with our office ***  No orders of the defined types were placed in this encounter.  No orders of the defined types were placed in this encounter.     Duwaine Russell, MSN, NP-C 07/22/2024, 5:02 PM [facility] [address] [place], [place] [address] [phone]      [1]  Allergies Allergen Reactions   Dapsone Shortness Of Breath   Amoxicillin Rash   Nitrofurantoin  Nausea And Vomiting and Rash

## 2024-07-23 ENCOUNTER — Ambulatory Visit: Admitting: Adult Health

## 2024-07-23 ENCOUNTER — Encounter: Payer: Self-pay | Admitting: Adult Health

## 2024-07-23 VITALS — BP 114/74 | HR 75 | Ht 65.0 in | Wt 151.0 lb

## 2024-07-23 DIAGNOSIS — I639 Cerebral infarction, unspecified: Secondary | ICD-10-CM

## 2024-07-23 DIAGNOSIS — E782 Mixed hyperlipidemia: Secondary | ICD-10-CM

## 2024-07-23 NOTE — Patient Instructions (Addendum)
 Your Plan:  Continue Aspirin  81 mg daily   Blood pressure goal <130/90 Cholesterol LDL goal <70 Diabetes goal A1c <7 Monitor diet and try to exercise  Repeat CT angiogram of the head at the next visit  Thank you for coming to see us  at [facility]. I hope we have been able to provide you high quality care today.  You may receive a patient satisfaction survey over the next few weeks. We would appreciate your feedback and comments so that we may continue to improve ourselves and the health of our patients.

## 2024-07-27 NOTE — Progress Notes (Signed)
 I agree with the above plan

## 2024-07-29 ENCOUNTER — Other Ambulatory Visit (HOSPITAL_COMMUNITY): Payer: Self-pay

## 2024-07-29 ENCOUNTER — Other Ambulatory Visit: Payer: Self-pay

## 2024-07-31 ENCOUNTER — Other Ambulatory Visit: Payer: Self-pay

## 2024-07-31 ENCOUNTER — Other Ambulatory Visit (HOSPITAL_COMMUNITY): Payer: Self-pay

## 2024-07-31 NOTE — Progress Notes (Signed)
 Specialty Pharmacy Refill Coordination Note  Cynthia [name] is a 58 y.o. female contacted today regarding refills of specialty medication(s) Doravirine  (PIFELTRO ); Emtricitabine -Tenofovir  AF (Descovy )   Patient requested Pickup at [facility] at Start date: 08/03/24   Medication will be filled on: 07/31/24

## 2024-08-03 ENCOUNTER — Ambulatory Visit: Payer: Commercial Managed Care - PPO | Admitting: Nurse Practitioner

## 2024-08-03 ENCOUNTER — Encounter: Payer: Self-pay | Admitting: Nurse Practitioner

## 2024-08-03 ENCOUNTER — Other Ambulatory Visit (HOSPITAL_COMMUNITY): Payer: Self-pay

## 2024-08-03 VITALS — BP 118/72 | HR 73 | Ht 66.0 in | Wt 152.8 lb

## 2024-08-03 DIAGNOSIS — E785 Hyperlipidemia, unspecified: Secondary | ICD-10-CM | POA: Diagnosis not present

## 2024-08-03 DIAGNOSIS — F418 Other specified anxiety disorders: Secondary | ICD-10-CM | POA: Diagnosis not present

## 2024-08-03 DIAGNOSIS — I10 Essential (primary) hypertension: Secondary | ICD-10-CM | POA: Diagnosis not present

## 2024-08-03 DIAGNOSIS — Z0000 Encounter for general adult medical examination without abnormal findings: Secondary | ICD-10-CM | POA: Diagnosis not present

## 2024-08-03 DIAGNOSIS — E559 Vitamin D deficiency, unspecified: Secondary | ICD-10-CM | POA: Diagnosis not present

## 2024-08-03 DIAGNOSIS — L821 Other seborrheic keratosis: Secondary | ICD-10-CM | POA: Insufficient documentation

## 2024-08-03 DIAGNOSIS — D225 Melanocytic nevi of trunk: Secondary | ICD-10-CM | POA: Insufficient documentation

## 2024-08-03 MED ORDER — VITAMIN D 50 MCG (2000 UT) PO TABS
2000.0000 [IU] | ORAL_TABLET | Freq: Every day | ORAL | 3 refills | Status: AC
  Filled 2024-08-03: qty 90, 90d supply, fill #0

## 2024-08-03 MED ORDER — ASPIRIN 81 MG PO TBEC
81.0000 mg | DELAYED_RELEASE_TABLET | Freq: Every day | ORAL | 3 refills | Status: AC
  Filled 2024-08-03: qty 90, 90d supply, fill #0

## 2024-08-03 MED ORDER — LISINOPRIL 10 MG PO TABS
10.0000 mg | ORAL_TABLET | Freq: Every day | ORAL | 3 refills | Status: AC
  Filled 2024-08-03: qty 90, 90d supply, fill #0

## 2024-08-03 MED ORDER — ROSUVASTATIN CALCIUM 20 MG PO TABS
20.0000 mg | ORAL_TABLET | Freq: Every day | ORAL | 3 refills | Status: AC
  Filled 2024-08-03: qty 90, 90d supply, fill #0

## 2024-08-03 NOTE — Assessment & Plan Note (Addendum)
 Managed with rosuvastatin . - Continue rosuvastatin  as prescribed. Orders:   rosuvastatin  (CRESTOR ) 20 MG tablet; Take 1 tablet (20 mg total) by mouth daily.   aspirin  EC 81 MG tablet; Take 1 tablet (81 mg total) by mouth daily. Swallow whole.   lisinopril  (ZESTRIL ) 10 MG tablet; Take 1 tablet (10 mg total) by mouth daily.

## 2024-08-03 NOTE — Assessment & Plan Note (Addendum)
 Hypertension is well-controlled with lisinopril . Blood pressure readings are stable. - Continue lisinopril  as prescribed.  Orders:   rosuvastatin  (CRESTOR ) 20 MG tablet; Take 1 tablet (20 mg total) by mouth daily.   aspirin  EC 81 MG tablet; Take 1 tablet (81 mg total) by mouth daily. Swallow whole.   lisinopril  (ZESTRIL ) 10 MG tablet; Take 1 tablet (10 mg total) by mouth daily.

## 2024-08-03 NOTE — Progress Notes (Signed)
 "  Cynthia Doing, DNP, AGNP-c [facility] [address] [place], [place] [address] Main Office [phone] VISIT TYPE: CPE on 08/03/2024 Today's Vitals   08/03/24 0947  BP: 118/72  Pulse: 73  Weight: 152 lb 12.8 oz (69.3 kg)  Height: 5' 6 (1.676 m)   Body mass index is 24.66 kg/m.  Wt Readings from Last 3 Encounters:  08/03/24 152 lb 12.8 oz (69.3 kg)  07/23/24 151 lb (68.5 kg)  06/25/24 151 lb (68.5 kg)     Subjective:  Annual Exam (CPE, already had labs, pt. Stopped Lexapro  because it gave her diarrhea, )  History of Present Illness Cynthia [name] is a 58 year old female who presents for her annual exam.   She has a history of recent stroke and is currently [name] well. She is no longer taking Plavix , which was prescribed for twenty days, but continues to take baby aspirin . Her labs are up to date from the recent hospitalization.   No current issues with hearing or vision. No fullness in her throat or difficulty swallowing. No palpitations, shortness of breath, dizziness, or swelling in her feet or ankles. She experiences occasional joint pain when getting up from the couch, but it resolves once she starts moving.  She notes a small skin spot on the right lateral lower leg, identified by her dermatologist as a bite, present for about a month. It does not itch, but there is some redness in the area. It currently is not causing any concern.   She experienced diarrhea after taking Lexapro  for five days and decided to discontinue it. Her mood is stable without the medication.  She is physically active, having returned to spin class and regularly walking her dogs on trails near her home. Her spin class instructor was present when she had her stroke and is supportive of her return to class.  She is currently taking lisinopril  for hypertension and has discontinued HCTZ in the past due to stable blood pressure readings.  Pertinent items are noted in HPI.      08/03/2024    9:46 AM 04/30/2024    3:43 PM 01/02/2024   11:36 AM 07/30/2023    8:47 AM 04/16/2023    3:04 PM  Depression screen PHQ 2/9  Decreased Interest 0 0 0 0 0  Down, Depressed, Hopeless 0 0 0 0 0  PHQ - 2 Score 0 0 0 0 0  Altered sleeping  0     Tired, decreased energy  0     Change in appetite  0     Feeling bad or failure about yourself   0     Trouble concentrating  0     Moving slowly or fidgety/restless  0     Suicidal thoughts  0     PHQ-9 Score  0         Data saved with a previous flowsheet row definition       04/30/2024    3:43 PM 06/23/2021    1:48 PM  GAD 7 : Generalized Anxiety Score  Nervous, Anxious, on Edge 0 0  Control/stop worrying 0 1  Worry too much - different things 0 1  Trouble relaxing 0 0  Restless 0 0  Easily annoyed or irritable 0 1  Afraid - awful might happen 0 0  Total GAD 7 Score 0 3  Anxiety Difficulty  Not difficult at all       08/03/2024    9:46 AM 04/30/2024  3:42 PM 01/02/2024   11:35 AM 07/30/2023    8:47 AM 04/16/2023    3:04 PM  Fall Risk   Falls in the past year? 0 0 0 0 0  Number falls in past yr: 0  0 0 0  Injury with Fall? 0  0  0  0   Risk for fall due to : No Fall Risks No Fall Risks No Fall Risks    Follow up Falls evaluation completed Falls evaluation completed Falls evaluation completed Falls evaluation completed      Data saved with a previous flowsheet row definition   Past medical history, surgical history, medications, allergies, family history and social history reviewed with patient today and changes made to appropriate areas of the chart.      Objective:    Physical Exam Vitals and nursing note reviewed.  Constitutional:      General: She is not in acute distress.    Appearance: Normal appearance.  HENT:     Head: Normocephalic and atraumatic.     Right Ear: Hearing, tympanic membrane, ear canal and external ear normal.     Left Ear: Hearing, tympanic membrane, ear canal and external ear  normal.     Nose: Nose normal.     Right Sinus: No maxillary sinus tenderness or frontal sinus tenderness.     Left Sinus: No maxillary sinus tenderness or frontal sinus tenderness.     Mouth/Throat:     Lips: Pink.     Mouth: Mucous membranes are moist.     Pharynx: Oropharynx is clear.  Eyes:     General: Lids are normal. Vision grossly intact.     Extraocular Movements: Extraocular movements intact.     Conjunctiva/sclera: Conjunctivae normal.     Pupils: Pupils are equal, round, and reactive to light.     Funduscopic exam:    Right eye: Red reflex present.        Left eye: Red reflex present.    Visual Fields: Right eye visual fields normal and left eye visual fields normal.  Neck:     Thyroid : No thyromegaly.     Vascular: No carotid bruit.  Cardiovascular:     Rate and Rhythm: Normal rate and regular rhythm.     Chest Wall: PMI is not displaced.     Pulses: Normal pulses.          Dorsalis pedis pulses are 2+ on the right side and 2+ on the left side.       Posterior tibial pulses are 2+ on the right side and 2+ on the left side.     Heart sounds: Normal heart sounds. No murmur heard. Pulmonary:     Effort: Pulmonary effort is normal. No respiratory distress.     Breath sounds: Normal breath sounds.  Abdominal:     General: Abdomen is flat. Bowel sounds are normal. There is no distension.     Palpations: Abdomen is soft. There is no hepatomegaly, splenomegaly or mass.     Tenderness: There is no abdominal tenderness. There is no right CVA tenderness, left CVA tenderness, guarding or rebound.  Musculoskeletal:        General: Normal range of motion.     Cervical back: Full passive range of motion without pain, normal range of motion and neck supple. No tenderness.     Right lower leg: No edema.     Left lower leg: No edema.  Feet:     Left foot:  Toenail Condition: Left toenails are normal.  Lymphadenopathy:     Cervical: No cervical adenopathy.     Upper Body:      Right upper body: No supraclavicular adenopathy.     Left upper body: No supraclavicular adenopathy.  Skin:    General: Skin is warm and dry.     Capillary Refill: Capillary refill takes less than 2 seconds.     Nails: There is no clubbing.  Neurological:     General: No focal deficit present.     Mental Status: She is alert and oriented to person, place, and time.     GCS: GCS eye subscore is 4. GCS verbal subscore is 5. GCS motor subscore is 6.     Sensory: Sensation is intact.     Motor: Motor function is intact.     Coordination: Coordination is intact.     Gait: Gait is intact.     Deep Tendon Reflexes: Reflexes are normal and symmetric.  Psychiatric:        Attention and Perception: Attention normal.        Mood and Affect: Mood normal.        Speech: Speech normal.        Behavior: Behavior normal. Behavior is cooperative.        Thought Content: Thought content normal.        Cognition and Memory: Cognition and memory normal.        Judgment: Judgment normal.         Assessment & Plan:   Assessment & Plan Encounter for annual physical exam CPE completed today. Review of HM activities and recommendations discussed and provided on AVS. Anticipatory guidance, diet, and exercise recommendations provided. Medications, allergies, and hx reviewed and updated as necessary. Engages in regular physical activity including spin class and walking. No concerns with hearing or vision. No significant changes in skin or bowel habits. B12 levels slightly low but not requiring injections, discussed oral replacement today. Orders placed as listed below.  Plan: - Labs ordered. Will make changes as necessary based on results.  - I will review these results and send recommendations via MyChart or a telephone call.  - F/U with CPE in 1 year or sooner for acute/chronic health needs as directed.   Orders:   rosuvastatin  (CRESTOR ) 20 MG tablet; Take 1 tablet (20 mg total) by mouth daily.    lisinopril  (ZESTRIL ) 10 MG tablet; Take 1 tablet (10 mg total) by mouth daily.  Primary hypertension Hypertension is well-controlled with lisinopril . Blood pressure readings are stable. - Continue lisinopril  as prescribed.  Orders:   rosuvastatin  (CRESTOR ) 20 MG tablet; Take 1 tablet (20 mg total) by mouth daily.   aspirin  EC 81 MG tablet; Take 1 tablet (81 mg total) by mouth daily. Swallow whole.   lisinopril  (ZESTRIL ) 10 MG tablet; Take 1 tablet (10 mg total) by mouth daily.  Hyperlipidemia, unspecified hyperlipidemia type Managed with rosuvastatin . - Continue rosuvastatin  as prescribed. Orders:   rosuvastatin  (CRESTOR ) 20 MG tablet; Take 1 tablet (20 mg total) by mouth daily.   aspirin  EC 81 MG tablet; Take 1 tablet (81 mg total) by mouth daily. Swallow whole.   lisinopril  (ZESTRIL ) 10 MG tablet; Take 1 tablet (10 mg total) by mouth daily.  Health care maintenance  Orders:   rosuvastatin  (CRESTOR ) 20 MG tablet; Take 1 tablet (20 mg total) by mouth daily.   lisinopril  (ZESTRIL ) 10 MG tablet; Take 1 tablet (10 mg total) by mouth daily.  Vitamin  D deficiency Currently taking 2000IU daily for management. Labs have been stable.  Orders:   Cholecalciferol  (VITAMIN D ) 50 MCG (2000 UT) tablet; Take 1 tablet (2,000 Units total) by mouth daily.  Other specified anxiety disorders Previously managed with Lexapro , which was discontinued due to diarrhea. Mood is currently well-managed without medication. - Monitor mood and report any changes.    NEXT PREVENTATIVE PHYSICAL DUE IN 1 YEAR.    PATIENT COUNSELING PROVIDED FOR ALL ADULT PATIENTS: A well balanced diet low in saturated fats, cholesterol, and moderation in carbohydrates.  This can be as simple as monitoring portion sizes and cutting back on sugary beverages such as soda and juice to start with.    Daily water consumption of at least 64 ounces.  Physical activity at least 180 minutes per week.  If just starting out, start 10  minutes a day and work your way up.   This can be as simple as taking the stairs instead of the elevator and walking 2-3 laps around the office  purposefully every day.   STD protection, partner selection, and regular testing if high risk.  Limited consumption of alcoholic beverages if alcohol is consumed. For men, I recommend no more than 14 alcoholic beverages per week, spread out throughout the week (max 2 per day). Avoid binge drinking or consuming large quantities of alcohol in one setting.  Please let me know if you feel you may need help with reduction or quitting alcohol consumption.   Avoidance of nicotine, if used. Please let me know if you feel you may need help with reduction or quitting nicotine use.   Daily mental health attention. This can be in the form of 5 minute daily meditation, prayer, journaling, yoga, reflection, etc.  Purposeful attention to your emotions and mental state can significantly improve your overall wellbeing  and  Health.  Please know that I am here to help you with all of your health care goals and am happy to work with you to find a solution that works best for you.  The greatest advice I have received with any changes in life are to take it one step at a time, that even means if all you can focus on is the next 60 seconds, then do that and celebrate your victories.  With any changes in life, you will have set backs, and that is OK. The important thing to remember is, if you have a set back, it is not a failure, it is an opportunity to try again! Screening Testing Mammogram Every 1 -2 years based on history and risk factors Starting at age 44 Pap Smear Ages 21-39 every 3 years Ages 40-65 every 5 years with HPV testing More frequent testing may be required based on results and history Colon Cancer Screening Every 1-10 years based on test performed, risk factors, and history Starting at age 59 Bone Density Screening Every 2-10 years based on  history Starting at age 66 for women Recommendations for men differ based on medication usage, history, and risk factors AAA Screening One time ultrasound Men 60-27 years old who have every smoked Lung Cancer Screening Low Dose Lung CT every 12 months Age 81-80 years with a 30 pack-year smoking history who still smoke or who have quit within the last 15 years   Screening Labs Routine  Labs: Complete Blood Count (CBC), Complete Metabolic Panel (CMP), Cholesterol (Lipid Panel) Every 6-12 months based on history and medications May be recommended more frequently based on current conditions  or previous results Hemoglobin A1c Lab Every 3-12 months based on history and previous results Starting at age 16 or earlier with diagnosis of diabetes, high cholesterol, BMI >26, and/or risk factors Frequent monitoring for patients with diabetes to ensure blood sugar control Thyroid  Panel (TSH) Every 6 months based on history, symptoms, and risk factors May be repeated more often if on medication HIV One time testing for all patients 62 and older May be repeated more frequently for patients with increased risk factors or exposure Hepatitis C One time testing for all patients 16 and older May be repeated more frequently for patients with increased risk factors or exposure Gonorrhea, Chlamydia Every 12 months for all sexually active persons 13-24 years Additional monitoring may be recommended for those who are considered high risk or who have symptoms Every 12 months for any woman on birth control, regardless of sexual activity PSA Men 44-53 years old with risk factors Additional screening may be recommended from age 69-69 based on risk factors, symptoms, and history  Vaccine Recommendations Tetanus Booster All adults every 10 years Flu Vaccine All patients 6 months and older every year COVID Vaccine All patients 12 years and older Initial dosing with booster May recommend additional booster  based on age and health history HPV Vaccine 2 doses all patients age 5-26 Dosing may be considered for patients over 26 Shingles Vaccine (Shingrix) 2 doses all adults 55 years and older Pneumonia (Pneumovax 65) All adults 65 years and older May recommend earlier dosing based on health history One year apart from Prevnar 26 Pneumonia (Prevnar 64) All adults 65 years and older Dosed 1 year after Pneumovax 23 Pneumonia (Prevnar 20) One time alternative to the two dosing of 13 and 23 For all adults with initial dose of 23, 20 is recommended 1 year later For all adults with initial dose of 13, 23 is still recommended as second option 1 year later        "

## 2024-08-03 NOTE — Assessment & Plan Note (Addendum)
 Previously managed with Lexapro , which was discontinued due to diarrhea. Mood is currently well-managed without medication. - Monitor mood and report any changes.

## 2024-08-03 NOTE — Patient Instructions (Addendum)
 You can consider taking a B12 supplement of 250mcg once a day. Your B12 was in the normal range, but on the lower end. Sometimes a little boost of this can help with energy.    Keep up the great work with exercise!! You look amazing today! I will see if we can get the information about the labs from neurology.    For all adult patients, I recommend A well balanced diet low in saturated fats, cholesterol, and moderation in carbohydrates.   This can be as simple as monitoring portion sizes and cutting back on sugary beverages such as soda and juice to start with.    Daily water consumption of at least 64 ounces.  Physical activity at least 180 minutes per week, if just starting out.   This can be as simple as taking the stairs instead of the elevator and walking 2-3 laps around the office  purposefully every day.   STD protection, partner selection, and regular testing if high risk.  Limited consumption of alcoholic beverages if alcohol is consumed.  For women, I recommend no more than 7 alcoholic beverages per week, spread out throughout the week.  Avoid binge drinking or consuming large quantities of alcohol in one setting.   Please let me know if you feel you may need help with reduction or quitting alcohol consumption.   Avoidance of nicotine, if used.  Please let me know if you feel you may need help with reduction or quitting nicotine use.   Daily mental health attention.  This can be in the form of 5 minute daily meditation, prayer, journaling, yoga, reflection, etc.   Purposeful attention to your emotions and mental state can significantly improve your overall wellbeing  and  Health.  Please know that I am here to help you with all of your health care goals and am happy to work with you to find a solution that works best for you.  The greatest advice I have received with any changes in life are to take it one step at a time, that even means if all you can focus on is the next  60 seconds, then do that and celebrate your victories.  With any changes in life, you will have set backs, and that is OK. The important thing to remember is, if you have a set back, it is not a failure, it is an opportunity to try again!  Health Maintenance Recommendations Screening Testing Mammogram Every 1 -2 years based on history and risk factors Starting at age 73 Pap Smear Ages 21-39 every 3 years Ages 56-65 every 5 years with HPV testing More frequent testing may be required based on results and history Colon Cancer Screening Every 1-10 years based on test performed, risk factors, and history Starting at age 8 Bone Density Screening Every 2-10 years based on history Starting at age 64 for women Recommendations for men differ based on medication usage, history, and risk factors AAA Screening One time ultrasound Men 31-71 years old who have every smoked Lung Cancer Screening Low Dose Lung CT every 12 months Age 27-80 years with a 30 pack-year smoking history who still smoke or who have quit within the last 15 years  Screening Labs Routine  Labs: Complete Blood Count (CBC), Complete Metabolic Panel (CMP), Cholesterol (Lipid Panel) Every 6-12 months based on history and medications May be recommended more frequently based on current conditions or previous results Hemoglobin A1c Lab Every 3-12 months based on history and previous results Starting  at age 55 or earlier with diagnosis of diabetes, high cholesterol, BMI >26, and/or risk factors Frequent monitoring for patients with diabetes to ensure blood sugar control Thyroid  Panel (TSH w/ T3 & T4) Every 6 months based on history, symptoms, and risk factors May be repeated more often if on medication HIV One time testing for all patients 63 and older May be repeated more frequently for patients with increased risk factors or exposure Hepatitis C One time testing for all patients 30 and older May be repeated more frequently  for patients with increased risk factors or exposure Gonorrhea, Chlamydia Every 12 months for all sexually active persons 13-24 years Additional monitoring may be recommended for those who are considered high risk or who have symptoms PSA Men 63-21 years old with risk factors Additional screening may be recommended from age 22-69 based on risk factors, symptoms, and history  Vaccine Recommendations Tetanus Booster All adults every 10 years Flu Vaccine All patients 6 months and older every year COVID Vaccine All patients 12 years and older Initial dosing with booster May recommend additional booster based on age and health history HPV Vaccine 2 doses all patients age 65-26 Dosing may be considered for patients over 26 Shingles Vaccine (Shingrix) 2 doses all adults 55 years and older Pneumonia (Pneumovax 23) All adults 65 years and older May recommend earlier dosing based on health history Pneumonia (Prevnar 5) All adults 65 years and older Dosed 1 year after Pneumovax 23  Additional Screening, Testing, and Vaccinations may be recommended on an individualized basis based on family history, health history, risk factors, and/or exposure.

## 2024-08-03 NOTE — Assessment & Plan Note (Addendum)
 CPE completed today. Review of HM activities and recommendations discussed and provided on AVS. Anticipatory guidance, diet, and exercise recommendations provided. Medications, allergies, and hx reviewed and updated as necessary. Engages in regular physical activity including spin class and walking. No concerns with hearing or vision. No significant changes in skin or bowel habits. B12 levels slightly low but not requiring injections, discussed oral replacement today. Orders placed as listed below.  Plan: - Labs ordered. Will make changes as necessary based on results.  - I will review these results and send recommendations via MyChart or a telephone call.  - F/U with CPE in 1 year or sooner for acute/chronic health needs as directed.   Orders:   rosuvastatin  (CRESTOR ) 20 MG tablet; Take 1 tablet (20 mg total) by mouth daily.   lisinopril  (ZESTRIL ) 10 MG tablet; Take 1 tablet (10 mg total) by mouth daily.

## 2024-08-03 NOTE — Assessment & Plan Note (Addendum)
 Currently taking 2000IU daily for management. Labs have been stable.  Orders:   Cholecalciferol  (VITAMIN D ) 50 MCG (2000 UT) tablet; Take 1 tablet (2,000 Units total) by mouth daily.

## 2024-08-06 LAB — HYPERCOAGULABLE PANEL, COMPREHENSIVE
APTT: 25 s
AT III Act/Nor PPP Chro: 107 %
Act. Prt C Resist w/FV Defic.: 2.7 ratio
Anticardiolipin Ab, IgG: <10 [GPL'U]
Anticardiolipin Ab, IgM: <10 [MPL'U]
Beta-2 Glycoprotein I, IgA: <10 SAU
Beta-2 Glycoprotein I, IgG: <10 SGU
Beta-2 Glycoprotein I, IgM: <10 SMU
DRVVT Screen Seconds: 34 s
Factor VII Antigen**: 170 %
Factor VIII Activity: 177 % — ABNORMAL HIGH
Hexagonal Phospholipid Neutral: 4 s
Homocysteine: 10.1 umol/L
Prot C Ag Act/Nor PPP Imm: 143 %
Prot S Ag Act/Nor PPP Imm: 112 %
Protein C Ag/FVII Ag Ratio**: 0.8 ratio
Protein S Ag/FVII Ag Ratio**: 0.7 ratio

## 2024-08-11 DIAGNOSIS — R791 Abnormal coagulation profile: Secondary | ICD-10-CM

## 2024-08-19 ENCOUNTER — Other Ambulatory Visit (HOSPITAL_COMMUNITY): Payer: Self-pay

## 2024-08-26 ENCOUNTER — Other Ambulatory Visit: Payer: Self-pay

## 2024-08-26 ENCOUNTER — Other Ambulatory Visit (HOSPITAL_COMMUNITY): Payer: Self-pay

## 2024-08-26 ENCOUNTER — Other Ambulatory Visit: Payer: Self-pay | Admitting: Pharmacy Technician

## 2024-08-26 ENCOUNTER — Telehealth: Payer: Self-pay

## 2024-08-26 NOTE — Telephone Encounter (Signed)
 [facility] Patient Advocate Encounter   Was successful in obtaining a [employer] copay card for PIFELTRO .  This copay card will make the patients copay $0.  I have spoken with the patient.    The billing information is as follows and has been shared with [facility].  RxBin: [id number] PCN: [id number] Member ID: [id number] Group ID: [id number]    Charmaine Sharps, CPhT Specialty Pharmacy Patient [facility] Phone: [phone] Fax:  [phone]

## 2024-08-27 ENCOUNTER — Other Ambulatory Visit: Payer: Self-pay

## 2024-08-28 ENCOUNTER — Other Ambulatory Visit: Payer: Self-pay

## 2024-08-28 ENCOUNTER — Encounter (INDEPENDENT_AMBULATORY_CARE_PROVIDER_SITE_OTHER): Payer: Self-pay

## 2024-08-28 NOTE — Progress Notes (Signed)
 Specialty Pharmacy Refill Coordination Note  Cynthia [name] is a 59 y.o. female contacted today regarding refills of specialty medication(s) Doravirine  (PIFELTRO ); Emtricitabine -Tenofovir  AF (Descovy )   Patient requested (Patient-Rptd) Pickup at [facility] date: (Patient-Rptd) 09/02/24   Medication will be filled on: 08/28/24

## 2024-09-11 ENCOUNTER — Telehealth: Payer: Self-pay

## 2024-09-11 ENCOUNTER — Telehealth: Payer: Self-pay | Admitting: Oncology

## 2024-09-11 NOTE — Telephone Encounter (Signed)
 Patient called stating that she is a new patient referral and needed to move her appointment to a later date in March, stated would call back to speak with scheduler.

## 2024-09-11 NOTE — Telephone Encounter (Signed)
 PT  called to reschedule appt; reason unspecified.

## 2024-09-14 ENCOUNTER — Inpatient Hospital Stay

## 2024-09-14 ENCOUNTER — Inpatient Hospital Stay: Admitting: Oncology

## 2024-10-06 ENCOUNTER — Inpatient Hospital Stay

## 2024-10-06 ENCOUNTER — Inpatient Hospital Stay: Admitting: Oncology

## 2025-03-29 ENCOUNTER — Ambulatory Visit: Admitting: Internal Medicine

## 2025-06-16 ENCOUNTER — Ambulatory Visit: Admitting: Adult Health
